# Patient Record
Sex: Male | Born: 1965 | Race: White | Hispanic: No | Marital: Single | Smoking: Never smoker
Health system: Southern US, Community
[De-identification: ages and names within clinical notes are randomized; demographics above are authoritative.]

## PROBLEM LIST (undated history)

## (undated) DIAGNOSIS — E785 Hyperlipidemia, unspecified: Secondary | ICD-10-CM

## (undated) DIAGNOSIS — I10 Essential (primary) hypertension: Secondary | ICD-10-CM

## (undated) DIAGNOSIS — H3563 Retinal hemorrhage, bilateral: Secondary | ICD-10-CM

## (undated) DIAGNOSIS — E119 Type 2 diabetes mellitus without complications: Secondary | ICD-10-CM

## (undated) DIAGNOSIS — K635 Polyp of colon: Principal | ICD-10-CM

## (undated) DIAGNOSIS — E669 Obesity, unspecified: Secondary | ICD-10-CM

## (undated) HISTORY — DX: Polyp of colon: K63.5

## (undated) HISTORY — DX: Retinal hemorrhage, bilateral: H35.63

## (undated) HISTORY — DX: Essential (primary) hypertension: I10

## (undated) HISTORY — DX: Obesity, unspecified: E66.9

## (undated) HISTORY — DX: Type 2 diabetes mellitus without complications: E11.9

## (undated) HISTORY — DX: Hyperlipidemia, unspecified: E78.5

---

## 2013-12-04 ENCOUNTER — Ambulatory Visit (INDEPENDENT_AMBULATORY_CARE_PROVIDER_SITE_OTHER): Payer: BC Managed Care – PPO | Admitting: Family Medicine

## 2013-12-04 ENCOUNTER — Encounter: Payer: Self-pay | Admitting: Family Medicine

## 2013-12-04 VITALS — BP 121/74 | HR 77 | Resp 16 | Ht 68.0 in | Wt 265.0 lb

## 2013-12-04 DIAGNOSIS — R110 Nausea: Secondary | ICD-10-CM

## 2013-12-04 DIAGNOSIS — I10 Essential (primary) hypertension: Secondary | ICD-10-CM

## 2013-12-04 DIAGNOSIS — IMO0001 Reserved for inherently not codable concepts without codable children: Secondary | ICD-10-CM

## 2013-12-04 DIAGNOSIS — E785 Hyperlipidemia, unspecified: Secondary | ICD-10-CM

## 2013-12-04 DIAGNOSIS — E1165 Type 2 diabetes mellitus with hyperglycemia: Secondary | ICD-10-CM

## 2013-12-04 MED ORDER — IRBESARTAN 150 MG PO TABS: 150.0000 mg | ORAL_TABLET | Freq: Every day | ORAL | Status: DC

## 2013-12-04 MED ORDER — PIOGLITAZONE HCL 30 MG PO TABS: 30.0000 mg | ORAL_TABLET | Freq: Every day | ORAL | Status: DC

## 2013-12-04 MED ORDER — ROSUVASTATIN CALCIUM 20 MG PO TABS: 20.0000 mg | ORAL_TABLET | Freq: Every day | ORAL | Status: DC

## 2013-12-04 MED ORDER — LIRAGLUTIDE 18 MG/3ML ~~LOC~~ SOPN: 1.8000 mg | PEN_INJECTOR | Freq: Every day | SUBCUTANEOUS | Status: DC

## 2013-12-04 MED ORDER — PROMETHAZINE HCL 12.5 MG PO TABS: ORAL_TABLET | ORAL | Status: DC

## 2013-12-04 MED ORDER — CANAGLIFLOZIN-METFORMIN HCL 150-1000 MG PO TABS: 1.0000 | ORAL_TABLET | Freq: Two times a day (BID) | ORAL | Status: DC

## 2013-12-04 MED ORDER — INSULIN PEN NEEDLE 32G X 6 MM MISC: 1.0000 "pen " | Freq: Every day | Status: AC

## 2013-12-04 NOTE — Progress Notes (Signed)
Subjective:    Patient ID: Edward Riley, male    DOB: 13-Aug-1966, 48 y.o.   MRN: [id number]  HPI  Edward Riley is here today to establish care with our practice.  He was referred to [place] by his friends [facility]).  He used to receive care by Dr. Cooper Render at the [facility].  He was dissatisfied with the care he was receiving and decided to look for another PCP.  He would like to discuss the conditions listed below:   1)  Type II DM:  He currently takes metformin (1000 mg, twice daily), glimepiride (4 mg, twice daily) and Lantus (up to 30 units daily).  His last A1c was 11 about three months.  He checks his sugars about three times daily. They usually range between 92-160 mg/dL.    2)  Hyperlipidemia:  His cholesterol levels have been abnormal.  He is currently on atorvastatin 40 mg.  His cholesterol levels have not been checked in over a year.  He does not take his medicine regularly because it gives him joint pain which is worse in his fingers.    3)  ED:  He also has experienced erectile dysfunction on occasions.     Review of Systems  Constitutional: Positive for fatigue and unexpected weight change. Negative for activity change and appetite change.  HENT: Negative.   Eyes: Negative.   Respiratory: Negative for cough, chest tightness and shortness of breath.   Cardiovascular: Negative for chest pain, palpitations and leg swelling.  Gastrointestinal: Negative for abdominal pain, diarrhea, constipation and blood in stool.  Endocrine: Negative for polydipsia, polyphagia and polyuria.  Genitourinary: Negative for difficulty urinating.  Musculoskeletal: Positive for myalgias. Negative for arthralgias and joint swelling.  Skin: Negative for rash.  Neurological: Negative for dizziness, light-headedness, numbness and headaches.  Hematological: Negative.   Psychiatric/Behavioral: Negative.     Past Medical History  Diagnosis Date  . Hyperlipidemia   . Diabetes mellitus without  complication   . Obese   . Hypertension   . Retinal hemorrhage, both eyes      History reviewed. No pertinent past surgical history.   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 Arthritis Mother   . Hyperlipidemia Mother   . Hypertension Mother   . Cancer Father 76    Prostate (Metastatic) Lung, Esophagus, Stomach  . Alcohol abuse Father   . Kidney disease Brother   . Hypertension Brother   . Cancer Paternal Aunt     Breast Cancer  . Alzheimer's disease Maternal Grandmother   . Heart disease Paternal Grandmother   . Diabetes Brother   . Alcohol abuse Maternal Uncle      No current outpatient prescriptions on file prior to visit.   No current facility-administered medications on file prior to visit.     No Known Allergies   Immunization History  Administered Date(s) Administered  . Influenza-Unspecified 08/27/2013  . Pneumococcal  Polysaccharide-23 11/27/2008      Objective:   Physical Exam  Constitutional: He appears well-nourished.  HENT:  Head: Normocephalic.  Nose: Nose normal.  Mouth/Throat: Oropharynx is clear and moist.  Eyes: Conjunctivae are normal. No scleral icterus.  Neck: Neck supple. No thyromegaly present.  Cardiovascular: Normal rate, regular rhythm and normal heart sounds.   Pulmonary/Chest: Effort normal and breath sounds normal.  Abdominal: Soft. He exhibits no mass. There is no tenderness.  Musculoskeletal: Normal range of motion.  Lymphadenopathy:    He has no cervical adenopathy.  Neurological: He is alert.  Skin: Skin is warm and dry. No rash noted.    Psychiatric: He has a normal mood and affect. His behavior is normal. Judgment and thought content normal.       Assessment & Plan:    Edward Riley was seen today for establish care.  Diagnoses and associated orders for this visit:  Other and unspecified hyperlipidemia - rosuvastatin (CRESTOR) 20 MG tablet; Take 1 tablet (20 mg total) by mouth at bedtime.  Nausea alone - promethazine (PHENERGAN) 12.5 MG tablet; Take 1/2 - 2 po q 6 hours as needed for nausea  Type II or unspecified type diabetes mellitus without mention of complication, uncontrolled - Liraglutide 18 MG/3ML SOPN; Inject 1.8 mg into the skin at bedtime. - pioglitazone (ACTOS) 30 MG tablet; Take 1 tablet (30 mg total) by mouth daily. - Canagliflozin-Metformin HCl [id number] MG TABS; Take 1 tablet by mouth 2 (two) times daily. - Insulin Pen Needle (NOVOFINE) 32G X 6 MM MISC; Inject 1 pen into the skin at bedtime.  Essential hypertension, benign - irbesartan (AVAPRO) 150 MG tablet; Take 1 tablet (150 mg total) by mouth daily.   TIME SPENT "FACE TO FACE" WITH PATIENT -  60 MINS

## 2013-12-04 NOTE — Patient Instructions (Signed)
1)  Blood Sugar  Victoza 1.8 at bedtime Invokamet - 50/1000 twice a day for 10 days then increase to the 150/1000 Actos - 15 mg at bedtime (1/2 of the 30)  The End of Diabetes (Dr. Excell Seltzer)  Exercise (1 hour per day)   ASA - 81 mg   2)  BP - Avapro 150 mg  3)  Cholesterol - Crestor 10 mg - Sample plus 1/2 of the 20 mg  4)  Nausea - Take Phenergan 12.5 - 25 if needed for nausea  Diabetes and Standards of Medical Care  Diabetes is complicated. You may find that your diabetes team includes a dietitian, nurse, diabetes educator, eye doctor, and more. To help everyone know what is going on and to help you get the care you deserve, the following schedule of care was developed to help keep you on track. Below are the tests, exams, vaccines, medicines, education, and plans you will need. HbA1c test This test shows how well you have controlled your glucose over the past 2 3 months. It is used to see if your diabetes management plan needs to be adjusted.   It is performed at least 2 times a year if you are meeting treatment goals.  It is performed 4 times a year if therapy has changed or if you are not meeting treatment goals. Blood pressure test  This test is performed at every routine medical visit. The goal is less than 140/90 mmHg for most people, but 130/80 mmHg in some cases. Ask your health care provider about your goal. Dental exam  Follow up with the dentist regularly. Eye exam  If you are diagnosed with type 1 diabetes as a child, get an exam upon reaching the age of 25 years or older and have had diabetes for 3 5 years. Yearly eye exams are recommended after that initial eye exam.  If you are diagnosed with type 1 diabetes as an adult, get an exam within 5 years of diagnosis and then yearly.  If you are diagnosed with type 2 diabetes, get an exam as soon as possible after the diagnosis and then yearly. Foot care exam  Visual foot exams are performed at every routine  medical visit. The exams check for cuts, injuries, or other problems with the feet.  A comprehensive foot exam should be done yearly. This includes visual inspection as well as assessing foot pulses and testing for loss of sensation.  Check your feet nightly for cuts, injuries, or other problems with your feet. Tell your health care provider if anything is not healing. Kidney function test (urine microalbumin)  This test is performed once a year.  Type 1 diabetes: The first test is performed 5 years after diagnosis.  Type 2 diabetes: The first test is performed at the time of diagnosis.  A serum creatinine and estimated glomerular filtration rate (eGFR) test is done once a year to assess the level of chronic kidney disease (CKD), if present. Lipid profile (cholesterol, HDL, LDL, triglycerides)  Performed every 5 years for most people.  The goal for LDL is less than 100 mg/dL. If you are at high risk, the goal is less than 70 mg/dL.  The goal for HDL is 40 mg/dL 50 mg/dL for men and 50 mg/dL 60 mg/dL for women. An HDL cholesterol of 60 mg/dL or higher gives some protection against heart disease.  The goal for triglycerides is less than 150 mg/dL. Influenza vaccine, pneumococcal vaccine, and hepatitis B vaccine  The  influenza vaccine is recommended yearly.  The pneumococcal vaccine is generally given once in a lifetime. However, there are some instances when another vaccination is recommended. Check with your health care provider.  The hepatitis B vaccine is also recommended for adults with diabetes. Diabetes self-management education  Education is recommended at diagnosis and ongoing as needed. Treatment plan  Your treatment plan is reviewed at every medical visit. Document Released: 09/10/2009 Document Revised: 07/16/2013 Document Reviewed: 04/15/2013 [facility] Patient Information 2014 [place].

## 2013-12-08 DIAGNOSIS — E1169 Type 2 diabetes mellitus with other specified complication: Secondary | ICD-10-CM | POA: Insufficient documentation

## 2013-12-08 DIAGNOSIS — E785 Hyperlipidemia, unspecified: Secondary | ICD-10-CM | POA: Insufficient documentation

## 2013-12-08 DIAGNOSIS — I152 Hypertension secondary to endocrine disorders: Secondary | ICD-10-CM | POA: Insufficient documentation

## 2013-12-08 DIAGNOSIS — R110 Nausea: Secondary | ICD-10-CM | POA: Insufficient documentation

## 2013-12-08 DIAGNOSIS — E1159 Type 2 diabetes mellitus with other circulatory complications: Secondary | ICD-10-CM | POA: Insufficient documentation

## 2013-12-08 DIAGNOSIS — I10 Essential (primary) hypertension: Secondary | ICD-10-CM

## 2014-01-05 ENCOUNTER — Ambulatory Visit (INDEPENDENT_AMBULATORY_CARE_PROVIDER_SITE_OTHER): Payer: BC Managed Care – PPO | Admitting: Family Medicine

## 2014-01-05 ENCOUNTER — Encounter: Payer: Self-pay | Admitting: Family Medicine

## 2014-01-05 VITALS — BP 144/85 | HR 105 | Resp 16 | Wt 257.0 lb

## 2014-01-05 DIAGNOSIS — IMO0001 Reserved for inherently not codable concepts without codable children: Secondary | ICD-10-CM

## 2014-01-05 DIAGNOSIS — Z5181 Encounter for therapeutic drug level monitoring: Secondary | ICD-10-CM

## 2014-01-05 DIAGNOSIS — E1165 Type 2 diabetes mellitus with hyperglycemia: Principal | ICD-10-CM

## 2014-01-05 DIAGNOSIS — I10 Essential (primary) hypertension: Secondary | ICD-10-CM

## 2014-01-05 DIAGNOSIS — E785 Hyperlipidemia, unspecified: Secondary | ICD-10-CM

## 2014-01-05 LAB — POCT GLYCOSYLATED HEMOGLOBIN (HGB A1C): Hemoglobin A1C: 8.2

## 2014-01-05 MED ORDER — CRESTOR 40 MG PO TABS: ORAL_TABLET | ORAL | Status: DC

## 2014-01-05 MED ORDER — METOPROLOL SUCCINATE ER 50 MG PO TB24: 50.0000 mg | ORAL_TABLET | Freq: Every day | ORAL | Status: DC

## 2014-01-05 MED ORDER — NEBIVOLOL HCL 5 MG PO TABS: 5.0000 mg | ORAL_TABLET | Freq: Every day | ORAL | Status: DC

## 2014-01-05 NOTE — Progress Notes (Signed)
Subjective:    Patient ID: Edward Riley, male    DOB: 05/08/1966, 48 y.o.   MRN: [id number]  HPI  Edward Riley is here today for mediation management.    1)  Type II DM:  He is doing great with the new combination of Invokamet and Victoza.  He has also been working hard on his diet and has lost 8 lbs since his last office visit.    2)  Hypertension:  His pressure remains a little elevated today.  3)  Hyperlipidemia:  He needs a prescription for Crestor.     Review of Systems  Cardiovascular: Negative for chest pain.  Endocrine: Negative for polydipsia, polyphagia and polyuria.  Neurological: Negative for light-headedness.    Past Medical History  Diagnosis Date  . Hyperlipidemia   . Diabetes mellitus without complication   . Obese   . Hypertension   . 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 Arthritis Mother   . Hyperlipidemia Mother   . Hypertension Mother   . Cancer Father [id number]    Prostate (Metastatic) Lung, Esophagus, Stomach  . Alcohol abuse Father   . Kidney disease Brother   . Hypertension Brother   . Cancer Paternal Aunt     Breast Cancer  . Alzheimer's disease Maternal Grandmother   . Heart disease Paternal Grandmother   . Diabetes Brother   . Alcohol abuse Maternal Uncle      Current Outpatient Prescriptions on File Prior to Visit  Medication Sig Dispense Refill  . Canagliflozin-Metformin HCl [phone] MG TABS Take 1 tablet by mouth 2 (two) times daily.  60 tablet  5  . Insulin Pen Needle (NOVOFINE) 32G X 6 MM MISC Inject 1 pen into the  skin at bedtime.  100 each  11  . irbesartan (AVAPRO) 150 MG tablet Take 1 tablet (150 mg total) by mouth daily.  30 tablet  5  . Liraglutide 18 MG/3ML SOPN Inject 1.8 mg into the skin at bedtime.  3 pen  5  . lisinopril (PRINIVIL,ZESTRIL) 20 MG tablet       . ONE TOUCH ULTRA TEST test strip       . pioglitazone (ACTOS) 30 MG tablet Take 1 tablet (30 mg total) by mouth daily.  30 tablet  3  . rosuvastatin (CRESTOR) 20 MG tablet Take 1 tablet (20 mg total) by mouth at bedtime.  30 tablet  0   No current facility-administered medications on file prior to visit.     No Known Allergies   Immunization History  Administered Date(s) Administered  . Influenza-Unspecified 08/27/2013  . Pneumococcal Polysaccharide-23 11/27/2008        Objective:   Physical Exam  Constitutional: He is oriented to person, place, and time. He appears well-nourished. No distress.  HENT:  Head: Normocephalic.  Eyes: No scleral icterus.  Neck: Neck supple. No thyromegaly present.  Cardiovascular: Normal rate, regular rhythm and normal heart sounds.  Exam reveals no gallop and no friction rub.   No murmur heard. Pulmonary/Chest: Breath sounds normal. No respiratory distress. He exhibits no tenderness.  Musculoskeletal: He exhibits no edema.  Neurological: He is alert and oriented to person, place, and time.  Skin: Skin is warm and dry. No rash noted.  Psychiatric: He has a normal mood and affect. His behavior is normal. Judgment and thought content normal.          Assessment & Plan:    Beaux was seen today for diabetes.  Diagnoses and associated orders for this visit:  Type II or unspecified type diabetes mellitus without mention of complication, uncontrolled Comments: His A1c is elevated at 8.2% but this is down from his last check which was 11.  He will continue to work on diet and exercise.   - POCT glycosylated hemoglobin (Hb A1C)  Other and unspecified hyperlipidemia Comments: He is  going to remain on Crestor.  We'll check a lipid panel in 3 months.   - CRESTOR 40 MG tablet; Take 1/2 - 1 tab at bedtime  Essential hypertension, benign Comments: His BP is a little elevated on his current meds.  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  He'll take Bystolic or Toprol XL.   - metoprolol succinate (TOPROL-XL) 50 MG 24 hr tablet; Take 1 tablet (50 mg total) by mouth daily. - nebivolol (BYSTOLIC) 5 MG tablet; Take 1 tablet (5 mg total) by mouth daily.  TIME SPENT "FACE TO FACE" WITH PATIENT -  30 MINS

## 2014-01-19 ENCOUNTER — Other Ambulatory Visit: Payer: Self-pay | Admitting: Family Medicine

## 2014-01-19 ENCOUNTER — Encounter: Payer: Self-pay | Admitting: Family Medicine

## 2014-01-19 ENCOUNTER — Ambulatory Visit (INDEPENDENT_AMBULATORY_CARE_PROVIDER_SITE_OTHER): Payer: BC Managed Care – PPO | Admitting: Family Medicine

## 2014-01-19 VITALS — BP 146/85 | HR 73 | Resp 16 | Wt 259.0 lb

## 2014-01-19 DIAGNOSIS — IMO0001 Reserved for inherently not codable concepts without codable children: Secondary | ICD-10-CM

## 2014-01-19 DIAGNOSIS — R809 Proteinuria, unspecified: Secondary | ICD-10-CM

## 2014-01-19 DIAGNOSIS — Z5181 Encounter for therapeutic drug level monitoring: Secondary | ICD-10-CM

## 2014-01-19 DIAGNOSIS — I10 Essential (primary) hypertension: Secondary | ICD-10-CM

## 2014-01-19 DIAGNOSIS — E785 Hyperlipidemia, unspecified: Secondary | ICD-10-CM

## 2014-01-19 DIAGNOSIS — E1165 Type 2 diabetes mellitus with hyperglycemia: Secondary | ICD-10-CM

## 2014-01-19 LAB — POCT URINALYSIS DIPSTICK
Bilirubin, UA: NEGATIVE
Glucose, UA: 2000
Ketones, UA: 15
Leukocytes, UA: NEGATIVE
Nitrite, UA: NEGATIVE
Spec Grav, UA: 1.015
Urobilinogen, UA: NEGATIVE
pH, UA: 5

## 2014-01-19 LAB — CBC WITH DIFFERENTIAL/PLATELET
Basophils Absolute: 0 10*3/uL (ref 0.0–0.1)
Basophils Relative: 0 % (ref 0–1)
Eosinophils Absolute: 0.1 10*3/uL (ref 0.0–0.7)
Eosinophils Relative: 1 % (ref 0–5)
HCT: 42.2 % (ref 39.0–52.0)
Hemoglobin: 15.1 g/dL (ref 13.0–17.0)
Lymphocytes Relative: 17 % (ref 12–46)
Lymphs Abs: 1.3 10*3/uL (ref 0.7–4.0)
MCH: 31.2 pg (ref 26.0–34.0)
MCHC: 35.8 g/dL (ref 30.0–36.0)
MCV: 87.2 fL (ref 78.0–100.0)
Monocytes Absolute: 0.5 10*3/uL (ref 0.1–1.0)
Monocytes Relative: 7 % (ref 3–12)
Neutro Abs: 5.8 10*3/uL (ref 1.7–7.7)
Neutrophils Relative %: 75 % (ref 43–77)
Platelets: 197 10*3/uL (ref 150–400)
RBC: 4.84 MIL/uL (ref 4.22–5.81)
RDW: 13.6 % (ref 11.5–15.5)
WBC: 7.7 10*3/uL (ref 4.0–10.5)

## 2014-01-19 LAB — POCT UA - MICROALBUMIN
Albumin/Creatinine Ratio, Urine, POC: 368
Creatinine, POC: 72.6 mg/dL
Microalbumin Ur, POC: 267.2 mg/L

## 2014-01-19 MED ORDER — LISINOPRIL 20 MG PO TABS: 20.0000 mg | ORAL_TABLET | Freq: Every morning | ORAL | Status: DC

## 2014-01-19 NOTE — Progress Notes (Signed)
Subjective:    Patient ID: Edward Riley, male    DOB: 12-20-1965, 48 y.o.   MRN: [id number]  HPI  Edward Riley is here today concerned about his blood pressure. He has been taking Avapro and Bystolic every evening.  He has been checking his blood pressure and has found that his pressures are all over the place.  His pressure was also elevated when he went for his DOT physical.  He denies any headaches or dizziness. He also would like to have his urine rechecked since it showed protein and ketones when he went to get his DOT physical.    Review of Systems  Cardiovascular: Negative for chest pain, palpitations and leg swelling.  Endocrine: Negative for polydipsia, polyphagia and polyuria.  Neurological: Negative for dizziness and headaches.  All other systems reviewed and are negative.     Past Medical History  Diagnosis Date  . Hyperlipidemia   . Diabetes mellitus without complication   . Obese   . Hypertension   . Retinal hemorrhage, both eyes      History reviewed. No pertinent past surgical history.   History   Social History Narrative   Marital Status: Single    Children: None   Pets: None    Living Situation: Lives alone   Occupation: Truck Sports administrator)     Education:  12 th Grade    Tobacco Use/Exposure:  None    Alcohol Use:  Rarely   Drug Use:  None   Diet:  Regular   Exercise:  Very Active at work    Hobbies:  Teacher, early years/pre                  Family History  Problem Relation Age of Onset  . Arthritis Mother   . Hyperlipidemia Mother   . Hypertension Mother   . Cancer Father 43    Prostate (Metastatic) Lung, Esophagus, Stomach  . Alcohol abuse Father   . Kidney disease Brother   . Hypertension Brother   . Cancer Paternal Aunt     Breast Cancer  . Alzheimer's disease Maternal Grandmother   . Heart disease Paternal Grandmother   . Diabetes Brother   . Alcohol abuse Maternal Uncle      Current Outpatient Prescriptions on File  Prior to Visit  Medication Sig Dispense Refill  . Canagliflozin-Metformin HCl [phone] MG TABS Take 1 tablet by mouth 2 (two) times daily.  60 tablet  5  . CRESTOR 40 MG tablet Take 1/2 - 1 tab at bedtime  30 tablet  5  . Insulin Pen Needle (NOVOFINE) 32G X 6 MM MISC Inject 1 pen into the skin at bedtime.  100 each  11  . irbesartan (AVAPRO) 150 MG tablet Take 1 tablet (150 mg total) by mouth daily.  30 tablet  5  . Liraglutide 18 MG/3ML SOPN Inject 1.8 mg into the skin at bedtime.  3 pen  5  . metoprolol succinate (TOPROL-XL) 50 MG 24 hr tablet Take 1 tablet (50 mg total) by mouth daily.  30 tablet  5  . nebivolol (BYSTOLIC) 5 MG tablet Take 1 tablet (5 mg total) by mouth daily.  30 tablet  5  . ONE TOUCH ULTRA TEST test strip       . pioglitazone (ACTOS) 30 MG tablet Take 1 tablet (30 mg total) by mouth daily.  30 tablet  3   No current facility-administered medications on file prior to visit.     No Known  Allergies   Immunization History  Administered Date(s) Administered  . Influenza-Unspecified 08/27/2013  . Pneumococcal Polysaccharide-23 11/27/2008       Objective:   Physical Exam  Nursing note and vitals reviewed. Constitutional: He is oriented to person, place, and time. He appears well-nourished. No distress.  HENT:  Head: Normocephalic.  Eyes: No scleral icterus.  Neck: Neck supple. No thyromegaly present.  Cardiovascular: Normal rate, regular rhythm and normal heart sounds.  Exam reveals no gallop and no friction rub.   No murmur heard. Pulmonary/Chest: Breath sounds normal. No respiratory distress. He exhibits no tenderness.  Musculoskeletal: He exhibits no edema.  Neurological: He is alert and oriented to person, place, and time.  Skin: Skin is warm and dry. No rash noted.  Psychiatric: He has a normal mood and affect. His behavior is normal. Judgment and thought content normal.       Assessment & Plan:    Edward Riley was seen today for  hypertension.  Diagnoses and associated orders for this visit:  Essential hypertension, benign Comments: Adding some lisinopril to lower his BP.   - lisinopril (PRINIVIL,ZESTRIL) 20 MG tablet; Take 1 tablet (20 mg total) by mouth every morning.  Proteinuria - POCT urinalysis dipstick - POCT UA - Microalbumin  Type II or unspecified type diabetes mellitus without mention of complication, uncontrolled - Insulin, fasting - C-peptide - COMPLETE METABOLIC PANEL WITH GFR - C-peptide - Insulin, fasting  Encounter for therapeutic drug monitoring - CBC w/Diff  Other and unspecified hyperlipidemia - TSH - NMR Lipoprofile with Lipids

## 2014-01-20 LAB — NMR LIPOPROFILE WITH LIPIDS
Cholesterol, Total: 140 mg/dL (ref <200)
HDL Particle Number: 26.5 umol/L — ABNORMAL LOW (ref >=30.5)
HDL Size: 8.7 nm — ABNORMAL LOW (ref >=9.2)
HDL-C: 28 mg/dL — ABNORMAL LOW (ref >=40)
LDL (calc): 67 mg/dL (ref <100)
LDL Particle Number: 1275 nmol/L — ABNORMAL HIGH (ref <1000)
LDL Size: 19.5 nm — ABNORMAL LOW (ref >20.5)
LP-IR Score: 72 — ABNORMAL HIGH (ref <=45)
Large HDL-P: <1.3 umol/L — ABNORMAL LOW (ref >=4.8)
Large VLDL-P: 4.7 nmol/L — ABNORMAL HIGH (ref <=2.7)
Small LDL Particle Number: 1116 nmol/L — ABNORMAL HIGH (ref <=527)
Triglycerides: 227 mg/dL — ABNORMAL HIGH (ref <150)
VLDL Size: 47.5 nm — ABNORMAL HIGH (ref <=46.6)

## 2014-01-20 LAB — COMPLETE METABOLIC PANEL WITH GFR
ALT: 30 U/L (ref 0–53)
AST: 23 U/L (ref 0–37)
Albumin: 4.9 g/dL (ref 3.5–5.2)
Alkaline Phosphatase: 57 U/L (ref 39–117)
BUN: 21 mg/dL (ref 6–23)
CO2: 24 mEq/L (ref 19–32)
Calcium: 9.7 mg/dL (ref 8.4–10.5)
Chloride: 99 mEq/L (ref 96–112)
Creat: 0.85 mg/dL (ref 0.50–1.35)
GFR, Est African American: >89 mL/min
GFR, Est Non African American: >89 mL/min
Glucose, Bld: 179 mg/dL — ABNORMAL HIGH (ref 70–99)
Potassium: 4.7 mEq/L (ref 3.5–5.3)
Sodium: 138 mEq/L (ref 135–145)
Total Bilirubin: 0.6 mg/dL (ref 0.2–1.2)
Total Protein: 7.2 g/dL (ref 6.0–8.3)

## 2014-01-20 LAB — INSULIN, FASTING: Insulin fasting, serum: 6 u[IU]/mL (ref 3–28)

## 2014-01-20 LAB — C-PEPTIDE: C-Peptide: 1.64 ng/mL (ref 0.80–3.90)

## 2014-01-20 LAB — TSH: TSH: 0.687 u[IU]/mL (ref 0.350–4.500)

## 2014-03-26 ENCOUNTER — Other Ambulatory Visit: Payer: BC Managed Care – PPO

## 2014-03-26 ENCOUNTER — Ambulatory Visit: Payer: BC Managed Care – PPO | Admitting: Family Medicine

## 2014-03-28 ENCOUNTER — Encounter: Payer: Self-pay | Admitting: Family Medicine

## 2014-03-30 ENCOUNTER — Other Ambulatory Visit: Payer: BC Managed Care – PPO

## 2014-03-31 ENCOUNTER — Ambulatory Visit (INDEPENDENT_AMBULATORY_CARE_PROVIDER_SITE_OTHER): Payer: BC Managed Care – PPO | Admitting: Family Medicine

## 2014-03-31 ENCOUNTER — Encounter: Payer: Self-pay | Admitting: Family Medicine

## 2014-03-31 VITALS — BP 139/84 | HR 92 | Resp 16 | Wt 250.0 lb

## 2014-03-31 DIAGNOSIS — I10 Essential (primary) hypertension: Secondary | ICD-10-CM

## 2014-03-31 DIAGNOSIS — E119 Type 2 diabetes mellitus without complications: Secondary | ICD-10-CM

## 2014-03-31 DIAGNOSIS — E785 Hyperlipidemia, unspecified: Secondary | ICD-10-CM

## 2014-03-31 LAB — POCT UA - MICROALBUMIN
Albumin/Creatinine Ratio, Urine, POC: 254.8
Creatinine, POC: 29.2 mg/dL
Microalbumin Ur, POC: 74.4 mg/L

## 2014-03-31 LAB — POCT GLYCOSYLATED HEMOGLOBIN (HGB A1C): Hemoglobin A1C: 6.8

## 2014-03-31 MED ORDER — CRESTOR 40 MG PO TABS: ORAL_TABLET | ORAL | Status: DC

## 2014-03-31 MED ORDER — LIRAGLUTIDE 18 MG/3ML ~~LOC~~ SOPN: 1.8000 mg | PEN_INJECTOR | Freq: Every day | SUBCUTANEOUS | Status: DC

## 2014-03-31 MED ORDER — PIOGLITAZONE HCL 30 MG PO TABS: 30.0000 mg | ORAL_TABLET | Freq: Every day | ORAL | Status: DC

## 2014-03-31 MED ORDER — CANAGLIFLOZIN-METFORMIN HCL 150-1000 MG PO TABS: 1.0000 | ORAL_TABLET | Freq: Two times a day (BID) | ORAL | Status: DC

## 2014-03-31 NOTE — Progress Notes (Signed)
Subjective:    Patient ID: Edward Riley, male    DOB: 03/28/1966, 48 y.o.   MRN: [id number]  HPI  Edward Riley is here today to follow up on his lab results from February.   1)  Hypertension:  He is doing well on the Lisinopril.  2)  Type II DM:  He is doing well on the Victoza, Invokonamet and Actos. His A1c today is 6.8%.  3)  Hyperlipidemia:  He is taking Crestor and is doing well on this.   Review of Systems  Constitutional: Negative for activity change and fatigue.  Cardiovascular: Negative for chest pain, palpitations and leg swelling.  Endocrine: Negative for polydipsia, polyphagia and polyuria.  Psychiatric/Behavioral: Negative for behavioral problems and sleep disturbance.  All other systems reviewed and are negative.    Past Medical History  Diagnosis Date  . Hyperlipidemia   . Diabetes mellitus without complication   . Obese   . Hypertension   . Retinal hemorrhage, both eyes      History reviewed. No pertinent past surgical history.   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 Arthritis Mother   . Hyperlipidemia Mother   . Hypertension Mother   . Cancer Father [id number]    Prostate (Metastatic) Lung, Esophagus, Stomach  . Alcohol abuse Father   . Kidney disease Brother   . Hypertension Brother   . Cancer Paternal Aunt     Breast Cancer  . Alzheimer's disease Maternal Grandmother   . Heart disease Paternal Grandmother   . Diabetes Brother   . Alcohol abuse Maternal Uncle      Current Outpatient Prescriptions on File Prior to Visit  Medication Sig Dispense Refill  .  Insulin Pen Needle (NOVOFINE) 32G X 6 MM MISC Inject 1 pen into the skin at bedtime.  100 each  11  . lisinopril (PRINIVIL,ZESTRIL) 20 MG tablet Take 1 tablet (20 mg total) by mouth every morning.  90 tablet  3  . ONE TOUCH ULTRA TEST test strip        No current facility-administered medications on file prior to visit.     No Known Allergies   Immunization History  Administered Date(s) Administered  . Influenza-Unspecified 08/27/2013  . Pneumococcal Polysaccharide-23 11/27/2008       Objective:   Physical Exam  Nursing note and vitals reviewed. Constitutional: He is oriented to person, place, and time. He appears well-nourished. No distress.  HENT:  Head: Normocephalic.  Eyes: No scleral icterus.  Neck: Neck supple. No thyromegaly present.  Cardiovascular: Normal rate, regular rhythm and normal heart sounds.  Exam reveals no gallop and no friction rub.   No murmur heard. Pulmonary/Chest: Breath sounds normal. No respiratory distress. He exhibits no tenderness.  Musculoskeletal: He exhibits no edema.  Neurological: He is alert and oriented to person, place, and time.  Skin: Skin is warm and dry. No rash noted.  Psychiatric: He  has a normal mood and affect. His behavior is normal. Judgment and thought content normal.      Assessment & Plan:    [name] was seen today for medication management.  Diagnoses and associated orders for this visit:  Type II or unspecified type diabetes mellitus without mention of complication, not stated as uncontrolled - POCT HgB A1C - POCT UA - Microalbumin - pioglitazone (ACTOS) 30 MG tablet; Take 1 tablet (30 mg total) by mouth daily. - Liraglutide 18 MG/3ML SOPN; Inject 1.8 mg into the skin at bedtime. - Canagliflozin-Metformin HCl [id number] MG TABS; Take 1 tablet by mouth 2 (two) times daily.  Essential hypertension, benign - POCT UA - Microalbumin  Other and unspecified hyperlipidemia - CRESTOR 40 MG tablet; Take 1/2 - 1 tab at  bedtime   TIME SPENT "FACE TO FACE" WITH PATIENT -  30 MINS

## 2014-03-31 NOTE — Patient Instructions (Signed)
1)  Type II DM  - Your A1c is greatly improved to 6.8%. Keep up the great work.  The protein in your urine is also greatly improved.    Proteinuria Proteinuria is a condition in which urine contains more protein than is normal. Proteinuria is either a sign that your body is producing too much protein or a sign that there is a problem with the kidneys. Healthy kidneys prevent most substances that the body needs, including proteins, from leaving the bloodstream and ending up in urine. CAUSES  Proteinuria may be caused by a temporary event or condition such as stress, exercise, or fever, and go away on its own. Proteinuria may also be a symptom of a more serious condition or disease. Causes of proteinuria include:  A kidney disease caused by:  Diabetes.  High blood pressure (hypertension).   A disease that affects the immune system, such as lupus.  A genetic disease, such as Alport's syndrome.  Medicines that damage the kidneys, such as long-term nonsteroidal anti-inflammatory drugs (NSAIDs).  Poisoning or exposure to toxic substances.  A reoccurring kidney or urinary infection.  Excess protein production in the body caused by:  Multiple myeloma.  Amyloidosis. SYMPTOMS You may have proteinuria without having noticeable symptoms. If there is a large amount of protein in your urine, your urine may look foamy. You may also notice swelling (edema) in your hands, feet, abdomen, or face. DIAGNOSIS To determine whether you have proteinuria, you will need to provide a urine sample. Your urine will then be tested for too much protein and the main blood protein albumin. If your test shows that you have proteinuria, you may need to take additional tests to determine its cause, how much protein is in your urine, and what type of protein is being lost. Tests may include:  Blood tests.  Urine tests.  A blood pressure measurement.  Imaging tests. TREATMENT  Treatment will depend on the cause  of your proteinuria. Your caregiver will discuss treatment options with you after you have been diagnosed. If your proteinuria is mild or temporary, no treatment may be necessary. HOME CARE INSTRUCTIONS Ask your caregiver if monitoring the level of protein in your urine at home using simple testing strips is appropriate for you. Early detection of proteinuria can lead to early and often successful treatment of the condition causing it. Document Released: 01/03/2006 Document Revised: 08/07/2012 Document Reviewed: 04/12/2012 [facility] Patient Information 2014 [place].

## 2014-04-02 ENCOUNTER — Ambulatory Visit: Payer: BC Managed Care – PPO | Admitting: Family Medicine

## 2014-04-14 ENCOUNTER — Ambulatory Visit: Payer: BC Managed Care – PPO | Admitting: Family Medicine

## 2014-06-30 ENCOUNTER — Ambulatory Visit: Payer: BC Managed Care – PPO | Admitting: Family Medicine

## 2014-07-27 ENCOUNTER — Other Ambulatory Visit: Payer: Self-pay | Admitting: Family Medicine

## 2014-07-27 DIAGNOSIS — E119 Type 2 diabetes mellitus without complications: Secondary | ICD-10-CM

## 2014-07-27 DIAGNOSIS — E785 Hyperlipidemia, unspecified: Secondary | ICD-10-CM

## 2014-07-27 DIAGNOSIS — I10 Essential (primary) hypertension: Secondary | ICD-10-CM

## 2014-07-27 MED ORDER — LIRAGLUTIDE 18 MG/3ML ~~LOC~~ SOPN: 1.8000 mg | PEN_INJECTOR | Freq: Every day | SUBCUTANEOUS | Status: DC

## 2014-07-27 MED ORDER — PIOGLITAZONE HCL 30 MG PO TABS: 30.0000 mg | ORAL_TABLET | Freq: Every day | ORAL | Status: DC

## 2014-07-27 MED ORDER — CRESTOR 40 MG PO TABS: ORAL_TABLET | ORAL | Status: DC

## 2014-07-27 MED ORDER — CANAGLIFLOZIN-METFORMIN HCL 150-1000 MG PO TABS: 1.0000 | ORAL_TABLET | Freq: Two times a day (BID) | ORAL | Status: DC

## 2014-07-27 NOTE — Telephone Encounter (Signed)
Edward Riley had an appointment scheduled for 06/30/14 but he cancelled that appointment.  He should have enough medications to cover him for September.  One additional month was transmitted to [facility] to give him time to get labs and follow up with me at my new practice in October.  (RZ)

## 2014-12-07 LAB — LIPID PANEL
CHOLESTEROL: 177 mg/dL (ref 0–200)
HDL: 34 mg/dL — AB (ref 35–70)
LDL Cholesterol: 107 mg/dL
Triglycerides: 181 mg/dL — AB (ref 40–160)

## 2014-12-07 LAB — CBC AND DIFFERENTIAL
Hemoglobin: 14.1 g/dL (ref 13.5–17.5)
Platelets: 192 10*3/uL (ref 150–399)
WBC: 7.5 10^3/mL

## 2014-12-07 LAB — BASIC METABOLIC PANEL
BUN: 18 mg/dL (ref 4–21)
Creatinine: 0.9 mg/dL (ref 0.6–1.3)
GLUCOSE: 228 mg/dL
POTASSIUM: 5.1 mmol/L (ref 3.4–5.3)
Sodium: 137 mmol/L (ref 137–147)

## 2014-12-07 LAB — HEPATIC FUNCTION PANEL
ALT: 19 U/L (ref 10–40)
AST: 14 U/L (ref 14–40)
Alkaline Phosphatase: 56 U/L (ref 25–125)

## 2014-12-07 LAB — HEMOGLOBIN A1C: Hgb A1c MFr Bld: 8.1 % — AB (ref 4.0–6.0)

## 2014-12-07 LAB — MICROALBUMIN / CREATININE URINE RATIO: MICROALB/CREAT RATIO: 234.9

## 2014-12-07 LAB — CALCIUM: CALCIUM: 9.9 mg/dL

## 2014-12-07 LAB — TSH: TSH: 1.22 u[IU]/mL (ref 0.41–5.90)

## 2015-05-07 ENCOUNTER — Ambulatory Visit (INDEPENDENT_AMBULATORY_CARE_PROVIDER_SITE_OTHER): Payer: BLUE CROSS/BLUE SHIELD | Admitting: Family Medicine

## 2015-05-07 ENCOUNTER — Encounter: Payer: Self-pay | Admitting: Family Medicine

## 2015-05-07 VITALS — BP 147/84 | HR 84 | Ht 69.0 in | Wt 257.0 lb

## 2015-05-07 DIAGNOSIS — E119 Type 2 diabetes mellitus without complications: Secondary | ICD-10-CM | POA: Diagnosis not present

## 2015-05-07 DIAGNOSIS — E785 Hyperlipidemia, unspecified: Secondary | ICD-10-CM

## 2015-05-07 DIAGNOSIS — N529 Male erectile dysfunction, unspecified: Secondary | ICD-10-CM | POA: Diagnosis not present

## 2015-05-07 DIAGNOSIS — I10 Essential (primary) hypertension: Secondary | ICD-10-CM

## 2015-05-07 MED ORDER — PIOGLITAZONE HCL 30 MG PO TABS: 30.0000 mg | ORAL_TABLET | Freq: Every day | ORAL | Status: DC

## 2015-05-07 MED ORDER — ATORVASTATIN CALCIUM 80 MG PO TABS: 80.0000 mg | ORAL_TABLET | Freq: Every day | ORAL | Status: DC

## 2015-05-07 MED ORDER — LISINOPRIL 20 MG PO TABS: 20.0000 mg | ORAL_TABLET | Freq: Every morning | ORAL | Status: DC

## 2015-05-07 MED ORDER — COENZYME Q-10 200 MG PO CAPS: ORAL_CAPSULE | ORAL | Status: AC

## 2015-05-07 MED ORDER — CANAGLIFLOZIN-METFORMIN HCL 150-1000 MG PO TABS: 1.0000 | ORAL_TABLET | Freq: Two times a day (BID) | ORAL | Status: DC

## 2015-05-07 MED ORDER — LIRAGLUTIDE 18 MG/3ML ~~LOC~~ SOPN: 1.8000 mg | PEN_INJECTOR | Freq: Every day | SUBCUTANEOUS | Status: DC

## 2015-05-07 MED ORDER — TADALAFIL 20 MG PO TABS: 10.0000 mg | ORAL_TABLET | ORAL | Status: DC | PRN

## 2015-05-07 NOTE — Progress Notes (Signed)
CC: Edward Riley is a 49 y.o. male is here for Establish Care   Subjective: HPI:   very pleasant 49 year old here to establish care   Reports a history of essential hypertension has been prescribed fosinopril in the past. Currently out of medication for the past month. Denies any known side effects from the medication. No outside blood pressures to report. He believes that his blood pressure has been well controlled on this medication in the past.   history of hyperlipidemia:  Was recently prescribed atorvastatin but ran out of medication one month ago. He states that all statins are causing him some mild pain in the hands but  Lipitor has caused the least amount of pain. He denies any  Other pain or right upper quadrant pain.   History of  Type 2 diabetes: He's been on Victoza,  Actos, invokamet  In the past however ran out of medication 30 days ago. No outside blood sugars to report. He tells me he feels like his blood sugar is elevated but has  No specific symptoms that  Are influencing this. Denies polyuria polyphagia or polydipsia.   In the past he has taken Cialis and would like to know if he can have refills for this. He has difficulty maintaining erections during most sexual encounters.  Review of Systems - General ROS: negative for - chills, fever, night sweats, weight gain or weight loss Ophthalmic ROS: negative for - decreased vision Psychological ROS: negative for - anxiety or depression ENT ROS: negative for - hearing change, nasal congestion, tinnitus or allergies Hematological and Lymphatic ROS: negative for - bleeding problems, bruising or swollen lymph nodes Breast ROS: negative Respiratory ROS: no cough, shortness of breath, or wheezing Cardiovascular ROS: no chest pain or dyspnea on exertion Gastrointestinal ROS: no abdominal pain, change in bowel habits, or black or bloody stools Genito-Urinary ROS: negative for - genital discharge, genital ulcers, incontinence or  abnormal bleeding from genitals Musculoskeletal ROS: negative for - joint pain or muscle painother than that described above Neurological ROS: negative for - headaches or memory loss Dermatological ROS: negative for lumps, mole changes, rash and skin lesion changes  Past Medical History  Diagnosis Date  . Hyperlipidemia   . Diabetes mellitus without complication   . Obese   . Hypertension   . Retinal hemorrhage, both eyes     History reviewed. No pertinent past surgical history. Family History  Problem Relation Age of Onset  . Arthritis Mother   . Hyperlipidemia Mother   . Hypertension Mother   . Cancer Father 58    Prostate (Metastatic) Lung, Esophagus, Stomach  . Alcohol abuse Father   . Kidney disease Brother   . Hypertension Brother   . Cancer Paternal Aunt     Breast Cancer  . Alzheimer's disease Maternal Grandmother   . Heart disease Paternal Grandmother   . Diabetes Brother   . Alcohol abuse Maternal Uncle     History   Social History  . Marital Status: Single    Spouse Name: N/A  . Number of Children: N/A  . Years of Education: 12   Occupational History  . TRUCK DRIVER      [employer]   Social History Main Topics  . Smoking status: Never Smoker   . Smokeless tobacco: Never Used  . Alcohol Use: Yes     Comment: Rarely  . Drug Use: No  . Sexual Activity: Yes   Other Topics Concern  . Not on file   Social  History Narrative   Marital Status: Single    Children: None   Pets: None    Living Situation: Lives alone   Occupation: Naval architect Research officer, trade union)     Education:  12 th Grade    Tobacco Use/Exposure:  None    Alcohol Use:  Rarely   Drug Use:  None   Diet:  Regular   Exercise:  Very Active at work    Hobbies:  Teacher, early years/pre                  Objective: BP 147/84 mmHg  Pulse 84  Ht  (1.753 m)  Wt 257 lb (116.574 kg)  BMI 37.93 kg/m2  General: Alert and Oriented, No Acute Distress HEENT: Pupils equal, round,  reactive to light. Conjunctivae clear.  Moist mucous membranes pharynx unremarkable Lungs: Clear to auscultation bilaterally, no wheezing/ronchi/rales.  Comfortable work of breathing. Good air movement. Cardiac: Regular rate and rhythm. Normal S1/S2.  No murmurs, rubs, nor gallops.   Abdomen: Normal bowel sounds, soft and non tender without palpable masses. Extremities: No peripheral edema.  Strong peripheral pulses.no swelling redness warmth of the joints in the hands  Mental Status: No depression, anxiety, nor agitation. Skin: Warm and dry.  Assessment & Plan: [name] was seen today for establish care.  Diagnoses and all orders for this visit:  Essential hypertension, benign Orders: -     lisinopril (PRINIVIL,ZESTRIL) 20 MG tablet; Take 1 tablet (20 mg total) by mouth every morning.  Hyperlipidemia Orders: -     pioglitazone (ACTOS) 30 MG tablet; Take 1 tablet (30 mg total) by mouth daily. -     Liraglutide 18 MG/3ML SOPN; Inject 0.3 mLs (1.8 mg total) into the skin at bedtime. -     Canagliflozin-Metformin HCl [id number] MG TABS; Take 1 tablet by mouth 2 (two) times daily.  Essential hypertension, benign Comments: Adding some lisinopril to lower his BP.   Orders: -     lisinopril (PRINIVIL,ZESTRIL) 20 MG tablet; Take 1 tablet (20 mg total) by mouth every morning.  Type 2 diabetes mellitus without complication  Erectile dysfunction, unspecified erectile dysfunction type  Other orders -     Coenzyme Q-10 200 MG CAPS; One by mouth daily to prevent muscle aches from statin/cholesterol medication. -     atorvastatin (LIPITOR) 80 MG tablet; Take 1 tablet (80 mg total) by mouth daily. -     tadalafil (CIALIS) 20 MG tablet; Take 0.5-1 tablets (10-20 mg total) by mouth every other day as needed for erectile dysfunction.   Essential hypertension: Uncontrolled restarting lisinopril Hyperlipidemia: Joint decision to not check cholesterol given that he has been without cholesterol  medication for a month. Starting coenzyme Q 10 to help with myalgias and joint pain of the hands. Type 2 diabetes: Suspect that his A1c will be uncontrolled today, joint decision to not check any lab work given that he has been out of medications, refills provided to restart all medications. Erectile dysfunction: Refill Cialis.  Return in about 3 months (around 08/07/2015) for sugar.

## 2015-05-10 ENCOUNTER — Telehealth: Payer: Self-pay | Admitting: Family Medicine

## 2015-05-10 DIAGNOSIS — N529 Male erectile dysfunction, unspecified: Secondary | ICD-10-CM

## 2015-05-10 NOTE — Telephone Encounter (Signed)
Received fax for prior authorization on Cialis 20 mg sent through cover my meds waiting on authorization. - CF °

## 2015-05-11 MED ORDER — SILDENAFIL CITRATE 100 MG PO TABS: 50.0000 mg | ORAL_TABLET | Freq: Every day | ORAL | Status: DC | PRN

## 2015-05-11 NOTE — Telephone Encounter (Signed)
Pt.notified

## 2015-05-11 NOTE — Telephone Encounter (Signed)
Checked prior authorization on Cover my meds and they denied coverage waiting on letter for reason why. - CF

## 2015-05-11 NOTE — Telephone Encounter (Signed)
Sue Lush, Will you please let patient know that [employer]BS has denied coverage for cialis however Viagra is on his insurance's formulary and if he wants I can send in a Rx for this, just let me know.

## 2015-05-11 NOTE — Telephone Encounter (Signed)
Rx sent to [facility] on [address].  If he has never used a savaings voucher for this medicaiton in the past he can pick up one here before picking up the Rx.

## 2015-05-11 NOTE — Telephone Encounter (Signed)
Ok with sending viagra

## 2015-05-20 ENCOUNTER — Encounter: Payer: Self-pay | Admitting: Family Medicine

## 2015-05-20 DIAGNOSIS — H3560 Retinal hemorrhage, unspecified eye: Secondary | ICD-10-CM | POA: Insufficient documentation

## 2015-05-20 DIAGNOSIS — E785 Hyperlipidemia, unspecified: Secondary | ICD-10-CM | POA: Insufficient documentation

## 2015-05-20 DIAGNOSIS — E1121 Type 2 diabetes mellitus with diabetic nephropathy: Secondary | ICD-10-CM | POA: Insufficient documentation

## 2015-05-25 ENCOUNTER — Encounter: Payer: Self-pay | Admitting: Family Medicine

## 2015-08-06 ENCOUNTER — Ambulatory Visit (INDEPENDENT_AMBULATORY_CARE_PROVIDER_SITE_OTHER): Payer: BLUE CROSS/BLUE SHIELD | Admitting: Family Medicine

## 2015-08-06 ENCOUNTER — Encounter: Payer: Self-pay | Admitting: Family Medicine

## 2015-08-06 VITALS — BP 131/71 | HR 112 | Wt 255.0 lb

## 2015-08-06 DIAGNOSIS — E1121 Type 2 diabetes mellitus with diabetic nephropathy: Secondary | ICD-10-CM | POA: Insufficient documentation

## 2015-08-06 DIAGNOSIS — I10 Essential (primary) hypertension: Secondary | ICD-10-CM

## 2015-08-06 DIAGNOSIS — Z23 Encounter for immunization: Secondary | ICD-10-CM | POA: Diagnosis not present

## 2015-08-06 DIAGNOSIS — M25511 Pain in right shoulder: Secondary | ICD-10-CM

## 2015-08-06 DIAGNOSIS — E119 Type 2 diabetes mellitus without complications: Secondary | ICD-10-CM | POA: Diagnosis not present

## 2015-08-06 DIAGNOSIS — E785 Hyperlipidemia, unspecified: Secondary | ICD-10-CM

## 2015-08-06 LAB — POCT GLYCOSYLATED HEMOGLOBIN (HGB A1C): HEMOGLOBIN A1C: 9.5

## 2015-08-06 MED ORDER — PIOGLITAZONE HCL 45 MG PO TABS: 45.0000 mg | ORAL_TABLET | Freq: Every day | ORAL | Status: DC

## 2015-08-06 NOTE — Progress Notes (Signed)
CC: Edward Riley is a 49 y.o. male is here for Diabetes   Subjective: HPI:  Follow-up essential hypertension: Continues on lisinopril daily. No outside blood pressures to report. No chest pain shortness of breath orthopnea nor peripheral edema  Follow type 2 diabetes with diabetic nephropathy: Continues on Victoza, Invokamet, and Actos. He admits that his blood sugars probably going to be elevated today because he's not been following a strict diet. Due to the stress of his mother being hospitalized for a tumor recently he's not been taking care of himself with respect to diet. Stays physically active on the job no formal exercise routine. No polyuria or polyphagia or polydipsia  Follow-up hyperlipidemia: Continues on atorvastatin and states that his shoulders are hurting him but not having any other myalgias. No right upper quadrant pain or myalgias.  Complains of right shoulder pain that has been present for matter of years but over the last few weeks it's been much more problematic. 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 Symptoms are moderate in severity and nothing other than above seems to make it better or worse. It's localized deep in the joint and radiates down the lateral side of the humerus.   Review Of Systems Outlined In HPI  Past Medical History  Diagnosis Date  . Hyperlipidemia   . Diabetes mellitus without complication   . Obese   . Hypertension   . Retinal hemorrhage, both eyes     No past surgical history on file. Family History  Problem Relation Age of Onset  . Arthritis Mother   . Hyperlipidemia Mother   . Hypertension Mother   . Cancer Father 75    Prostate (Metastatic) Lung, Esophagus, Stomach  . Alcohol abuse Father   . Kidney disease Brother   . Hypertension Brother   . Cancer Paternal Aunt     Breast Cancer  . Alzheimer's disease Maternal Grandmother   . Heart disease Paternal Grandmother   . Diabetes Brother    . Alcohol abuse Maternal Uncle     Social History   Social History  . Marital Status: Single    Spouse Name: N/A  . Number of Children: N/A  . Years of Education: 12   Occupational History  . TRUCK DRIVER      [employer]   Social History Main Topics  . Smoking status: Never Smoker   . Smokeless tobacco: Never Used  . Alcohol Use: Yes     Comment: Rarely  . Drug Use: No  . Sexual Activity: Yes   Other Topics Concern  . Not on file   Social History Narrative   Marital Status: Single    Children: None   Pets: None    Living Situation: Lives alone   Occupation: Truck Sports administrator)     Education:  12 th Grade    Tobacco Use/Exposure:  None    Alcohol Use:  Rarely   Drug Use:  None   Diet:  Regular   Exercise:  Very Active at work    Hobbies:  Teacher, early years/pre                  Objective: BP 131/71 mmHg  Pulse 112  Wt 255 lb (115.667 kg)  General: Alert and Oriented, No Acute Distress HEENT: Pupils equal, round, reactive to light. Conjunctivae clear.   Lungs: Clear to auscultation bilaterally, no wheezing/ronchi/rales.  Comfortable work of breathing. Good air movement. Cardiac: Regular rate  and rhythm. Normal S1/S2.  No murmurs, rubs, nor gallops.   Right shoulder exam reveals full range of motion and strength in all planes of motion and with individual rotator cuff testing. No overlying redness warmth or swelling.  Neer's test positive.  Hawkins test positive. Empty can negative. Crossarm test negative. O'Brien's test negative. Apprehension test negative. Speed's test negative. Extremities: No peripheral edema.  Strong peripheral pulses.  Mental Status: No depression, anxiety, nor agitation. Skin: Warm and dry.  Assessment & Plan: Xavious was seen today for diabetes.  Diagnoses and all orders for this visit:  Essential hypertension, benign  Microalbuminuric diabetic nephropathy  Type 2 diabetes mellitus without complication -      COMPLETE METABOLIC PANEL WITH GFR -     pioglitazone (ACTOS) 45 MG tablet; Take 1 tablet (45 mg total) by mouth daily. -     POCT HgB A1C  Hyperlipidemia -     Lipid panel -     COMPLETE METABOLIC PANEL WITH GFR  Right shoulder pain  Other orders -     Flu Vaccine QUAD 36+ mos IM   Essential hypertension: Controlled continue lisinopril Type 2 diabetes with diabetic nephropathy: He is RE on a ACE inhibitor, we will increase Actos since his A1c was 9.8 today and he believes he can have some success with focusing on carbohydrate restriction in his diet. He'll continue on Victoza and invokamet. Hyperlipidemia: 2 for lipid panel and liver function Right shoulder pain: Discussed home exercises and offered him a cortisone injection which she would like to have done today  Subacromial Shoulder Injection Procedure Note  Pre-operative Diagnosis: right shoulder  Post-operative Diagnosis: same  Indications: pain  Anesthesia: topical cold spray  Procedure Details   Verbal consent was obtained for the procedure. The shoulder was prepped with Betadine and the skin was anesthetized. A 27 gauge needle was advanced into the subacromial space through posterior approach without difficulty  The space was then injected with 2 ml 1% lidocaine and 2 ml of triamcinolone (KENALOG) 40mg /ml. The injection site was cleansed with isopropyl alcohol and a dressing was applied.  Complications:  None; patient tolerated the procedure well.   Return in about 3 months (around 11/05/2015).

## 2015-09-17 ENCOUNTER — Telehealth: Payer: Self-pay | Admitting: Family Medicine

## 2015-09-17 NOTE — Telephone Encounter (Signed)
Received fax for prior authorization on Victoza sent through cover my meds waiting on authorization. - CF °

## 2015-09-20 NOTE — Telephone Encounter (Signed)
Pharmacy was running medication under [employer] and patient has BCBS which does not require a prior authorization. Pharmacy reran the medication and it went through. - CF

## 2015-10-12 ENCOUNTER — Telehealth: Payer: Self-pay | Admitting: Family Medicine

## 2015-10-12 NOTE — Telephone Encounter (Signed)
Received fax for prior authorization on Cialis 20 mg sent through cover my meds waiting on authorization. - CF

## 2015-10-19 NOTE — Telephone Encounter (Signed)
[facility] approved Cialis from 05/10/2015 - 11/26/2038. Reference # [id number]. - CF

## 2015-10-20 ENCOUNTER — Telehealth: Payer: Self-pay | Admitting: Family Medicine

## 2015-10-20 MED ORDER — TADALAFIL 20 MG PO TABS: 10.0000 mg | ORAL_TABLET | ORAL | Status: DC | PRN

## 2015-10-20 NOTE — Telephone Encounter (Signed)
[place], Rx placed in in-box ready for pickup/faxing.  

## 2015-10-20 NOTE — Telephone Encounter (Signed)
Rx faxed

## 2015-11-05 ENCOUNTER — Ambulatory Visit: Payer: BLUE CROSS/BLUE SHIELD | Admitting: Family Medicine

## 2015-12-02 ENCOUNTER — Encounter: Payer: Self-pay | Admitting: Family Medicine

## 2015-12-06 ENCOUNTER — Other Ambulatory Visit: Payer: Self-pay

## 2015-12-06 ENCOUNTER — Ambulatory Visit (INDEPENDENT_AMBULATORY_CARE_PROVIDER_SITE_OTHER): Payer: 59

## 2015-12-06 ENCOUNTER — Ambulatory Visit (INDEPENDENT_AMBULATORY_CARE_PROVIDER_SITE_OTHER): Payer: 59 | Admitting: Family Medicine

## 2015-12-06 ENCOUNTER — Encounter: Payer: Self-pay | Admitting: Family Medicine

## 2015-12-06 VITALS — BP 138/87 | HR 97 | Wt 255.0 lb

## 2015-12-06 DIAGNOSIS — I10 Essential (primary) hypertension: Secondary | ICD-10-CM | POA: Diagnosis not present

## 2015-12-06 DIAGNOSIS — Z8042 Family history of malignant neoplasm of prostate: Secondary | ICD-10-CM | POA: Diagnosis not present

## 2015-12-06 DIAGNOSIS — M25511 Pain in right shoulder: Secondary | ICD-10-CM

## 2015-12-06 DIAGNOSIS — E785 Hyperlipidemia, unspecified: Secondary | ICD-10-CM

## 2015-12-06 DIAGNOSIS — M25512 Pain in left shoulder: Principal | ICD-10-CM

## 2015-12-06 DIAGNOSIS — E1121 Type 2 diabetes mellitus with diabetic nephropathy: Secondary | ICD-10-CM | POA: Diagnosis not present

## 2015-12-06 DIAGNOSIS — M75102 Unspecified rotator cuff tear or rupture of left shoulder, not specified as traumatic: Secondary | ICD-10-CM

## 2015-12-06 LAB — POCT GLYCOSYLATED HEMOGLOBIN (HGB A1C): HEMOGLOBIN A1C: 9.6

## 2015-12-06 MED ORDER — CANAGLIFLOZIN-METFORMIN HCL 150-1000 MG PO TABS: 1.0000 | ORAL_TABLET | Freq: Two times a day (BID) | ORAL | Status: DC

## 2015-12-06 NOTE — Progress Notes (Signed)
CC: Edward Riley is a 50 y.o. male is here for Hyperglycemia   Subjective: HPI:   Follow-up essential hypertension: Continues to take lisinopril on a daily basis. No outside blood pressures report. Denies chest pain shortness of breath orthopnea nor peripheral edema.  Follow-up type 2 diabetes: Continues to take Actos, full dose victoza and full dose Invokamet.  He admits that he is not been able to follow a strict diet has been eating fast food and junk food because it's convenient while working. He denies polyuria polyphagia or polydipsia.  Complains of bilateral shoulder pain. It's been present for matter of years now. A steroid injection given to him by myself last visit was mildly beneficial. Pain is worse with pulling himself up, lifting objects above his head or sleeping on either shoulder. No benefit from over-the-counter anti-inflammatories. He's never had x-rays done nor does he recall any trauma. Pain is moderate in severity and sometimes he feels like he needs to cry because of it. Denies swelling redness or warmth of either shoulder   Review Of Systems Outlined In HPI  Past Medical History  Diagnosis Date  . Hyperlipidemia   . Diabetes mellitus without complication (HCC)   . Obese   . Hypertension   . Retinal hemorrhage, both eyes     No past surgical history on file. Family History  Problem Relation Age of Onset  . Arthritis Mother   . Hyperlipidemia Mother   . Hypertension Mother   . Cancer Father 58    Prostate (Metastatic) Lung, Esophagus, Stomach  . Alcohol abuse Father   . Kidney disease Brother   . Hypertension Brother   . Cancer Paternal Aunt     Breast Cancer  . Alzheimer's disease Maternal Grandmother   . Heart disease Paternal Grandmother   . Diabetes Brother   . Alcohol abuse Maternal Uncle     Social History   Social History  . Marital Status: Single    Spouse Name: N/A  . Number of Children: N/A  . Years of Education: 12   Occupational  History  . TRUCK DRIVER      [employer]   Social History Main Topics  . Smoking status: Never Smoker   . Smokeless tobacco: Never Used  . Alcohol Use: Yes     Comment: Rarely  . Drug Use: No  . Sexual Activity: Yes   Other Topics Concern  . Not on file   Social History Narrative   Marital Status: Single    Children: None   Pets: None    Living Situation: Lives alone   Occupation: Truck Sports administrator)     Education:  12 th Grade    Tobacco Use/Exposure:  None    Alcohol Use:  Rarely   Drug Use:  None   Diet:  Regular   Exercise:  Very Active at work    Hobbies:  Teacher, early years/pre                  Objective: BP 138/87 mmHg  Pulse 97  Wt 255 lb (115.667 kg)  General: Alert and Oriented, No Acute Distress HEENT: Pupils equal, round, reactive to light. Conjunctivae clear. Moist mucous membranes Lungs: Clear to auscultation bilaterally, no wheezing/ronchi/rales.  Comfortable work of breathing. Good air movement. Cardiac: Regular rate and rhythm. Normal S1/S2.  No murmurs, rubs, nor gallops.   Right shoulder exam reveals full range of motion and strength in all planes of motion and with individual rotator cuff  testing. No overlying redness warmth or swelling. Neer's test positive. Hawkins test positive. Empty can negative. Crossarm test negative. O'Brien's test negative. Apprehension test negative. Speed's test negative. Left Shoulder Exam:  full range of motion and strength in all planes of motion and with individual rotator cuff testing. No overlying redness warmth or swelling. Neer's test positive. Hawkins test positive. Empty can negative. Crossarm test negative. O'Brien's test negative. Apprehension test negative. Speed's test negative. Extremities: No peripheral edema.  Strong peripheral pulses.  Mental Status: No depression, anxiety, nor agitation. Skin: Warm and dry.  Assessment & Plan: Edward Riley was seen today for hyperglycemia.  Diagnoses and  all orders for this visit:  Essential hypertension, benign  Type 2 diabetes mellitus with diabetic nephropathy, without long-term current use of insulin (HCC)  Bilateral shoulder pain -     DG Shoulder Right; Future -     DG Shoulder Left; Future  Family hx of prostate cancer   Essential hypertension: Controlled continue lisinopril Type 2 diabetes with an A1c of 9.8, uncontrolled, uppermost adding a fourth agent to his regimen however he is adamant that his uncontrolled diabetes due to poor diet. He tells me he is going to work harder at trying to reduce carbohydrates in his diet and eat more salads. Bilateral shoulder pain: Obtained bilateral x-rays of the shoulders for further evaluation of pain. History of prostate cancer in his father somewhere in the late 5450s of age. When he returns for a complete physical exam will do a PSA.  Return in about 3 months (around 03/05/2016) for Complete Physical.

## 2015-12-06 NOTE — Addendum Note (Signed)
Addended by: Thom ChimesHENRY, Jarrid Lienhard M on: 12/06/2015 03:55 PM   Modules accepted: Orders

## 2015-12-07 ENCOUNTER — Encounter: Payer: Self-pay | Admitting: Sports Medicine

## 2015-12-07 ENCOUNTER — Other Ambulatory Visit: Payer: Self-pay | Admitting: *Deleted

## 2015-12-07 ENCOUNTER — Ambulatory Visit (INDEPENDENT_AMBULATORY_CARE_PROVIDER_SITE_OTHER): Payer: 59 | Admitting: Sports Medicine

## 2015-12-07 VITALS — BP 124/77 | HR 98 | Resp 18 | Wt 255.0 lb

## 2015-12-07 DIAGNOSIS — M7541 Impingement syndrome of right shoulder: Secondary | ICD-10-CM | POA: Insufficient documentation

## 2015-12-07 DIAGNOSIS — M7542 Impingement syndrome of left shoulder: Secondary | ICD-10-CM

## 2015-12-07 NOTE — Progress Notes (Signed)
   Subjective:    I'm seeing this patient as a consultation for:  Dr. Ivan AnchorsHommel  CC: Bilateral shoulder pain  HPI: For months this pleasant 50 year old male has had pain he localizes on the deltoid of both shoulders, worse with overhead activities, moderate, persistent. No radiation past the elbow, no trauma. He did have a subacromial injection in the left shoulder sometime ago that provided temporary response. Has never had formal physical therapy.  Past medical history, Surgical history, Family history not pertinant except as noted below, Social history, Allergies, and medications have been entered into the medical record, reviewed, and no changes needed.   Review of Systems: No headache, visual changes, nausea, vomiting, diarrhea, constipation, dizziness, abdominal pain, skin rash, fevers, chills, night sweats, weight loss, swollen lymph nodes, body aches, joint swelling, muscle aches, chest pain, shortness of breath, mood changes, visual or auditory hallucinations.   Objective:   General: Well Developed, well nourished, and in no acute distress.  Neuro/Psych: Alert and oriented x3, extra-ocular muscles intact, able to move all 4 extremities, sensation grossly intact. Skin: Warm and dry, no rashes noted.  Respiratory: Not using accessory muscles, speaking in full sentences, trachea midline.  Cardiovascular: Pulses palpable, no extremity edema. Abdomen: Does not appear distended. Bilateral Shoulder: Inspection reveals no abnormalities, atrophy or asymmetry. Palpation is normal with no tenderness over AC joint or bicipital groove. ROM is full in all planes. Rotator cuff strength normal throughout. Positive Neer and Hawkin's tests, empty can. Speeds and Yergason's tests normal. No labral pathology noted with negative Obrien's, negative crank, negative clunk, and good stability. Normal scapular function observed. No painful arc and no drop arm sign. No apprehension sign  Procedure:  Real-time Ultrasound Guided Injection of left subacromial bursa Device: GE Logiq E  Verbal informed consent obtained.  Time-out conducted.  Noted no overlying erythema, induration, or other signs of local infection.  Skin prepped in a sterile fashion.  Local anesthesia: Topical Ethyl chloride.  With sterile technique and under real time ultrasound guidance:  1 mL Kenalog free, 3 mL lidocaine injected easily. Completed without difficulty  Pain immediately resolved suggesting accurate placement of the medication.  Advised to call if fevers/chills, erythema, induration, drainage, or persistent bleeding.  Images permanently stored and available for review in the ultrasound unit.  Impression: Technically successful ultrasound guided injection.  Procedure: Real-time Ultrasound Guided Injection of right subacromial bursa Device: GE Logiq E  Verbal informed consent obtained.  Time-out conducted.  Noted no overlying erythema, induration, or other signs of local infection.  Skin prepped in a sterile fashion.  Local anesthesia: Topical Ethyl chloride.  With sterile technique and under real time ultrasound guidance:  1 mL Kenalog free, 3 mL lidocaine injected easily. Completed without difficulty  Pain immediately resolved suggesting accurate placement of the medication.  Advised to call if fevers/chills, erythema, induration, drainage, or persistent bleeding.  Images permanently stored and available for review in the ultrasound unit.  Impression: Technically successful ultrasound guided injection.  Impression and Recommendations:   This case required medical decision making of moderate complexity.

## 2015-12-07 NOTE — Assessment & Plan Note (Addendum)
Bilateral subacromial injections. Ultrasound guidance was used. Formal physical therapy, return to see me in one month, referral for subacromial decompression if no better. There did appear to be calcific tendinitis of the left supraspinatus, this can be a target for barbotage if no better with a simple injection and PT.

## 2015-12-13 ENCOUNTER — Ambulatory Visit: Payer: 59 | Admitting: Rehabilitative and Restorative Service Providers"

## 2015-12-14 ENCOUNTER — Encounter: Payer: Self-pay | Admitting: Rehabilitative and Restorative Service Providers"

## 2015-12-14 ENCOUNTER — Ambulatory Visit (INDEPENDENT_AMBULATORY_CARE_PROVIDER_SITE_OTHER): Payer: 59 | Admitting: Rehabilitative and Restorative Service Providers"

## 2015-12-14 DIAGNOSIS — R531 Weakness: Secondary | ICD-10-CM

## 2015-12-14 DIAGNOSIS — Z7409 Other reduced mobility: Secondary | ICD-10-CM | POA: Diagnosis not present

## 2015-12-14 DIAGNOSIS — R29898 Other symptoms and signs involving the musculoskeletal system: Secondary | ICD-10-CM | POA: Diagnosis not present

## 2015-12-14 DIAGNOSIS — R6889 Other general symptoms and signs: Secondary | ICD-10-CM

## 2015-12-14 DIAGNOSIS — M623 Immobility syndrome (paraplegic): Secondary | ICD-10-CM | POA: Diagnosis not present

## 2015-12-14 DIAGNOSIS — R293 Abnormal posture: Secondary | ICD-10-CM | POA: Diagnosis not present

## 2015-12-14 DIAGNOSIS — M2560 Stiffness of unspecified joint, not elsewhere classified: Secondary | ICD-10-CM

## 2015-12-14 NOTE — Patient Instructions (Signed)
Axial Extension (Chin Tuck)    Pull chin in and lengthen back of neck. Hold __10-15__ seconds while counting out loud. Repeat __5-10__ times. Do _several___ sessions per day.   Shoulder Blade Squeeze    Rotate shoulders back, then squeeze shoulder blades down and back Hold 10 sec Repeat __10__ times. Do _several ___ sessions per day. Can use swim noodle along spine  Lying on back with swim noodle along your spine  Arms out to side in position to give you some stretch Hold 5 min  Can bend elbow to relax stretch if needed then straighten arm again  Scapula Adduction With Pectoralis Stretch: Low - Standing   Shoulders at 45 hands even with shoulders, keeping weight through legs, shift weight forward until you feel pull or stretch through the front of your chest. Hold _30__ seconds. Do _3__ times, _2-4__ times per day.   Scapula Adduction With Pectoralis Stretch: Mid-Range - Standing   Shoulders at 90 elbows even with shoulders, keeping weight through legs, shift weight forward until you feel pull or strength through the front of your chest. Hold __30_ seconds. Do _3__ times, __2-4_ times per day.   Scapula Adduction With Pectoralis Stretch: High - Standing   Shoulders at 120 hands up high on the doorway, keeping weight on feet, shift weight forward until you feel pull or stretch through the front of your chest. Hold _30__ seconds. Do _3__ times, _2-3__ times per day.

## 2015-12-14 NOTE — Therapy (Signed)
[facility] [place] [address] [place], [place], [address] Phone: [phone]   Fax:  [phone]  Physical Therapy Evaluation  Patient Details  Name: Edward Riley MRN: [id number] Date of Birth: [birth year] Referring Provider: Dr. Benjamin Stain  Encounter Date: 12/14/2015      PT End of Session - 12/14/15 0904    Visit Number 1   Number of Visits 12   Date for PT Re-Evaluation 01/25/16   PT Start Time 0808   PT Stop Time 0900   PT Time Calculation (min) 52 min   Activity Tolerance Patient tolerated treatment well      Past Medical History  Diagnosis Date  . Hyperlipidemia   . Diabetes mellitus without complication (HCC)   . Obese   . Hypertension   . Retinal hemorrhage, both eyes     History reviewed. No pertinent past surgical history.  There were no vitals filed for this visit.  Visit Diagnosis:  Abnormal posture - Plan: PT plan of care cert/re-cert  Stiffness due to immobility - Plan: PT plan of care cert/re-cert  Weakness of shoulder - Plan: PT plan of care cert/re-cert  Decreased strength, endurance, and mobility - Plan: PT plan of care cert/re-cert      Subjective Assessment - 12/14/15 0811    Subjective patient reports thta he started having pain in the Rt shoulder about a year ago. He had pain in the shoulder shooting down into the arm. He then began to notice pain in the Lt shoulder with radiating pain into the Lt arm. He received an injection in the Rt shd  ~6 months ago. He was seen by Dr T last week and received injections in bilat shds with improvement in shooting pain but he continues to have a "dull pain " in both shoulders. He is now on light duty at work.    Pertinent History denies any musculoskeletal problems. Has AODM; HTN   Diagnostic tests xrays   Patient Stated Goals get rid of the pain    Currently in Pain? Yes   Pain Score 2    Pain Location Shoulder   Pain Orientation Left   Pain  Descriptors / Indicators Dull   Pain Type Chronic pain   Pain Onset More than a month ago   Pain Frequency Intermittent  aware of pain most of the time    Aggravating Factors  lifting; pulling; climbing onto the back of the truck; certain movements of arm; shifting gears in truck    Pain Relieving Factors injections; rest; OTC meds slight relief    Multiple Pain Sites Yes   Pain Score 1   Pain Location Shoulder   Pain Orientation Right   Pain Descriptors / Indicators Dull   Pain Type Chronic pain   Pain Onset More than a month ago   Pain Frequency Intermittent  present most of the time   Aggravating Factors  lifting; pulling; changing gears in his truck   Pain Relieving Factors injections; heat; OTC meds             [facility] PT Assessment - 12/14/15 0001    Assessment   Medical Diagnosis Impingement syndrome bilat shds   Referring Provider Dr. Benjamin Stain   Onset Date/Surgical Date 05/12/15   Hand Dominance Right   Next MD Visit 01/04/16   Prior Therapy none   Precautions   Precaution Comments light duty - limited lifting no more than 25#; no standing or walking more than 20 min  Balance Screen   Has the patient fallen in the past 6 months Yes   How many times? 1   Has the patient had a decrease in activity level because of a fear of falling?  No   Is the patient reluctant to leave their home because of a fear of falling?  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; reaching; climbing onto truck; driving; picking shoots/moving shoots; climbing up ladder on truck    Leisure work on race cars; cooking    Observation/Other Assessments   Focus on Therapeutic Outcomes (FOTO)  37% limitation    Posture/Postural Control   Posture Comments head forward; shoulders rounded and  elevated; head of the humer anterior in orientation; scapulae abducted and rotated along the thoracic spine; increased thoracic kyphosis    AROM   Right Shoulder Extension 41 Degrees   Right Shoulder Flexion 130 Degrees   Right Shoulder ABduction 136 Degrees   Right Shoulder Internal Rotation 9 Degrees   Right Shoulder External Rotation 53 Degrees   Left Shoulder Extension 47 Degrees  pain    Left Shoulder Flexion 128 Degrees   Left Shoulder ABduction 130 Degrees  pain   Left Shoulder Internal Rotation 2 Degrees   Left Shoulder External Rotation 30 Degrees   Cervical Flexion 42   Cervical Extension 32   Cervical - Right Side Bend 30   Cervical - Left Side Bend 32   Cervical - Right Rotation 63   Cervical - Left Rotation 61   Strength   Overall Strength Comments 5/5 bilat shds except ER/IR 4/5 pain with Lt ER > IR; Rt IR > ER    Palpation   Palpation comment tight ant shoulder/pecs/upper trap/leveator   Special Tests    Special Tests --  + empty can; hawkins-kennedy                   [facility] Adult PT Treatment/Exercise - 12/14/15 0001    Therapeutic Activites    Therapeutic Activities --  myofacial ball release work    Neuro Re-ed    Neuro Re-ed Details  postural correction working to engage posterior shoulder girdle musculature    Shoulder Exercises: Standing   Retraction Limitations scap squeeze w/noodle 10 sec x 10    Other Standing Exercises axial extension 10 sec x 5    Shoulder Exercises: Stretch   Corner Stretch Limitations 3way doorway 30 sec x2    Other Shoulder Stretches supine snow angel with noodle arms ~80 deg 2-3 min                 PT Education - 12/14/15 0900    Education provided Yes   Education Details posture/alignment; HEP   Person(s) Educated Patient   Methods Explanation;Demonstration;Tactile cues;Verbal cues;Handout   Comprehension Verbalized understanding;Returned demonstration;Verbal cues required;Tactile cues required              PT Long Term Goals - 12/14/15 0913    PT LONG TERM GOAL #1   Title Improve posture and alignment with pt to demo good position of scapulae along thoracic wall and head of humerus in improved alignment 01/25/16   Time 6   Period Weeks   Status New   PT LONG TERM GOAL #2  Title Increase ROM bilat shds by 10-20 degrees throughout 01/25/16   Time 6   Period Weeks   Status New   PT LONG TERM GOAL #3   Title 5/5 strength bilat shd ER/IR 01/25/16   Time 6   Period Weeks   Status New   PT LONG TERM GOAL #4   Title I in HEP 01/25/16   Time 6   Period Weeks   Status New   PT LONG TERM GOAL #5   Title Improve FOTO to </= 27% limitaion 01/25/16   Time 6   Period Weeks   Status New               Plan - 12/14/15 0905    Clinical Impression Statement Patient presents with signs and symptoms consistent with impingement syndrome bilat shds. He has poor posture and alignment; decreased ROM; decreased strength in shoulder ER/IR; abnormal movement patterns; muscular tightness to palpation; pain; limited functional activites. Pt will benefit from PT to address problems identified.    Pt will benefit from skilled therapeutic intervention in order to improve on the following deficits Postural dysfunction;Improper body mechanics;Increased fascial restricitons;Decreased range of motion;Decreased strength;Decreased activity tolerance;Pain   Rehab Potential Good   PT Frequency 2x / week   PT Duration 6 weeks   PT Treatment/Interventions Patient/family education;ADLs/Self Care Home Management;Therapeutic exercise;Therapeutic activities;Manual techniques;Dry needling;Cryotherapy;Electrical Stimulation;Moist Heat;Ultrasound;Neuromuscular re-education   PT Next Visit Plan postural correction; stretching biceps/ant shd/chest; posterior shoulder girdle; modalities and manual work as indicated; TDN as indicated to assist in decreasing tightness through anterior shd/chest/traps/etc..Marland KitchenStretch  shoulders   PT Home Exercise Plan postural correction; HEP          Nickname = [name]   Consulted and Agree with Plan of Care Patient         Problem List Patient Active Problem List   Diagnosis Date Noted  . Impingement syndrome of both shoulders 12/07/2015  . Family hx of prostate cancer 12/06/2015  . Type 2 diabetes mellitus with diabetic nephropathy (HCC) 08/06/2015  . Hyperlipidemia 05/20/2015  . Retinal hemorrhage 05/20/2015  . 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 Erectile dysfunction 05/07/2015  . Other and unspecified hyperlipidemia 12/08/2013  . Nausea alone 12/08/2013  . Essential hypertension, benign 12/08/2013    Celyn Rober Minion PT, MPH  12/14/2015, 9:19 AM  [facility] [address] [place], [place], [address] Phone: [phone]   Fax:  [phone]  Name: Edward Riley MRN: [id number] Date of Birth: June 11, 1966

## 2015-12-17 ENCOUNTER — Ambulatory Visit (INDEPENDENT_AMBULATORY_CARE_PROVIDER_SITE_OTHER): Payer: 59 | Admitting: Physical Therapy

## 2015-12-17 ENCOUNTER — Encounter: Payer: Self-pay | Admitting: Physical Therapy

## 2015-12-17 DIAGNOSIS — R29898 Other symptoms and signs involving the musculoskeletal system: Secondary | ICD-10-CM | POA: Diagnosis not present

## 2015-12-17 DIAGNOSIS — M2560 Stiffness of unspecified joint, not elsewhere classified: Secondary | ICD-10-CM

## 2015-12-17 DIAGNOSIS — Z7409 Other reduced mobility: Secondary | ICD-10-CM

## 2015-12-17 DIAGNOSIS — M623 Immobility syndrome (paraplegic): Secondary | ICD-10-CM

## 2015-12-17 DIAGNOSIS — R293 Abnormal posture: Secondary | ICD-10-CM | POA: Diagnosis not present

## 2015-12-17 NOTE — Therapy (Signed)
[place], [place], [address] Phone: [phone]   Fax:  [phone]  Physical Therapy Treatment  Patient Details  Name: Edward Riley MRN: [id number] Date of Birth: October 19, 1966 Referring Provider: Dr. Dianah Field  Encounter Date: 12/17/2015      PT End of Session - 12/17/15 0802    Visit Number 2   Number of Visits 12   Date for PT Re-Evaluation 01/25/16   PT Start Time 0802   PT Stop Time 0846   PT Time Calculation (min) 44 min      Past Medical History  Diagnosis Date  . Hyperlipidemia   . Diabetes mellitus without complication ([place])   . Obese   . Hypertension   . Retinal hemorrhage, both eyes     History reviewed. No pertinent past surgical history.  There were no vitals filed for this visit.  Visit Diagnosis:  Abnormal posture  Stiffness due to immobility  Weakness of shoulder      Subjective Assessment - 12/17/15 0803    Subjective Pt reports he is doing ok, working on his HEP.    Currently in Pain? Yes   Pain Score 2    Pain Location Shoulder   Pain Orientation Left;Right   Pain Descriptors / Indicators Dull   Pain Type Chronic pain                         [facility] Adult PT Treatment/Exercise - 12/17/15 0001    Shoulder Exercises: Supine   Other Supine Exercises on 1/2bolster, 2x10, red bandoverhead, horizontal abd, SASH, ER.   Shoulder Exercises: Seated   Row Strengthening;Both;20 reps;Theraband   Theraband Level (Shoulder Row) Level 2 (Red)   Shoulder Exercises: ROM/Strengthening   UBE (Upper Arm Bike) L2x4' alt FWD/BWD   Shoulder Exercises: Stretch   Corner Stretch Limitations doorway stretch   Internal Rotation Stretch 30 seconds  with strap bilat   Internal Rotation Stretch Limitations VC for form   Manual Therapy   Manual Therapy Joint mobilization;Passive ROM   Joint Mobilization post GH mobs bilat, grade III   Passive ROM bilat  shoulder stretches into ER/IR                 PT Education - 12/17/15 0818    Education provided Yes   Education Details discussed TDN, pt not a fan of needles, he will think about it.    Person(s) Educated Patient   Methods Explanation;Handout   Comprehension Verbalized understanding             PT Long Term Goals - 12/14/15 0913    PT LONG TERM GOAL #1   Title Improve posture and alignment with pt to demo good position of scapulae along thoracic wall and head of humerus in improved alignment 01/25/16   Time 6   Period Weeks   Status New   PT LONG TERM GOAL #2   Title Increase ROM bilat shds by 10-20 degrees throughout 01/25/16   Time 6   Period Weeks   Status New   PT LONG TERM GOAL #3   Title 5/5 strength bilat shd ER/IR 01/25/16   Time 6   Period Weeks   Status New   PT LONG TERM GOAL #4   Title I in HEP 01/25/16   Time 6   Period Weeks   Status New   PT LONG TERM GOAL #5   Title Improve FOTO to </= 27%  limitaion 01/25/16   Time 6   Period Weeks   Status New               Plan - 12/17/15 0817    Clinical Impression Statement This is [name]'s second visit, no goals met.  Discussed the benefits of TDN, pt not sure he wants that.  He will think about it.    Pt will benefit from skilled therapeutic intervention in order to improve on the following deficits Postural dysfunction;Improper body mechanics;Increased fascial restricitons;Decreased range of motion;Decreased strength;Decreased activity tolerance;Pain   Rehab Potential Good   PT Frequency 2x / week   PT Duration 6 weeks   PT Treatment/Interventions Patient/family education;ADLs/Self Care Home Management;Therapeutic exercise;Therapeutic activities;Manual techniques;Dry needling;Cryotherapy;Electrical Stimulation;Moist Heat;Ultrasound;Neuromuscular re-education   PT Next Visit Plan postural correction; stretching biceps/ant shd/chest; posterior shoulder girdle; modalities and manual work as  indicated; TDN as indicated to assist in decreasing tightness through anterior shd/chest/traps/etc..Marland KitchenStretch shoulders   Consulted and Agree with Plan of Care Patient        Problem List Patient Active Problem List   Diagnosis Date Noted  . Impingement syndrome of both shoulders 12/07/2015  . Family hx of prostate cancer 12/06/2015  . Type 2 diabetes mellitus with diabetic nephropathy ([place]) 08/06/2015  . Hyperlipidemia 05/20/2015  . Retinal hemorrhage 05/20/2015  . Microalbuminuric diabetic nephropathy ([place]) 05/20/2015  . Erectile dysfunction 05/07/2015  . Other and unspecified hyperlipidemia 12/08/2013  . Nausea alone 12/08/2013  . Essential hypertension, benign 12/08/2013    Jeral Pinch PT 12/17/2015, 8:53 AM  [facility] [place], [place], [address] Phone: [phone]   Fax:  [phone]  Name: Egidio Lofgren MRN: [id number] Date of Birth: 10/15/1966

## 2015-12-17 NOTE — Patient Instructions (Signed)

## 2015-12-20 ENCOUNTER — Encounter: Payer: Self-pay | Admitting: Physical Therapy

## 2015-12-20 ENCOUNTER — Ambulatory Visit (INDEPENDENT_AMBULATORY_CARE_PROVIDER_SITE_OTHER): Payer: 59 | Admitting: Physical Therapy

## 2015-12-20 ENCOUNTER — Other Ambulatory Visit: Payer: Self-pay

## 2015-12-20 DIAGNOSIS — M2560 Stiffness of unspecified joint, not elsewhere classified: Secondary | ICD-10-CM

## 2015-12-20 DIAGNOSIS — R29898 Other symptoms and signs involving the musculoskeletal system: Secondary | ICD-10-CM | POA: Diagnosis not present

## 2015-12-20 DIAGNOSIS — Z7409 Other reduced mobility: Secondary | ICD-10-CM

## 2015-12-20 DIAGNOSIS — R6889 Other general symptoms and signs: Secondary | ICD-10-CM

## 2015-12-20 DIAGNOSIS — E785 Hyperlipidemia, unspecified: Secondary | ICD-10-CM

## 2015-12-20 DIAGNOSIS — R293 Abnormal posture: Secondary | ICD-10-CM | POA: Diagnosis not present

## 2015-12-20 DIAGNOSIS — M623 Immobility syndrome (paraplegic): Secondary | ICD-10-CM | POA: Diagnosis not present

## 2015-12-20 DIAGNOSIS — R531 Weakness: Secondary | ICD-10-CM

## 2015-12-20 MED ORDER — LIRAGLUTIDE 18 MG/3ML ~~LOC~~ SOPN: 1.8000 mg | PEN_INJECTOR | Freq: Every day | SUBCUTANEOUS | Status: DC

## 2015-12-20 NOTE — Patient Instructions (Signed)
Over Head Pull: Narrow Grip     [place] [id number], Once a day    On back, knees bent, feet flat, band across thighs, elbows straight but relaxed. Pull hands apart (start). Keeping elbows straight, bring arms up and over head, hands toward floor. Keep pull steady on band. Hold momentarily. Return slowly, keeping pull steady, back to start. Repeat _2-3x10__ times. Band color __green____   Side Pull: Double Arm   On back, knees bent, feet flat. Arms perpendicular to body, shoulder level, elbows straight but relaxed. Pull arms out to sides, elbows straight. Resistance band comes across collarbones, hands toward floor. Hold momentarily. Slowly return to starting position. Repeat _2-3x10__ times. Band color _green____   Sash   On back, knees bent, feet flat, left hand on left hip, right hand above left. Pull right arm DIAGONALLY (hip to shoulder) across chest. Bring right arm along head toward floor. Hold momentarily. Slowly return to starting position. Repeat 2-3x10___ times. Do with left arm. Band color _green_____   Shoulder Rotation: Double Arm   On back, knees bent, feet flat, elbows tucked at sides, bent 90, hands palms up. Pull hands apart and down toward floor, keeping elbows near sides. Hold momentarily. Slowly return to starting position. Repeat _2-3x10__ times. Band color ___green___

## 2015-12-20 NOTE — Therapy (Signed)
[facility] [place], [place], [address] Phone: [phone]   Fax:  [phone]  Physical Therapy Treatment  Patient Details  Name: Edward Riley MRN: [id number] Date of Birth: 12-24-1965 Referring Provider: Dr Dianah Field  Encounter Date: 12/20/2015      PT End of Session - 12/20/15 0704    Visit Number 2   Number of Visits 12   Date for PT Re-Evaluation 01/25/16   PT Start Time 0702   PT Stop Time 0736   PT Time Calculation (min) 34 min   Activity Tolerance Patient limited by pain      Past Medical History  Diagnosis Date  . Hyperlipidemia   . Diabetes mellitus without complication ([place])   . Obese   . Hypertension   . Retinal hemorrhage, both eyes     History reviewed. No pertinent past surgical history.  There were no vitals filed for this visit.  Visit Diagnosis:  Abnormal posture  Stiffness due to immobility  Weakness of shoulder  Decreased strength, endurance, and mobility      Subjective Assessment - 12/20/15 0703    Subjective Pt says his exercise is going well. Was feeling good Saturday AM and then went out side and felt soreness   Currently in Pain? Yes   Pain Score 2    Pain Location Shoulder   Pain Orientation Left;Right   Pain Descriptors / Indicators Dull   Pain Type Chronic pain   Aggravating Factors  weather, climbing   Pain Relieving Factors rest            [facility] PT Assessment - 12/20/15 0001    Assessment   Medical Diagnosis Impingement syndrome bilat shds   Referring Provider Dr Dianah Field   Onset Date/Surgical Date 05/12/15   Hand Dominance Right   Next MD Visit 01/04/16   Precautions   Precaution Comments light duty - limited lifting no more than 25#; no standing or walking more than 20 min    Strength   Overall Strength Comments --  shoulder ER 5-/5, IR 5/5                     [facility] Adult PT Treatment/Exercise - 12/20/15 0001    Shoulder Exercises: Supine   Other Supine Exercises 2x10, green band overhead, horizontal abd, SASH, ER.   Shoulder Exercises: Prone   Other Prone Exercises 2x10 T's with 2#   Other Prone Exercises POE wt shift side/side, then arm lifts.    Shoulder Exercises: Standing   Flexion Strengthening;Both;15 reps;Weights  with noodle behind back.    Shoulder Flexion Weight (lbs) 2#   Shoulder Exercises: Stretch   Corner Stretch Limitations doorway stretch - low and mid                PT Education - 12/20/15 0706    Education provided Yes   Person(s) Educated Patient   Methods Explanation;Handout;Demonstration   Comprehension Returned demonstration             PT Long Term Goals - 12/20/15 0711    PT LONG TERM GOAL #1   Title Improve posture and alignment with pt to demo good position of scapulae along thoracic wall and head of humerus in improved alignment 01/25/16   Status On-going   PT LONG TERM GOAL #2   Title Increase ROM bilat shds by 10-20 degrees throughout 01/25/16   Status On-going   PT LONG TERM GOAL #3   Title 5/5  strength bilat shd ER/IR 01/25/16   Status Partially Met  IR 5/5, ER 5-/5   PT LONG TERM GOAL #4   Title I in HEP 01/25/16   Status On-going   PT LONG TERM GOAL #5   Title Improve FOTO to </= 27% limitaion 01/25/16   Status On-going               Plan - 12/20/15 0709    Clinical Impression Statement Pt states he had some moments over the weekend when the shoulders weren't to bad.  He is ready for the band exercises and is will ing to try dry needling at his next visit. No goals met.    Pt will benefit from skilled therapeutic intervention in order to improve on the following deficits Postural dysfunction;Improper body mechanics;Increased fascial restricitons;Decreased range of motion;Decreased strength;Decreased activity tolerance;Pain   Rehab Potential Good   PT Frequency 2x / week   PT Duration 6 weeks   PT Treatment/Interventions  Patient/family education;ADLs/Self Care Home Management;Therapeutic exercise;Therapeutic activities;Manual techniques;Dry needling;Cryotherapy;Electrical Stimulation;Moist Heat;Ultrasound;Neuromuscular re-education   PT Next Visit Plan TDN to shoulder complex/pecs   Consulted and Agree with Plan of Care Patient        Problem List Patient Active Problem List   Diagnosis Date Noted  . Impingement syndrome of both shoulders 12/07/2015  . Family hx of prostate cancer 12/06/2015  . Type 2 diabetes mellitus with diabetic nephropathy ([place]) 08/06/2015  . Hyperlipidemia 05/20/2015  . Retinal hemorrhage 05/20/2015  . Microalbuminuric diabetic nephropathy ([place]) 05/20/2015  . Erectile dysfunction 05/07/2015  . Other and unspecified hyperlipidemia 12/08/2013  . Nausea alone 12/08/2013  . Essential hypertension, benign 12/08/2013    Manuela Schwartz Arcenio Mullaly PT 12/20/2015, 7:37 AM  [facility] [place], [place], [address] Phone: [phone]   Fax:  [phone]  Name: General Wearing MRN: [id number] Date of Birth: 03-Nov-1966

## 2015-12-21 ENCOUNTER — Ambulatory Visit: Payer: BLUE CROSS/BLUE SHIELD | Admitting: Family Medicine

## 2015-12-21 ENCOUNTER — Telehealth: Payer: Self-pay | Admitting: Family Medicine

## 2015-12-21 ENCOUNTER — Encounter: Payer: Self-pay | Admitting: Family Medicine

## 2015-12-21 NOTE — Telephone Encounter (Signed)
Pt called and had a question about the victoza and his insurance .Marland Kitchen Thanks

## 2015-12-22 ENCOUNTER — Encounter: Payer: Self-pay | Admitting: Physical Therapy

## 2015-12-22 ENCOUNTER — Ambulatory Visit (INDEPENDENT_AMBULATORY_CARE_PROVIDER_SITE_OTHER): Payer: 59 | Admitting: Physical Therapy

## 2015-12-22 DIAGNOSIS — M623 Immobility syndrome (paraplegic): Secondary | ICD-10-CM | POA: Diagnosis not present

## 2015-12-22 DIAGNOSIS — Z7409 Other reduced mobility: Secondary | ICD-10-CM

## 2015-12-22 DIAGNOSIS — M2560 Stiffness of unspecified joint, not elsewhere classified: Secondary | ICD-10-CM

## 2015-12-22 DIAGNOSIS — R293 Abnormal posture: Secondary | ICD-10-CM | POA: Diagnosis not present

## 2015-12-22 NOTE — Therapy (Signed)
[place], [place], [address] Phone: [phone]   Fax:  [phone]  Physical Therapy Treatment  Patient Details  Name: Edward Riley MRN: [id number] Date of Birth: 1966-11-15 Referring Provider: Dr Dianah Field  Encounter Date: 12/22/2015      PT End of Session - 12/22/15 0700    Visit Number 3   Number of Visits 12   Date for PT Re-Evaluation 01/25/16   PT Start Time 0700   PT Stop Time 0750   PT Time Calculation (min) 50 min   Activity Tolerance Patient tolerated treatment well      Past Medical History  Diagnosis Date  . Hyperlipidemia   . Diabetes mellitus without complication ([place])   . Obese   . Hypertension   . Retinal hemorrhage, both eyes     History reviewed. No pertinent past surgical history.  There were no vitals filed for this visit.  Visit Diagnosis:  Abnormal posture  Stiffness due to immobility      Subjective Assessment - 12/22/15 0700    Subjective Pt didn't get to stretch yesterday, his uncle was in the hospital and he was there.     Patient Stated Goals get rid of the pain    Currently in Pain? No/denies             [facility] PT Assessment - 12/22/15 0001    Assessment   Medical Diagnosis Impingement syndrome bilat shds   AROM   Right Shoulder Internal Rotation 52 Degrees   Right Shoulder External Rotation 54 Degrees   Left Shoulder Internal Rotation 54 Degrees   Left Shoulder External Rotation 60 Degrees                     [facility] Adult PT Treatment/Exercise - 12/22/15 0001    Shoulder Exercises: ROM/Strengthening   UBE (Upper Arm Bike) L2x4' alt FWD/BWD   Shoulder Exercises: Stretch   Corner Stretch Limitations doorway stretch - low and mid   Internal Rotation Stretch 30 seconds   Internal Rotation Stretch Limitations VC for form   Modalities   Modalities Electrical Stimulation;Moist Heat   Moist Heat Therapy   Number Minutes  Moist Heat 15 Minutes   Moist Heat Location Shoulder   Electrical Stimulation   Electrical Stimulation Location 15   Electrical Stimulation Action premod   Electrical Stimulation Parameters to tolerance   Electrical Stimulation Goals Tone   Manual Therapy   Manual Therapy Soft tissue mobilization   Soft tissue mobilization bilat pec major & minor, posterior shoulder musculature          Trigger Point Dry Needling - 12/22/15 0704    Consent Given? Yes   Education Handout Provided Yes   Muscles Treated Upper Body Pectoralis major  teres minor& major   Pectoralis Major Response Twitch response elicited;Palpable increased muscle length                   PT Long Term Goals - 12/20/15 0711    PT LONG TERM GOAL #1   Title Improve posture and alignment with pt to demo good position of scapulae along thoracic wall and head of humerus in improved alignment 01/25/16   Status On-going   PT LONG TERM GOAL #2   Title Increase ROM bilat shds by 10-20 degrees throughout 01/25/16   Status On-going   PT LONG TERM GOAL #3   Title 5/5 strength bilat shd ER/IR 01/25/16   Status Partially Met  IR 5/5, ER 5-/5   PT LONG TERM GOAL #4   Title I in HEP 01/25/16   Status On-going   PT LONG TERM GOAL #5   Title Improve FOTO to </= 27% limitaion 01/25/16   Status On-going               Plan - 12/22/15 0739    Clinical Impression Statement [name] had increased flexibility in his shoulders following treatment today.    Pt will benefit from skilled therapeutic intervention in order to improve on the following deficits Postural dysfunction;Improper body mechanics;Increased fascial restricitons;Decreased range of motion;Decreased strength;Decreased activity tolerance;Pain   Rehab Potential Good   PT Frequency 2x / week   PT Duration 6 weeks   PT Treatment/Interventions Patient/family education;ADLs/Self Care Home Management;Therapeutic exercise;Therapeutic activities;Manual  techniques;Dry needling;Cryotherapy;Electrical Stimulation;Moist Heat;Ultrasound;Neuromuscular re-education   PT Next Visit Plan asses response to TDN        Problem List Patient Active Problem List   Diagnosis Date Noted  . Impingement syndrome of both shoulders 12/07/2015  . Family hx of prostate cancer 12/06/2015  . Type 2 diabetes mellitus with diabetic nephropathy ([place]) 08/06/2015  . Hyperlipidemia 05/20/2015  . Retinal hemorrhage 05/20/2015  . Microalbuminuric diabetic nephropathy ([place]) 05/20/2015  . Erectile dysfunction 05/07/2015  . Other and unspecified hyperlipidemia 12/08/2013  . Nausea alone 12/08/2013  . Essential hypertension, benign 12/08/2013    Edward Riley PT 12/22/2015, 7:41 AM  [facility] [place], [place], [address] Phone: [phone]   Fax:  [phone]  Name: Edward Riley MRN: [id number] Date of Birth: [birth year]

## 2015-12-22 NOTE — Telephone Encounter (Signed)
Triage is working on [place]

## 2015-12-28 ENCOUNTER — Ambulatory Visit (INDEPENDENT_AMBULATORY_CARE_PROVIDER_SITE_OTHER): Payer: 59 | Admitting: Physical Therapy

## 2015-12-28 ENCOUNTER — Encounter: Payer: Self-pay | Admitting: Physical Therapy

## 2015-12-28 DIAGNOSIS — M623 Immobility syndrome (paraplegic): Secondary | ICD-10-CM

## 2015-12-28 DIAGNOSIS — Z7409 Other reduced mobility: Secondary | ICD-10-CM | POA: Diagnosis not present

## 2015-12-28 DIAGNOSIS — M2560 Stiffness of unspecified joint, not elsewhere classified: Secondary | ICD-10-CM

## 2015-12-28 DIAGNOSIS — R531 Weakness: Secondary | ICD-10-CM

## 2015-12-28 DIAGNOSIS — R293 Abnormal posture: Secondary | ICD-10-CM

## 2015-12-28 DIAGNOSIS — R29898 Other symptoms and signs involving the musculoskeletal system: Secondary | ICD-10-CM

## 2015-12-28 DIAGNOSIS — R6889 Other general symptoms and signs: Secondary | ICD-10-CM

## 2015-12-28 NOTE — Therapy (Signed)
[place], [place], [address] Phone: [phone]   Fax:  [phone]  Physical Therapy Treatment  Patient Details  Name: Edward Riley MRN: [id number] Date of Birth: Mar 26, 1966 Referring Provider: Dr Dianah Field  Encounter Date: 12/28/2015      PT End of Session - 12/28/15 0804    Visit Number 4   Number of Visits 12   Date for PT Re-Evaluation 01/25/16   PT Start Time 0804   PT Stop Time 3300   PT Time Calculation (min) 43 min   Activity Tolerance Patient tolerated treatment well      Past Medical History  Diagnosis Date  . Hyperlipidemia   . Diabetes mellitus without complication ([place])   . Obese   . Hypertension   . Retinal hemorrhage, both eyes     History reviewed. No pertinent past surgical history.  There were no vitals filed for this visit.  Visit Diagnosis:  Abnormal posture  Stiffness due to immobility  Weakness of shoulder  Decreased strength, endurance, and mobility      Subjective Assessment - 12/28/15 0804    Subjective Pt states he think the needling helped for a could of days.  He was able to get the left arm to the floor today with the SASH exercise.    Patient Stated Goals get rid of the pain    Pain Score 2    Pain Location Shoulder   Pain Orientation Right;Left   Pain Descriptors / Indicators Dull;Aching   Pain Type Chronic pain   Pain Onset More than a month ago   Pain Frequency Intermittent   Aggravating Factors  using the arms   Pain Relieving Factors rest            [facility] PT Assessment - 12/28/15 0001    Assessment   Medical Diagnosis Impingement syndrome bilat shds   Referring Provider Dr Dianah Field   Onset Date/Surgical Date 05/12/15   Hand Dominance Right   Next MD Visit 01/04/16   Strength   Overall Strength Comments ER/IR 5/5   Palpation   Palpation comment pecs with less tightness after needling                      [facility] Adult PT Treatment/Exercise - 12/28/15 0001    Exercises   Exercises Shoulder   Shoulder Exercises: Supine   Horizontal ABduction Strengthening;Both;10 reps;Theraband  2 sets on whole bolster   Theraband Level (Shoulder Horizontal ABduction) Level 4 (Blue)   Other Supine Exercises 30 reps serratus pushes with 2# wts   Other Supine Exercises pec stretch on bolster  overhead stretch on bolster   Shoulder Exercises: Prone   Other Prone Exercises 3 x10 T's with 2#   Shoulder Exercises: Sidelying   External Rotation Strengthening;Both;10 reps;Weights  3 sets, towel under elbow.    External Rotation Weight (lbs) 2   Other Sidelying Exercises empty can 3x10 bilat with 2#   Shoulder Exercises: ROM/Strengthening   UBE (Upper Arm Bike) L3x4' Alt FWD/BWD   Shoulder Exercises: Stretch   Corner Stretch Limitations doorway stretch - low and mid  2x30 sec each   Manual Therapy   Manual Therapy Soft tissue mobilization;Joint mobilization   Joint Mobilization bilat shoulder ER/IR   Passive ROM hands behind neck stretching                     PT Long Term Goals - 12/28/15 7622  PT LONG TERM GOAL #1   Title Improve posture and alignment with pt to demo good position of scapulae along thoracic wall and head of humerus in improved alignment 01/25/16   Status On-going   PT LONG TERM GOAL #2   Title Increase ROM bilat shds by 10-20 degrees throughout 01/25/16   PT LONG TERM GOAL #3   Title 5/5 strength bilat shd ER/IR 01/25/16   Status Achieved   PT LONG TERM GOAL #4   Title I in HEP 01/25/16   Status On-going   PT LONG TERM GOAL #5   Title Improve FOTO to </= 27% limitaion 01/25/16   Status On-going               Plan - 12/28/15 0915    Clinical Impression Statement Pt is making slow progress, he had a good response to TDN with increased flexibility in his muscles.  He has met another goal and is tolerating more ther ex.    Pt will benefit from skilled  therapeutic intervention in order to improve on the following deficits Postural dysfunction;Improper body mechanics;Increased fascial restricitons;Decreased range of motion;Decreased strength;Decreased activity tolerance;Pain   Rehab Potential Good   PT Frequency 2x / week   PT Duration 6 weeks   PT Treatment/Interventions Patient/family education;ADLs/Self Care Home Management;Therapeutic exercise;Therapeutic activities;Manual techniques;Dry needling;Cryotherapy;Electrical Stimulation;Moist Heat;Ultrasound;Neuromuscular re-education   PT Next Visit Plan progress HEP    Consulted and Agree with Plan of Care Patient        Problem List Patient Active Problem List   Diagnosis Date Noted  . Impingement syndrome of both shoulders 12/07/2015  . Family hx of prostate cancer 12/06/2015  . Type 2 diabetes mellitus with diabetic nephropathy ([place]) 08/06/2015  . Hyperlipidemia 05/20/2015  . Retinal hemorrhage 05/20/2015  . Microalbuminuric diabetic nephropathy ([place]) 05/20/2015  . Erectile dysfunction 05/07/2015  . Other and unspecified hyperlipidemia 12/08/2013  . Nausea alone 12/08/2013  . Essential hypertension, benign 12/08/2013    Jeral Pinch PT 12/28/2015, 9:16 AM  [facility] [place], [place], [address] Phone: [phone]   Fax:  [phone]  Name: Aser Nylund MRN: [id number] Date of Birth: 09-29-1966

## 2015-12-31 ENCOUNTER — Ambulatory Visit (INDEPENDENT_AMBULATORY_CARE_PROVIDER_SITE_OTHER): Payer: 59 | Admitting: Physical Therapy

## 2015-12-31 DIAGNOSIS — R29898 Other symptoms and signs involving the musculoskeletal system: Secondary | ICD-10-CM

## 2015-12-31 DIAGNOSIS — R6889 Other general symptoms and signs: Secondary | ICD-10-CM

## 2015-12-31 DIAGNOSIS — R531 Weakness: Secondary | ICD-10-CM

## 2015-12-31 DIAGNOSIS — R293 Abnormal posture: Secondary | ICD-10-CM | POA: Diagnosis not present

## 2015-12-31 DIAGNOSIS — M623 Immobility syndrome (paraplegic): Secondary | ICD-10-CM

## 2015-12-31 DIAGNOSIS — Z7409 Other reduced mobility: Secondary | ICD-10-CM

## 2015-12-31 DIAGNOSIS — M2560 Stiffness of unspecified joint, not elsewhere classified: Secondary | ICD-10-CM

## 2015-12-31 NOTE — Therapy (Signed)
[place], [place], [address] Phone: [phone]   Fax:  [phone]  Physical Therapy Treatment  Patient Details  Name: Edward Riley MRN: [id number] Date of Birth: [birth year] Referring Provider: Dr Dianah Field  Encounter Date: 12/31/2015      PT End of Session - 12/31/15 0805    Visit Number 5   Number of Visits 12   Date for PT Re-Evaluation 01/25/16   PT Start Time 0805   PT Stop Time 0843   PT Time Calculation (min) 38 min      Past Medical History  Diagnosis Date  . Hyperlipidemia   . Diabetes mellitus without complication ([place])   . Obese   . Hypertension   . Retinal hemorrhage, both eyes     No past surgical history on file.  There were no vitals filed for this visit.  Visit Diagnosis:  Abnormal posture  Stiffness due to immobility  Weakness of shoulder  Decreased strength, endurance, and mobility      Subjective Assessment - 12/31/15 0806    Subjective was some soreness after being stretched out, then it felt better.     Currently in Pain? Yes   Pain Score 1    Pain Location Shoulder   Pain Orientation Right   Pain Descriptors / Indicators Dull   Pain Type Chronic pain                         [facility] Adult PT Treatment/Exercise - 12/31/15 0001    Exercises   Exercises Shoulder   Shoulder Exercises: Supine   Other Supine Exercises 30 reps chest press, 6# each side   Other Supine Exercises 30 reps lat pulls in supine with 4# wt bilat hands   Shoulder Exercises: Sidelying   External Rotation Strengthening;Both;10 reps;Weights   External Rotation Weight (lbs) 2  towel under arms   Shoulder Exercises: Standing   Other Standing Exercises with noodle behind back, full can 1#, 10 reps single, bilat and alternate   Other Standing Exercises counter top push ups   Shoulder Exercises: ROM/Strengthening   UBE (Upper Arm Bike) L3x4' Alt FWD/BWD   Shoulder Exercises: Stretch   Corner Stretch Limitations doorway stretch - low and mid   Internal Rotation Stretch 1 rep  90sec behind back with strap   Other Shoulder Stretches flexion in supine with strap around wrists   Other Shoulder Stretches supine hands behind the head stretch                     PT Long Term Goals - 12/28/15 7253    PT LONG TERM GOAL #1   Title Improve posture and alignment with pt to demo good position of scapulae along thoracic wall and head of humerus in improved alignment 01/25/16   Status On-going   PT LONG TERM GOAL #2   Title Increase ROM bilat shds by 10-20 degrees throughout 01/25/16   PT LONG TERM GOAL #3   Title 5/5 strength bilat shd ER/IR 01/25/16   Status Achieved   PT LONG TERM GOAL #4   Title I in HEP 01/25/16   Status On-going   PT LONG TERM GOAL #5   Title Improve FOTO to </= 27% limitaion 01/25/16   Status On-going               Plan - 12/31/15 0824    Clinical Impression Statement Pt continues to make slow  progress, he follows up with the MD next week.  No goals met.    Rehab Potential Good   PT Frequency 2x / week   PT Duration 6 weeks   PT Treatment/Interventions Patient/family education;ADLs/Self Care Home Management;Therapeutic exercise;Therapeutic activities;Manual techniques;Dry needling;Cryotherapy;Electrical Stimulation;Moist Heat;Ultrasound;Neuromuscular re-education   PT Next Visit Plan write MD note        Problem List Patient Active Problem List   Diagnosis Date Noted  . Impingement syndrome of both shoulders 12/07/2015  . Family hx of prostate cancer 12/06/2015  . Type 2 diabetes mellitus with diabetic nephropathy ([place]) 08/06/2015  . Hyperlipidemia 05/20/2015  . Retinal hemorrhage 05/20/2015  . Microalbuminuric diabetic nephropathy ([place]) 05/20/2015  . Erectile dysfunction 05/07/2015  . Other and unspecified hyperlipidemia 12/08/2013  . Nausea alone 12/08/2013  . Essential hypertension, benign  12/08/2013    Jeral Pinch PT 12/31/2015, 8:44 AM  [facility] [place], [place], [address] Phone: [phone]   Fax:  [phone]  Name: Edward Riley MRN: [id number] Date of Birth: 07-04-1966

## 2015-12-31 NOTE — Telephone Encounter (Addendum)
Received authorization for Victoza. File ID: [id number]. Valid: 12/21/15- 12/20/16. Pharmacy notified.

## 2016-01-03 ENCOUNTER — Encounter: Payer: Self-pay | Admitting: Sports Medicine

## 2016-01-03 ENCOUNTER — Ambulatory Visit (INDEPENDENT_AMBULATORY_CARE_PROVIDER_SITE_OTHER): Payer: 59 | Admitting: Sports Medicine

## 2016-01-03 ENCOUNTER — Encounter: Payer: Self-pay | Admitting: Physical Therapy

## 2016-01-03 ENCOUNTER — Ambulatory Visit (INDEPENDENT_AMBULATORY_CARE_PROVIDER_SITE_OTHER): Payer: 59 | Admitting: Physical Therapy

## 2016-01-03 VITALS — BP 126/73 | HR 95 | Resp 16 | Wt 250.0 lb

## 2016-01-03 DIAGNOSIS — M7541 Impingement syndrome of right shoulder: Secondary | ICD-10-CM | POA: Diagnosis not present

## 2016-01-03 DIAGNOSIS — R293 Abnormal posture: Secondary | ICD-10-CM | POA: Diagnosis not present

## 2016-01-03 DIAGNOSIS — Z7409 Other reduced mobility: Secondary | ICD-10-CM | POA: Diagnosis not present

## 2016-01-03 DIAGNOSIS — M623 Immobility syndrome (paraplegic): Secondary | ICD-10-CM | POA: Diagnosis not present

## 2016-01-03 DIAGNOSIS — R6889 Other general symptoms and signs: Secondary | ICD-10-CM

## 2016-01-03 DIAGNOSIS — R29898 Other symptoms and signs involving the musculoskeletal system: Secondary | ICD-10-CM

## 2016-01-03 DIAGNOSIS — M7542 Impingement syndrome of left shoulder: Secondary | ICD-10-CM

## 2016-01-03 DIAGNOSIS — M2560 Stiffness of unspecified joint, not elsewhere classified: Secondary | ICD-10-CM

## 2016-01-03 DIAGNOSIS — R531 Weakness: Secondary | ICD-10-CM

## 2016-01-03 NOTE — Assessment & Plan Note (Signed)
Currently pain-free after bilateral subacromial injection at the last visit, return as needed. Released to full duty.

## 2016-01-03 NOTE — Progress Notes (Signed)
  Subjective:    CC: follow-up  HPI: This is a pleasant 50 year old male, we injected left and right subacromial bursae at the last visit, he has done some physical therapy and returns today essentially pain-free. Happy with how things are going. Desires custom orthotics.  Past medical history, Surgical history, Family history not pertinant except as noted below, Social history, Allergies, and medications have been entered into the medical record, reviewed, and no changes needed.   Review of Systems: No fevers, chills, night sweats, weight loss, chest pain, or shortness of breath.   Objective:    General: Well Developed, well nourished, and in no acute distress.  Neuro: Alert and oriented x3, extra-ocular muscles intact, sensation grossly intact.  HEENT: Normocephalic, atraumatic, pupils equal round reactive to light, neck supple, no masses, no lymphadenopathy, thyroid nonpalpable.  Skin: Warm and dry, no rashes. Cardiac: Regular rate and rhythm, no murmurs rubs or gallops, no lower extremity edema.  Respiratory: Clear to auscultation bilaterally. Not using accessory muscles, speaking in full sentences.  Impression and Recommendations:

## 2016-01-03 NOTE — Therapy (Addendum)
[place], [place], [address] Phone: [phone]   Fax:  [phone]  Physical Therapy Treatment  Patient Details  Name: Edward Riley MRN: [id number] Date of Birth: [birth year] Referring Provider: Dr Dianah Field  Encounter Date: 01/03/2016      PT End of Session - 01/03/16 0737    Visit Number 6   Number of Visits 12   Date for PT Re-Evaluation 01/25/16   PT Start Time 0736   PT Stop Time 0801   PT Time Calculation (min) 25 min   Activity Tolerance Patient tolerated treatment well      Past Medical History  Diagnosis Date  . Hyperlipidemia   . Diabetes mellitus without complication ([place])   . Obese   . Hypertension   . Retinal hemorrhage, both eyes     History reviewed. No pertinent past surgical history.  There were no vitals filed for this visit.  Visit Diagnosis:  Abnormal posture  Stiffness due to immobility  Weakness of shoulder  Decreased strength, endurance, and mobility      Subjective Assessment - 01/03/16 0734    Subjective Sees the MD tomorrow, wants a good note so he can go back to full duty at work and not have to come to therapy   Currently in Pain? No/denies            [facility] PT Assessment - 01/03/16 0001    Assessment   Medical Diagnosis Impingement syndrome bilat shds   Referring Provider Dr Dianah Field   Onset Date/Surgical Date 05/12/15   Hand Dominance Right   Next MD Visit 01/04/16   Observation/Other Assessments   Focus on Therapeutic Outcomes (FOTO)  21% limited   ROM / Strength   AROM / PROM / Strength AROM;Strength   AROM   Right Shoulder Extension 65 Degrees   Right Shoulder Flexion 165 Degrees   Right Shoulder ABduction 120 Degrees   Right Shoulder Internal Rotation 85 Degrees   Right Shoulder External Rotation 76 Degrees   Left Shoulder Extension 63 Degrees   Left Shoulder Flexion 164 Degrees   Left Shoulder ABduction 152 Degrees   Left Shoulder Internal Rotation 88 Degrees   Left Shoulder External Rotation 67 Degrees   Cervical Flexion WNL   Cervical Extension WNL    Cervical - Right Rotation 62   Cervical - Left Rotation 70   Strength   Overall Strength Comments --  bilat UE's WNL                     [facility] Adult PT Treatment/Exercise - 01/03/16 0001    Exercises   Exercises Shoulder   Shoulder Exercises: Supine   Horizontal ABduction Strengthening;Both;20 reps;Theraband   Theraband Level (Shoulder Horizontal ABduction) Level 4 (Blue)   External Rotation Strengthening;Both;20 reps;Theraband   Theraband Level (Shoulder External Rotation) Level 4 (Blue)   Other Supine Exercises 20 reps SASH blue band   Shoulder Exercises: ROM/Strengthening   UBE (Upper Arm Bike) L4x4' alt FWD/BWD   Shoulder Exercises: Stretch   Other Shoulder Stretches bilat shoulder flex stretch.                      PT Long Term Goals - 01/03/16 9774    PT LONG TERM GOAL #1   Title Improve posture and alignment with pt to demo good position of scapulae along thoracic wall and head of humerus in improved alignment 01/25/16   Status Achieved  PT LONG TERM GOAL #2   Title Increase ROM bilat shds by 10-20 degrees throughout 01/25/16   Status Partially Met  met over 90% of the motions   PT LONG TERM GOAL #3   Title 5/5 strength bilat shd ER/IR 01/25/16   Status Achieved   PT LONG TERM GOAL #4   Title I in HEP 01/25/16   Status Achieved   PT LONG TERM GOAL #5   Status Achieved  pt scored 21% limited               Problem List Patient Active Problem List   Diagnosis Date Noted  . Impingement syndrome of both shoulders 12/07/2015  . Family hx of prostate cancer 12/06/2015  . Type 2 diabetes mellitus with diabetic nephropathy ([place]) 08/06/2015  . Hyperlipidemia 05/20/2015  . Retinal hemorrhage 05/20/2015  . Microalbuminuric diabetic nephropathy ([place]) 05/20/2015  . Erectile dysfunction 05/07/2015  .  Other and unspecified hyperlipidemia 12/08/2013  . Nausea alone 12/08/2013  . Essential hypertension, benign 12/08/2013    Jeral Pinch PT 01/03/2016, 8:00 AM  [facility] [place], [place], [address] Phone: [phone]   Fax:  [phone]  Name: Rodricus Candelaria MRN: [id number] Date of Birth: [birth year]  PHYSICAL THERAPY DISCHARGE SUMMARY  Visits from Start of Care: 6  Current functional level related to goals / functional outcomes: See above   Remaining deficits: none   Education / Equipment:HEP  Plan: Patient agrees to discharge.  Patient goals were met. Patient is being discharged due to meeting the stated rehab goals.  ?????   Jeral Pinch, PT 01/03/2016 8:02 AM

## 2016-01-04 ENCOUNTER — Ambulatory Visit: Payer: 59 | Admitting: Sports Medicine

## 2016-01-06 ENCOUNTER — Ambulatory Visit (INDEPENDENT_AMBULATORY_CARE_PROVIDER_SITE_OTHER): Payer: 59 | Admitting: Sports Medicine

## 2016-01-06 ENCOUNTER — Encounter: Payer: Self-pay | Admitting: Sports Medicine

## 2016-01-06 VITALS — BP 123/73 | HR 104 | Ht 69.0 in | Wt 246.0 lb

## 2016-01-06 DIAGNOSIS — M79671 Pain in right foot: Secondary | ICD-10-CM | POA: Diagnosis not present

## 2016-01-06 DIAGNOSIS — M79672 Pain in left foot: Secondary | ICD-10-CM

## 2016-01-06 NOTE — Assessment & Plan Note (Signed)
Custom orthotics as above, return as needed. 

## 2016-01-06 NOTE — Progress Notes (Signed)

## 2016-01-10 ENCOUNTER — Encounter: Payer: 59 | Admitting: Sports Medicine

## 2016-03-01 ENCOUNTER — Other Ambulatory Visit: Payer: Self-pay

## 2016-03-01 DIAGNOSIS — I10 Essential (primary) hypertension: Secondary | ICD-10-CM

## 2016-03-01 MED ORDER — LISINOPRIL 20 MG PO TABS: 20.0000 mg | ORAL_TABLET | Freq: Every morning | ORAL | Status: DC

## 2016-03-06 ENCOUNTER — Encounter: Payer: BLUE CROSS/BLUE SHIELD | Admitting: Family Medicine

## 2016-05-26 ENCOUNTER — Ambulatory Visit (INDEPENDENT_AMBULATORY_CARE_PROVIDER_SITE_OTHER): Payer: 59 | Admitting: Family Medicine

## 2016-05-26 ENCOUNTER — Other Ambulatory Visit: Payer: Self-pay | Admitting: Family Medicine

## 2016-05-26 ENCOUNTER — Encounter: Payer: Self-pay | Admitting: Family Medicine

## 2016-05-26 VITALS — BP 161/96 | HR 87 | Temp 98.3°F | Wt 241.0 lb

## 2016-05-26 DIAGNOSIS — A499 Bacterial infection, unspecified: Secondary | ICD-10-CM

## 2016-05-26 DIAGNOSIS — E785 Hyperlipidemia, unspecified: Secondary | ICD-10-CM

## 2016-05-26 DIAGNOSIS — B9689 Other specified bacterial agents as the cause of diseases classified elsewhere: Secondary | ICD-10-CM

## 2016-05-26 DIAGNOSIS — E119 Type 2 diabetes mellitus without complications: Secondary | ICD-10-CM

## 2016-05-26 DIAGNOSIS — I10 Essential (primary) hypertension: Secondary | ICD-10-CM

## 2016-05-26 DIAGNOSIS — J329 Chronic sinusitis, unspecified: Secondary | ICD-10-CM

## 2016-05-26 MED ORDER — CEFDINIR 300 MG PO CAPS: 300.0000 mg | ORAL_CAPSULE | Freq: Two times a day (BID) | ORAL | Status: AC

## 2016-05-26 MED ORDER — ATORVASTATIN CALCIUM 80 MG PO TABS: 80.0000 mg | ORAL_TABLET | Freq: Every day | ORAL | Status: DC

## 2016-05-26 MED ORDER — PIOGLITAZONE HCL 45 MG PO TABS: 45.0000 mg | ORAL_TABLET | Freq: Every day | ORAL | Status: DC

## 2016-05-26 MED ORDER — CANAGLIFLOZIN-METFORMIN HCL 150-1000 MG PO TABS: 1.0000 | ORAL_TABLET | Freq: Two times a day (BID) | ORAL | Status: DC

## 2016-05-26 MED ORDER — LISINOPRIL 20 MG PO TABS: 20.0000 mg | ORAL_TABLET | Freq: Every morning | ORAL | Status: DC

## 2016-05-26 MED ORDER — CEFDINIR 300 MG PO CAPS: 300.0000 mg | ORAL_CAPSULE | Freq: Two times a day (BID) | ORAL | Status: DC

## 2016-05-26 MED ORDER — METHYLPREDNISOLONE ACETATE 80 MG/ML IJ SUSP
80.0000 mg | Freq: Once | INTRAMUSCULAR | Status: AC
  Administered 2016-05-26: 80 mg via INTRAMUSCULAR

## 2016-05-26 NOTE — Progress Notes (Signed)
CC: Edward Riley is a 50 y.o. male is here for Nasal Congestion and Chills   Subjective: HPI:  Follow-up essential hypertension: He tells me he's been out of his blood pressure medication for months now. He recently got insurance and would like to restart on lisinopril. No outside blood pressures reported. Denies chest pain shortness of breath orthopnea nor peripheral edema  Follow-up hyperlipidemia: He like a refill on atorvastatin. He's been out of this medication for 3 months now. He denies any right upper quadrant pain or myalgias when he is taking it. He like to restart it now that he has insurance again  Follow-up type 2 diabetes: He's been out of all of his blood sugar medication for 3 months now. He tried to restart these medications now has insurance. He's had some polydipsia but no poorly healing wounds, nor vision disturbance  He's also been suffering from nasal congestion, facial pressure and postnasal drip with occasional cough about a week now. No benefit from DayQuil or Mucinex. He denies fevers, chills, wheezing, shortness of breath or blood in sputum.   Review Of Systems Outlined In HPI  Past Medical History  Diagnosis Date  . Hyperlipidemia   . Diabetes mellitus without complication (HCC)   . Obese   . Hypertension   . Retinal hemorrhage, both eyes     No past surgical history on file. Family History  Problem Relation Age of Onset  . Arthritis Mother   . Hyperlipidemia Mother   . Hypertension Mother   . Cancer Father 2860    Prostate (Metastatic) Lung, Esophagus, Stomach  . Alcohol abuse Father   . Kidney disease Brother   . Hypertension Brother   . Cancer Paternal Aunt     Breast Cancer  . Alzheimer's disease Maternal Grandmother   . Heart disease Paternal Grandmother   . Diabetes Brother   . Alcohol abuse Maternal Uncle     Social History   Social History  . Marital Status: Single    Spouse Name: N/A  . Number of Children: N/A  . Years of  Education: 12   Occupational History  . TRUCK DRIVER      [employer]   Social History Main Topics  . Smoking status: Never Smoker   . Smokeless tobacco: Never Used  . Alcohol Use: Yes     Comment: Rarely  . Drug Use: No  . Sexual Activity: Yes   Other Topics Concern  . 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 Conjunctivae clear.  External ears unremarkable, canals clear with intact TMs with appropriate landmarks.  Middle ear appears open without effusion. Pink inferior turbinates.  Moist mucous membranes, pharynx without inflammation nor lesions.  Neck supple without palpable lymphadenopathy nor abnormal  masses. Lungs: Clear to auscultation bilaterally, no wheezing/ronchi/rales.  Comfortable work of breathing. Good air movement. Cardiac: Regular rate and rhythm. Normal S1/S2.  No murmurs, rubs, nor gallops.   Extremities: No peripheral edema.  Strong peripheral pulses.  Mental Status: No depression, anxiety, nor agitation. Skin: Warm and dry.  Assessment & Plan: Edward Riley was seen today for nasal congestion and chills.  Diagnoses and all orders for this visit:  Essential hypertension, benign Comments: Adding some lisinopril to lower his BP.   Orders: -     Discontinue: lisinopril (PRINIVIL,ZESTRIL) 20 MG tablet; Take 1 tablet (20 mg total) by mouth every morning. -     lisinopril (PRINIVIL,ZESTRIL) 20 MG tablet; Take 1 tablet (20 mg total)  by mouth every morning.  Hyperlipidemia -     Discontinue: Canagliflozin-Metformin HCl [phone] MG TABS; Take 1 tablet by mouth 2 (two) times daily. -     Canagliflozin-Metformin HCl [phone] MG TABS; Take 1 tablet by mouth 2 (two) times daily.  Bacterial sinusitis -     Discontinue: cefdinir (OMNICEF) 300 MG capsule; Take 1 capsule (300 mg total) by mouth 2 (two) times daily. -     cefdinir (OMNICEF) 300 MG capsule; Take 1 capsule (300 mg total) by mouth 2 (two) times daily. -     methylPREDNISolone acetate (DEPO-MEDROL) injection 80 mg; Inject 1 mL (80 mg total) into the muscle once.  Type 2 diabetes mellitus without complication, without long-term current use of insulin (HCC) -     pioglitazone (ACTOS) 45 MG tablet; Take 1 tablet (45 mg total) by mouth daily.   Essential hypertension: Uncontrolled therefore restart formula regimen of lisinopril Type 2 diabetes: I don't see any point of getting an A1c today since it's not change management of his diabetes I like him to restart all of his antihyperglycemic medication described above and return in 3 months for an A1c Hyperlipidemia: No point getting an LDL today, restart atorvastatin and will recheck LDL in 3 months Bacterial sinusitis: Start Omnicef and start nasal saline washes  Return in about 3 months (around 08/26/2016) for Sugar and BP.

## 2016-06-02 ENCOUNTER — Ambulatory Visit (INDEPENDENT_AMBULATORY_CARE_PROVIDER_SITE_OTHER): Payer: BLUE CROSS/BLUE SHIELD | Admitting: Family Medicine

## 2016-06-02 ENCOUNTER — Encounter: Payer: Self-pay | Admitting: Family Medicine

## 2016-06-02 VITALS — BP 115/69 | HR 90 | Wt 237.0 lb

## 2016-06-02 DIAGNOSIS — R5383 Other fatigue: Secondary | ICD-10-CM

## 2016-06-02 DIAGNOSIS — R61 Generalized hyperhidrosis: Secondary | ICD-10-CM

## 2016-06-02 DIAGNOSIS — E1165 Type 2 diabetes mellitus with hyperglycemia: Secondary | ICD-10-CM

## 2016-06-02 DIAGNOSIS — IMO0002 Reserved for concepts with insufficient information to code with codable children: Secondary | ICD-10-CM

## 2016-06-02 DIAGNOSIS — E1121 Type 2 diabetes mellitus with diabetic nephropathy: Secondary | ICD-10-CM | POA: Diagnosis not present

## 2016-06-02 DIAGNOSIS — E118 Type 2 diabetes mellitus with unspecified complications: Secondary | ICD-10-CM

## 2016-06-02 LAB — GLUCOSE, POCT (MANUAL RESULT ENTRY): POC Glucose: 203 mg/dl — AB (ref 70–99)

## 2016-06-02 LAB — POCT GLYCOSYLATED HEMOGLOBIN (HGB A1C): Hemoglobin A1C: 14

## 2016-06-02 NOTE — Patient Instructions (Signed)
Will start 10 units of Toujeo at betime.

## 2016-06-02 NOTE — Progress Notes (Addendum)
Subjective:    CC: Fatigue  HPI:  He was seen on 05/26/16 he was seen for a bacterial sinusitis.  Started on Omnicef 300mg .  He is feeling some better but still fatigued.  He is diabetic.  He is sweating a lot. Says he wants to sleep a lot.  Diabetes is uncontrolled.  He had evidently not come in regularly because he had lost insurance but now has insurance again. Still has a couple days left on the antibiotic. No nausea or vomiting. He says he had noticed some increased frequency of urination but that has actually gotten better in the last week since restarting his old medications.He denies any fevers or chills. He does report sweating profusely. He does work out in the heat sometimes be says even for him this is unusual. He says he was sweats and much the emesis feels like he cannot pass out. He just feels weak every day. He feels like it's an effort to work. He denies any nausea or vomiting or GI upset or irritation. He gets some occasional muscle cramping but not frequently. Lab Results  Component Value Date   HGBA1C 9.6 12/06/2015    Past medical history, Surgical history, Family history not pertinant except as noted below, Social history, Allergies, and medications have been entered into the medical record, reviewed, and corrections made.   Review of Systems: No fevers, chills, night sweats, weight loss, chest pain, or shortness of breath.   Objective:    General: Well Developed, well nourished, and in no acute distress.  Neuro: Alert and oriented x3, extra-ocular muscles intact, sensation grossly intact.  HEENT: Normocephalic, atraumatic, Oropharynx is clear. TMs and canals are clear bilaterally. No cervical lymphadenopathy. No TM.  Skin: Warm and dry, no rashes. Cardiac: Regular rate and rhythm, no murmurs rubs or gallops, no lower extremity edema.  Respiratory: Clear to auscultation bilaterally. Not using accessory muscles, speaking in full sentences. Ext: No LE edema.     Impression and Recommendations:   Uncontrolled diabetes - Hemoglobin A1c greater than 14 today. We discussed how this can contribute to the symptoms he is currently experiencing. He admits his diet has not been the best and he says he plans on eating better. He is back on all of his prescription medications and has been on them consistently for about a week. Continue current medications. Will add 10 units of Toujeo. Demonstrated on how to use the pen and patient inject his first 10 units himself here in the office today. Keep follow-up appointment with her care provider. If he is able to get his blood sugars down when he follows up in about 6 weeks then he may be able to come off of the insulin. Hopefully the Victoza will be working more effectively by then. Increase water intake. Check UA for infection.    Fatigue - Most likely from uncontrolled diabetes. Do some additional lab work to rule out thyroid disorder anemia etc.  Sinusitis-make sure to complete antibiotics. He should have 3 more days left.

## 2016-06-03 LAB — LIPID PANEL
CHOLESTEROL: 232 mg/dL — AB (ref 125–200)
HDL: 36 mg/dL — ABNORMAL LOW (ref >=40)
Total CHOL/HDL Ratio: 6.4 Ratio — ABNORMAL HIGH (ref <=5.0)
Triglycerides: 433 mg/dL — ABNORMAL HIGH (ref <150)

## 2016-06-03 LAB — CBC WITH DIFFERENTIAL/PLATELET
BASOS PCT: 0 %
Basophils Absolute: 0 cells/uL (ref 0–200)
EOS PCT: 1 %
Eosinophils Absolute: 102 cells/uL (ref 15–500)
HEMATOCRIT: 45.8 % (ref 38.5–50.0)
Hemoglobin: 15.3 g/dL (ref 13.2–17.1)
LYMPHS PCT: 17 %
Lymphs Abs: 1734 cells/uL (ref 850–3900)
MCH: 30.1 pg (ref 27.0–33.0)
MCHC: 33.4 g/dL (ref 32.0–36.0)
MCV: 90.2 fL (ref 80.0–100.0)
MONO ABS: 612 {cells}/uL (ref 200–950)
MONOS PCT: 6 %
MPV: 11.5 fL (ref 7.5–12.5)
Neutro Abs: 7752 cells/uL (ref 1500–7800)
Neutrophils Relative %: 76 %
PLATELETS: 351 10*3/uL (ref 140–400)
RBC: 5.08 MIL/uL (ref 4.20–5.80)
RDW: 12.8 % (ref 11.0–15.0)
WBC: 10.2 10*3/uL (ref 3.8–10.8)

## 2016-06-03 LAB — COMPLETE METABOLIC PANEL WITH GFR
ALBUMIN: 4.3 g/dL (ref 3.6–5.1)
ALT: 18 U/L (ref 9–46)
AST: 16 U/L (ref 10–40)
Alkaline Phosphatase: 73 U/L (ref 40–115)
BUN: 16 mg/dL (ref 7–25)
CALCIUM: 9.9 mg/dL (ref 8.6–10.3)
CHLORIDE: 97 mmol/L — AB (ref 98–110)
CO2: 23 mmol/L (ref 20–31)
CREATININE: 1.09 mg/dL (ref 0.60–1.35)
GFR, Est African American: >89 mL/min (ref >=60)
GFR, Est Non African American: 80 mL/min (ref >=60)
Glucose, Bld: 199 mg/dL — ABNORMAL HIGH (ref 65–99)
Potassium: 4.8 mmol/L (ref 3.5–5.3)
Sodium: 136 mmol/L (ref 135–146)
Total Bilirubin: 0.7 mg/dL (ref 0.2–1.2)
Total Protein: 6.7 g/dL (ref 6.1–8.1)

## 2016-06-03 LAB — TSH: TSH: 0.54 m[IU]/L (ref 0.40–4.50)

## 2016-06-03 LAB — URINALYSIS, MICROSCOPIC ONLY
Bacteria, UA: NONE SEEN [HPF]
CASTS: NONE SEEN [LPF]
CRYSTALS: NONE SEEN [HPF]
RBC / HPF: NONE SEEN RBC/HPF (ref <=2)
Squamous Epithelial / LPF: NONE SEEN [HPF] (ref <=5)
WBC, UA: NONE SEEN WBC/HPF (ref <=5)
Yeast: NONE SEEN [HPF]

## 2016-06-03 LAB — VITAMIN B12: VITAMIN B 12: 510 pg/mL (ref 200–1100)

## 2016-06-03 LAB — URINALYSIS, ROUTINE W REFLEX MICROSCOPIC
Bilirubin Urine: NEGATIVE
Hgb urine dipstick: NEGATIVE
LEUKOCYTES UA: NEGATIVE
NITRITE: NEGATIVE
PH: 5 (ref 5.0–8.0)
SPECIFIC GRAVITY, URINE: 1.044 — AB (ref 1.001–1.035)

## 2016-06-03 LAB — FOLATE: FOLATE: 17 ng/mL (ref >5.4)

## 2016-06-03 LAB — FERRITIN: Ferritin: 941 ng/mL — ABNORMAL HIGH (ref 20–380)

## 2016-06-03 LAB — VITAMIN D 25 HYDROXY (VIT D DEFICIENCY, FRACTURES): Vit D, 25-Hydroxy: 25 ng/mL — ABNORMAL LOW (ref 30–100)

## 2016-06-03 LAB — MICROALBUMIN / CREATININE URINE RATIO
Creatinine, Urine: 78 mg/dL (ref 20–370)
MICROALB/CREAT RATIO: 299 ug/mg{creat} — AB (ref <30)
Microalb, Ur: 23.3 mg/dL

## 2016-06-04 ENCOUNTER — Other Ambulatory Visit: Payer: Self-pay | Admitting: Family Medicine

## 2016-06-05 ENCOUNTER — Telehealth: Payer: Self-pay | Admitting: Family Medicine

## 2016-06-05 ENCOUNTER — Other Ambulatory Visit: Payer: Self-pay | Admitting: *Deleted

## 2016-06-05 MED ORDER — AMBULATORY NON FORMULARY MEDICATION: Status: AC

## 2016-06-05 MED ORDER — BAYER CONTOUR MONITOR W/DEVICE KIT: PACK | Status: DC

## 2016-06-05 MED ORDER — GLUCOSE BLOOD VI STRP: ORAL_STRIP | Status: DC

## 2016-06-05 NOTE — Telephone Encounter (Signed)
BS monitor change

## 2016-06-09 DIAGNOSIS — H524 Presbyopia: Secondary | ICD-10-CM | POA: Diagnosis not present

## 2016-06-24 ENCOUNTER — Other Ambulatory Visit: Payer: Self-pay | Admitting: Family Medicine

## 2016-06-24 DIAGNOSIS — E785 Hyperlipidemia, unspecified: Secondary | ICD-10-CM

## 2016-06-27 NOTE — Telephone Encounter (Signed)
Rx sent to [facility] on [address]

## 2016-07-14 ENCOUNTER — Ambulatory Visit (INDEPENDENT_AMBULATORY_CARE_PROVIDER_SITE_OTHER): Payer: BLUE CROSS/BLUE SHIELD | Admitting: Family Medicine

## 2016-07-14 ENCOUNTER — Encounter: Payer: Self-pay | Admitting: Family Medicine

## 2016-07-14 VITALS — BP 120/79 | HR 109 | Wt 230.0 lb

## 2016-07-14 DIAGNOSIS — E785 Hyperlipidemia, unspecified: Secondary | ICD-10-CM

## 2016-07-14 DIAGNOSIS — I10 Essential (primary) hypertension: Secondary | ICD-10-CM | POA: Diagnosis not present

## 2016-07-14 DIAGNOSIS — E119 Type 2 diabetes mellitus without complications: Secondary | ICD-10-CM | POA: Diagnosis not present

## 2016-07-14 DIAGNOSIS — E1121 Type 2 diabetes mellitus with diabetic nephropathy: Secondary | ICD-10-CM

## 2016-07-14 LAB — POCT GLYCOSYLATED HEMOGLOBIN (HGB A1C): Hemoglobin A1C: 9

## 2016-07-14 MED ORDER — LISINOPRIL 20 MG PO TABS: 20.0000 mg | ORAL_TABLET | Freq: Every morning | ORAL | 1 refills | Status: DC

## 2016-07-14 MED ORDER — ATORVASTATIN CALCIUM 80 MG PO TABS: 80.0000 mg | ORAL_TABLET | Freq: Every day | ORAL | 1 refills | Status: DC

## 2016-07-14 MED ORDER — PIOGLITAZONE HCL 45 MG PO TABS: 45.0000 mg | ORAL_TABLET | Freq: Every day | ORAL | 1 refills | Status: DC

## 2016-07-14 MED ORDER — CANAGLIFLOZIN-METFORMIN HCL 150-1000 MG PO TABS: 1.0000 | ORAL_TABLET | Freq: Two times a day (BID) | ORAL | 1 refills | Status: DC

## 2016-07-14 MED ORDER — LIRAGLUTIDE 18 MG/3ML ~~LOC~~ SOPN
PEN_INJECTOR | SUBCUTANEOUS | 1 refills | Status: DC
Start: 2016-07-14 — End: 2016-10-11

## 2016-07-14 NOTE — Progress Notes (Signed)
CC: Edward OuGregory Riley is a 50 y.o. male is here for Hyperglycemia   Subjective: HPI:   Follow-up type 2 diabetes: Taking invokana, Victoza and Actos with 80% compliance. He denies any known intolerance. It's occasionally missed due to forgetfulness. He is also taking 10 units of toujeo can remember. He denies any hypoglycemic episodes or any outside blood sugars to report. He denies any poorly healing wounds or vision disturbance  Follow-up hyperlipidemia: Since I saw him last he's restarted atorvastatin without any right upper quadrant pain or myalgias.  Follow-up essential hypertension: Continues to take lisinopril with no outside blood pressures to report. Denies chest pain shortness of breath orthopnea or peripheral edema.  He recently had a ferritin level checked that was quite high. He doesn't know the term hemachromatosis nor has he had any family members with iron storage disease.  Review Of Systems Outlined In HPI  Past Medical History:  Diagnosis Date  . Diabetes mellitus without complication (HCC)   . Hyperlipidemia   . Hypertension   . Obese   . Retinal hemorrhage, both eyes     No past surgical history on file. Family History  Problem Relation Age of Onset  . Arthritis Mother   . Hyperlipidemia Mother   . Hypertension Mother   . Cancer Father 2860    Prostate (Metastatic) Lung, Esophagus, Stomach  . Alcohol abuse Father   . Kidney disease Brother   . Hypertension Brother   . Cancer Paternal Aunt     Breast Cancer  . Alzheimer's disease Maternal Grandmother   . Heart disease Paternal Grandmother   . Diabetes Brother   . Alcohol abuse Maternal Uncle     Social History   Social History  . Marital status: Single    Spouse name: N/A  . Number of children: N/A  . Years of education: 4412   Occupational History  . TRUCK DRIVER  Other - [employer]   Social History Main Topics  . Smoking status: Never Smoker  . Smokeless tobacco: Never  Used  . Alcohol use Yes     Comment: Rarely  . Drug use: No  . Sexual activity: Yes   Other Topics Concern  . 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 General: Alert and Oriented, No Acute Distress HEENT: Pupils equal, round, reactive to light. Conjunctivae clear.  External ears unremarkable.  Moist mucous membranes. Lungs: Clear and comfortable work of breathing, speaking in full sentences without accessory muscle use. Cardiac: Regular rate and rhythm.  Neuro: CN II-XII grossly intact, gait normal. Extremities: No peripheral edema.  Strong peripheral pulses.  Mental Status: No depression, anxiety, nor agitation. Logical though process. Skin: Warm and dry.  Assessment & Plan: Edward Riley was seen today for hyperglycemia.  Diagnoses and all orders  for this visit:  Type 2 diabetes mellitus with diabetic nephropathy, without long-term current use of insulin (HCC) -     POCT HgB A1C -     Iron and TIBC  Hyperlipidemia -     Canagliflozin-Metformin HCl [phone] MG TABS; Take 1 tablet by mouth 2 (two) times daily. -     Liraglutide (VICTOZA) 18 MG/3ML SOPN; inject 1.8 milligram (0.3MLS) subcutaneously at bedtime  Essential hypertension, benign Comments: Adding some lisinopril to lower his BP.   Orders: -     lisinopril (PRINIVIL,ZESTRIL) 20 MG tablet; Take 1 tablet (20 mg total) by mouth every morning.  Type 2 diabetes mellitus without complication, without long-term current use of insulin (HCC) -     pioglitazone (ACTOS) 45 MG tablet;  Take 1 tablet (45 mg total) by mouth daily.  Other orders -     atorvastatin (LIPITOR) 80 MG tablet; Take 1 tablet (80 mg total) by mouth daily.   Type 2 diabetes: A1c of 9, uncontrolled, we both think that compliance is the biggest obstacle right now and if he can overcome this the A1c will significantly improve. Hyperlipidemia: Continue atorvastatin Essential hypertension: Controlled continue lisinopril  Discussed with this patient that I will be resigning from my position here with [facility] in September in order to stay with my [place]. I let him know about the providers that are still accepting patients and I feel that this individual will be under great care if he/she stays [facility].  He declined a flu shot today  Return if symptoms worsen or fail to improve.

## 2016-07-15 LAB — IRON AND TIBC
%SAT: 20 % (ref 15–60)
IRON: 61 ug/dL (ref 50–180)
TIBC: 306 ug/dL (ref 250–425)
UIBC: 245 ug/dL (ref 125–400)

## 2016-07-21 ENCOUNTER — Ambulatory Visit (INDEPENDENT_AMBULATORY_CARE_PROVIDER_SITE_OTHER): Payer: BLUE CROSS/BLUE SHIELD | Admitting: Sports Medicine

## 2016-07-21 DIAGNOSIS — M7541 Impingement syndrome of right shoulder: Secondary | ICD-10-CM | POA: Diagnosis not present

## 2016-07-21 DIAGNOSIS — M7542 Impingement syndrome of left shoulder: Secondary | ICD-10-CM | POA: Diagnosis not present

## 2016-07-21 NOTE — Assessment & Plan Note (Signed)
Bilateral subacromial injections as above, he did have some glenohumeral symptoms today, return as needed. If insufficient relief we will proceed with glenohumeral injections.

## 2016-07-21 NOTE — Progress Notes (Signed)
  Subjective:    CC: Bilateral shoulder pain  HPI: This is a pleasant 50 year old male, we injected his left and right subacromial bursae approximately 7 months ago, he is now having recurrence of pain and desires repeat left and right subacromial bursa injection, pain is moderate, persistent without radiation.  Past medical history, Surgical history, Family history not pertinant except as noted below, Social history, Allergies, and medications have been entered into the medical record, reviewed, and no changes needed.   Review of Systems: No fevers, chills, night sweats, weight loss, chest pain, or shortness of breath.   Objective:    General: Well Developed, well nourished, and in no acute distress.  Neuro: Alert and oriented x3, extra-ocular muscles intact, sensation grossly intact.  HEENT: Normocephalic, atraumatic, pupils equal round reactive to light, neck supple, no masses, no lymphadenopathy, thyroid nonpalpable.  Skin: Warm and dry, no rashes. Cardiac: Regular rate and rhythm, no murmurs rubs or gallops, no lower extremity edema.  Respiratory: Clear to auscultation bilaterally. Not using accessory muscles, speaking in full sentences.  Procedure: Real-time Ultrasound Guided Injection of left subacromial bursa Device: GE Logiq E  Verbal informed consent obtained.  Time-out conducted.  Noted no overlying erythema, induration, or other signs of local infection.  Skin prepped in a sterile fashion.  Local anesthesia: Topical Ethyl chloride.  With sterile technique and under real time ultrasound guidance:  1 mL kenalog 40, 1 mL lidocaine, 1 mL Marcaine injected easily. Completed without difficulty  Pain immediately resolved suggesting accurate placement of the medication.  Advised to call if fevers/chills, erythema, induration, drainage, or persistent bleeding.  Images permanently stored and available for review in the ultrasound unit.  Impression: Technically successful ultrasound  guided injection.  Procedure: Real-time Ultrasound Guided Injection of right subacromial bursa Device: GE Logiq E  Verbal informed consent obtained.  Time-out conducted.  Noted no overlying erythema, induration, or other signs of local infection.  Skin prepped in a sterile fashion.  Local anesthesia: Topical Ethyl chloride.  With sterile technique and under real time ultrasound guidance:  1 mL kenalog 40, 1 mL lidocaine, 1 mL Marcaine injected easily. Completed without difficulty  Pain immediately resolved suggesting accurate placement of the medication.  Advised to call if fevers/chills, erythema, induration, drainage, or persistent bleeding.  Images permanently stored and available for review in the ultrasound unit.  Impression: Technically successful ultrasound guided injection.  Impression and Recommendations:    Impingement syndrome of both shoulders Bilateral subacromial injections as above, he did have some glenohumeral symptoms today, return as needed. If insufficient relief we will proceed with glenohumeral injections.  I spent 25 minutes with this patient, greater than 50% was face-to-face time counseling regarding the above diagnoses, this was separate from the time spent performing the above procedures

## 2016-10-11 ENCOUNTER — Other Ambulatory Visit: Payer: Self-pay

## 2016-10-11 MED ORDER — LIRAGLUTIDE 18 MG/3ML ~~LOC~~ SOPN: PEN_INJECTOR | SUBCUTANEOUS | 0 refills | Status: DC

## 2016-11-10 ENCOUNTER — Ambulatory Visit (INDEPENDENT_AMBULATORY_CARE_PROVIDER_SITE_OTHER): Payer: BLUE CROSS/BLUE SHIELD | Admitting: Sports Medicine

## 2016-11-10 ENCOUNTER — Encounter: Payer: Self-pay | Admitting: Sports Medicine

## 2016-11-10 DIAGNOSIS — M7542 Impingement syndrome of left shoulder: Secondary | ICD-10-CM

## 2016-11-10 DIAGNOSIS — M7541 Impingement syndrome of right shoulder: Secondary | ICD-10-CM | POA: Diagnosis not present

## 2016-11-10 NOTE — Progress Notes (Signed)
  Subjective:    CC: Bilateral shoulder pain  HPI: 4 months ago I gave this pleasant 50 year old male bilateral subacromial injections, he did extremely well, but admits that he has been slacking off on his rehabilitation exercises. He now has a recurrence of pain localized over the deltoid, worse with overhead activities, moderate, persistent without radiation.  Past medical history:  Negative.  See flowsheet/record as well for more information.  Surgical history: Negative.  See flowsheet/record as well for more information.  Family history: Negative.  See flowsheet/record as well for more information.  Social history: Negative.  See flowsheet/record as well for more information.  Allergies, and medications have been entered into the medical record, reviewed, and no changes needed.   Review of Systems: No fevers, chills, night sweats, weight loss, chest pain, or shortness of breath.   Objective:    General: Well Developed, well nourished, and in no acute distress.  Neuro: Alert and oriented x3, extra-ocular muscles intact, sensation grossly intact.  HEENT: Normocephalic, atraumatic, pupils equal round reactive to light, neck supple, no masses, no lymphadenopathy, thyroid nonpalpable.  Skin: Warm and dry, no rashes. Cardiac: Regular rate and rhythm, no murmurs rubs or gallops, no lower extremity edema.  Respiratory: Clear to auscultation bilaterally. Not using accessory muscles, speaking in full sentences. Bilateral shoulders: Inspection reveals no abnormalities, atrophy or asymmetry. Palpation is normal with no tenderness over AC joint or bicipital groove. ROM is full in all planes. Rotator cuff strength normal throughout. Positive Neer and Hawkin's tests, empty can. Speeds and Yergason's tests normal. No labral pathology noted with negative Obrien's, negative crank, negative clunk, and good stability. Normal scapular function observed. No painful arc and no drop arm sign. No  apprehension sign  Procedure: Real-time Ultrasound Guided Injection of right subacromial bursa Device: GE Logiq E  Verbal informed consent obtained.  Time-out conducted.  Noted no overlying erythema, induration, or other signs of local infection.  Skin prepped in a sterile fashion.  Local anesthesia: Topical Ethyl chloride.  With sterile technique and under real time ultrasound guidance:  1 mL kenalog 40, 1 mL lidocaine, 1 mL Marcaine injected easily. Completed without difficulty  Pain immediately resolved suggesting accurate placement of the medication.  Advised to call if fevers/chills, erythema, induration, drainage, or persistent bleeding.  Images permanently stored and available for review in the ultrasound unit.  Impression: Technically successful ultrasound guided injection.  Procedure: Real-time Ultrasound Guided Injection of left subacromial bursa Device: GE Logiq E  Verbal informed consent obtained.  Time-out conducted.  Noted no overlying erythema, induration, or other signs of local infection.  Skin prepped in a sterile fashion.  Local anesthesia: Topical Ethyl chloride.  With sterile technique and under real time ultrasound guidance:  1 mL kenalog 40, 1 mL lidocaine, 1 mL Marcaine injected easily. Completed without difficulty  Pain immediately resolved suggesting accurate placement of the medication.  Advised to call if fevers/chills, erythema, induration, drainage, or persistent bleeding.  Images permanently stored and available for review in the ultrasound unit.  Impression: Technically successful ultrasound guided injection.  Impression and Recommendations:    Impingement syndrome of both shoulders Has been for months since previous subacromial injections, repeat bilateral injections today, he agrees to get back with his rehabilitation exercises.

## 2016-11-10 NOTE — Assessment & Plan Note (Addendum)
Has been for months since previous subacromial injections, repeat bilateral injections today, he agrees to get back with his rehabilitation exercises.

## 2016-11-14 ENCOUNTER — Other Ambulatory Visit: Payer: Self-pay

## 2016-11-14 MED ORDER — GLUCOSE BLOOD VI STRP: ORAL_STRIP | 99 refills | Status: DC

## 2016-11-14 MED ORDER — LANCET DEVICE MISC: 99 refills | Status: AC

## 2016-11-17 ENCOUNTER — Encounter: Payer: Self-pay | Admitting: Family Medicine

## 2016-11-17 ENCOUNTER — Ambulatory Visit (INDEPENDENT_AMBULATORY_CARE_PROVIDER_SITE_OTHER): Payer: BLUE CROSS/BLUE SHIELD | Admitting: Family Medicine

## 2016-11-17 VITALS — BP 118/66 | HR 97 | Wt 242.0 lb

## 2016-11-17 DIAGNOSIS — E559 Vitamin D deficiency, unspecified: Secondary | ICD-10-CM

## 2016-11-17 DIAGNOSIS — N529 Male erectile dysfunction, unspecified: Secondary | ICD-10-CM

## 2016-11-17 DIAGNOSIS — Z23 Encounter for immunization: Secondary | ICD-10-CM

## 2016-11-17 DIAGNOSIS — E1121 Type 2 diabetes mellitus with diabetic nephropathy: Secondary | ICD-10-CM | POA: Diagnosis not present

## 2016-11-17 DIAGNOSIS — Z125 Encounter for screening for malignant neoplasm of prostate: Secondary | ICD-10-CM

## 2016-11-17 DIAGNOSIS — E782 Mixed hyperlipidemia: Secondary | ICD-10-CM

## 2016-11-17 DIAGNOSIS — I10 Essential (primary) hypertension: Secondary | ICD-10-CM | POA: Diagnosis not present

## 2016-11-17 DIAGNOSIS — Z8042 Family history of malignant neoplasm of prostate: Secondary | ICD-10-CM

## 2016-11-17 LAB — LIPID PANEL
CHOLESTEROL: 216 mg/dL — AB (ref <200)
HDL: 43 mg/dL (ref >40)
LDL Cholesterol: 144 mg/dL — ABNORMAL HIGH (ref <100)
TRIGLYCERIDES: 146 mg/dL (ref <150)
Total CHOL/HDL Ratio: 5 Ratio — ABNORMAL HIGH (ref <5.0)
VLDL: 29 mg/dL (ref <30)

## 2016-11-17 LAB — CBC
HCT: 44.5 % (ref 38.5–50.0)
Hemoglobin: 14.9 g/dL (ref 13.2–17.1)
MCH: 30.8 pg (ref 27.0–33.0)
MCHC: 33.5 g/dL (ref 32.0–36.0)
MCV: 92.1 fL (ref 80.0–100.0)
MPV: 11 fL (ref 7.5–12.5)
Platelets: 251 10*3/uL (ref 140–400)
RBC: 4.83 MIL/uL (ref 4.20–5.80)
RDW: 12.8 % (ref 11.0–15.0)
WBC: 10.6 10*3/uL (ref 3.8–10.8)

## 2016-11-17 LAB — COMPLETE METABOLIC PANEL WITH GFR
ALBUMIN: 4.6 g/dL (ref 3.6–5.1)
ALT: 15 U/L (ref 9–46)
AST: 15 U/L (ref 10–40)
Alkaline Phosphatase: 52 U/L (ref 40–115)
BILIRUBIN TOTAL: 0.7 mg/dL (ref 0.2–1.2)
BUN: 30 mg/dL — AB (ref 7–25)
CALCIUM: 9.6 mg/dL (ref 8.6–10.3)
CO2: 22 mmol/L (ref 20–31)
CREATININE: 1.05 mg/dL (ref 0.60–1.35)
Chloride: 101 mmol/L (ref 98–110)
GFR, Est Non African American: 84 mL/min (ref >=60)
Glucose, Bld: 145 mg/dL — ABNORMAL HIGH (ref 65–99)
Potassium: 4.5 mmol/L (ref 3.5–5.3)
Sodium: 135 mmol/L (ref 135–146)
TOTAL PROTEIN: 6.8 g/dL (ref 6.1–8.1)

## 2016-11-17 LAB — PSA: PSA: 0.3 ng/mL (ref <=4.0)

## 2016-11-17 LAB — POCT GLYCOSYLATED HEMOGLOBIN (HGB A1C): HEMOGLOBIN A1C: 9.1

## 2016-11-17 MED ORDER — INSULIN GLARGINE 300 UNIT/ML ~~LOC~~ SOPN: 20.0000 [IU] | PEN_INJECTOR | Freq: Every day | SUBCUTANEOUS | 6 refills | Status: DC

## 2016-11-17 MED ORDER — SILDENAFIL CITRATE 20 MG PO TABS: 20.0000 mg | ORAL_TABLET | ORAL | 11 refills | Status: DC | PRN

## 2016-11-17 NOTE — Patient Instructions (Addendum)
Thank you for coming in today. Get fasting labs soon.  Add Toujeo 20 units daily.  Check your sugars daily and as needed especially for the first few weeks on Toujeo.  Return for recheck in 3 months.   You should see your eye doctor again soon.

## 2016-11-17 NOTE — Progress Notes (Signed)
Edward Riley is a 50 y.o. male who presents to [facility] today for follow up diabetes, HTN, HLD and ED.   1) Diabetes: Patient has a history of moderately controlled diabetes with an A1c of 9 on metformin, SGLT2, GLP1, and a glitazone. He started checking his sugars again about a week ago and notes fasting sugars around 200s. He has been using a sample of Toujeo Insulin 20 units daily for last few days and notes improved fasting blood sugar to 140. He feels well with no fever, chills, NVD.  2) hypertension: Doing well with the below medical regimen. No chest pain palpitations shortness of breath.  3) Hyperlipidemia: Patient takes 80 mg of Lipitor daily with no significant muscle aches or pain.   Past Medical History:  Diagnosis Date  . Diabetes mellitus without complication ([place])   . Hyperlipidemia   . Hypertension   . Obese   . Retinal hemorrhage, both eyes    No past surgical history on file. Social History  Substance Use Topics  . Smoking status: Never Smoker  . Smokeless tobacco: Never Used  . Alcohol use Yes     Comment: Rarely   family history includes Alcohol abuse in his father and maternal uncle; Alzheimer's disease in his maternal grandmother; Arthritis in his mother; Cancer in his paternal aunt; Cancer (age of onset: 75) in his father; Diabetes in his brother; Heart disease in his paternal grandmother; Hyperlipidemia in his mother; Hypertension in his brother and mother; Kidney disease in his brother.  ROS as above:  Medications: Current Outpatient Prescriptions  Medication Sig Dispense Refill  . AMBULATORY NON FORMULARY MEDICATION Bayer test strips and lancets. Use to check blood sugar twice a day. Dx: Type Two Diabetes Mellitus 100 Units 11  . atorvastatin (LIPITOR) 80 MG tablet Take 1 tablet (80 mg total) by mouth daily. 90 tablet 1  .  Blood Glucose Monitoring Suppl (BAYER CONTOUR MONITOR) w/Device KIT Use to check blood sugar twice a day. Dx: Type Two Diabetes Mellitus 1 kit 0  . Canagliflozin-Metformin HCl [phone] MG TABS Take 1 tablet by mouth 2 (two) times daily. 120 tablet 1  . glucose blood (BAYER CONTOUR TEST) test strip Check blood sugar once or twice daily as needed. 100 each prn  . Insulin Glargine (TOUJEO SOLOSTAR) 300 UNIT/ML SOPN Inject 20 Units into the skin daily. 4 mL 6  . Lancet Device MISC Check blood sugar once or twice daily as needed. 100 each prn  . liraglutide (VICTOZA) 18 MG/3ML SOPN inject 1.8 milligram (0.3MLS) subcutaneously at bedtime. Needs to establish care with a provider. 3 pen 0  . lisinopril (PRINIVIL,ZESTRIL) 20 MG tablet Take 1 tablet (20 mg total) by mouth every morning. 90 tablet 1  . pioglitazone (ACTOS) 45 MG tablet Take 1 tablet (45 mg total) by mouth daily. 90 tablet 1  . sildenafil (REVATIO) 20 MG tablet Take 1 tablet (20 mg total) by mouth as needed. 50 tablet 11   No current facility-administered medications for this visit.    No Known Allergies  Health Maintenance Health Maintenance  Topic Date Due  . HIV Screening  04/05/1983  . TETANUS/TDAP  04/05/1987  . PNEUMOCOCCAL POLYSACCHARIDE VACCINE (2) 11/27/2013  . OPHTHALMOLOGY EXAM  03/21/2015  . INFLUENZA VACCINE  06/27/2016  . FOOT EXAM  08/05/2016  . HEMOGLOBIN A1C  01/14/2017     Exam:  BP 118/66   Pulse 97   Wt 242  lb (109.8 kg)   BMI 35.74 kg/m  Gen: Well NAD HEENT: EOMI,  MMM Lungs: Normal work of breathing. CTABL Heart: RRR no MRG Abd: NABS, Soft. Nondistended, Nontender Exts: Brisk capillary refill, warm and well perfused.  Diabetic Foot Exam - Simple   Simple Foot Form Diabetic Foot exam was performed with the following findings:  Yes 11/17/2016  9:45 AM  Visual Inspection No deformities, no ulcerations, no other skin breakdown bilaterally:  Yes Sensation Testing Intact to touch and monofilament  testing bilaterally:  Yes Pulse Check Posterior Tibialis and Dorsalis pulse intact bilaterally:  Yes Comments    Depression screen PHQ 2/9 11/17/2016  Decreased Interest 0  Down, Depressed, Hopeless 0  PHQ - 2 Score 0      No results found for this or any previous visit (from the past 72 hour(s)). No results found.    Assessment and Plan: 50 y.o. male with  Diabetes: Not controlled. Add insulin 20 units daily. Recheck in 3 months. Commended diabetic eye exam.  Hypertension: Doing well. Check metabolic panel.  Hyperlipidemia: Check fasting lipids.  Screening prostate cancer based on family history. Check PSA  Vitamin D deficiency: Check vitamin D.  Vaccines: Given flu Tdap and pneumonia 23     Orders Placed This Encounter  Procedures  . Flu Vaccine QUAD 36+ mos IM  . Tdap vaccine greater than or equal to 7yo IM  . Pneumococcal polysaccharide vaccine 23-valent greater than or equal to 2yo subcutaneous/IM  . PSA  . CBC  . COMPLETE METABOLIC PANEL WITH GFR  . Lipid panel  . VITAMIN D 25 Hydroxy (Vit-D Deficiency, Fractures)  . POCT HgB A1C    Discussed warning signs or symptoms. Please see discharge instructions. Patient expresses understanding.  I spent 40 minutes with this patient, greater than 50% was face-to-face time counseling regarding the above diagnosis.

## 2016-11-18 LAB — VITAMIN D 25 HYDROXY (VIT D DEFICIENCY, FRACTURES): Vit D, 25-Hydroxy: 32 ng/mL (ref 30–100)

## 2016-12-17 ENCOUNTER — Other Ambulatory Visit: Payer: Self-pay | Admitting: Family Medicine

## 2016-12-18 ENCOUNTER — Other Ambulatory Visit: Payer: Self-pay

## 2016-12-18 MED ORDER — LIRAGLUTIDE 18 MG/3ML ~~LOC~~ SOPN: PEN_INJECTOR | SUBCUTANEOUS | 3 refills | Status: DC

## 2016-12-25 ENCOUNTER — Other Ambulatory Visit: Payer: Self-pay | Admitting: Family Medicine

## 2016-12-25 DIAGNOSIS — E785 Hyperlipidemia, unspecified: Secondary | ICD-10-CM

## 2016-12-27 ENCOUNTER — Other Ambulatory Visit: Payer: Self-pay

## 2016-12-27 MED ORDER — LIRAGLUTIDE 18 MG/3ML ~~LOC~~ SOPN: PEN_INJECTOR | SUBCUTANEOUS | 3 refills | Status: DC

## 2016-12-27 NOTE — Progress Notes (Signed)
Prescriptions were sent to the wrong pharmacy on 12/18/16. Contacted pt for clarity. [facility] [address] [place]

## 2016-12-27 NOTE — Telephone Encounter (Signed)
Prescriptions were sent to the wrong pharmacy on 12/18/16. Contacted pt for clarity. Refills sent to [facility] [address], [place]. 

## 2016-12-28 ENCOUNTER — Other Ambulatory Visit: Payer: Self-pay

## 2016-12-28 MED ORDER — CANAGLIFLOZIN-METFORMIN HCL 150-1000 MG PO TABS: 1.0000 | ORAL_TABLET | Freq: Two times a day (BID) | ORAL | 1 refills | Status: DC

## 2017-02-19 ENCOUNTER — Ambulatory Visit: Payer: BLUE CROSS/BLUE SHIELD | Admitting: Family Medicine

## 2017-02-24 ENCOUNTER — Other Ambulatory Visit: Payer: Self-pay | Admitting: Family Medicine

## 2017-02-24 DIAGNOSIS — E119 Type 2 diabetes mellitus without complications: Secondary | ICD-10-CM

## 2017-02-26 ENCOUNTER — Telehealth: Payer: Self-pay | Admitting: Family Medicine

## 2017-02-26 ENCOUNTER — Other Ambulatory Visit: Payer: Self-pay

## 2017-02-26 DIAGNOSIS — E119 Type 2 diabetes mellitus without complications: Secondary | ICD-10-CM

## 2017-02-26 MED ORDER — ATORVASTATIN CALCIUM 80 MG PO TABS: 80.0000 mg | ORAL_TABLET | Freq: Every day | ORAL | 1 refills | Status: DC

## 2017-02-26 MED ORDER — PIOGLITAZONE HCL 45 MG PO TABS: 45.0000 mg | ORAL_TABLET | Freq: Every day | ORAL | 1 refills | Status: DC

## 2017-02-26 NOTE — Telephone Encounter (Signed)
Dr.Corey can you please take out pts No Show from his Snap shot  02-19-17 with you because I forgot to cancel that appt as pt requested. Sorry for the inconvenience.

## 2017-02-27 NOTE — Telephone Encounter (Signed)
Done

## 2017-04-06 ENCOUNTER — Ambulatory Visit (INDEPENDENT_AMBULATORY_CARE_PROVIDER_SITE_OTHER): Payer: BLUE CROSS/BLUE SHIELD | Admitting: Family Medicine

## 2017-04-06 ENCOUNTER — Encounter: Payer: Self-pay | Admitting: Family Medicine

## 2017-04-06 VITALS — BP 125/76 | HR 95 | Wt 257.0 lb

## 2017-04-06 DIAGNOSIS — E782 Mixed hyperlipidemia: Secondary | ICD-10-CM | POA: Diagnosis not present

## 2017-04-06 DIAGNOSIS — M7542 Impingement syndrome of left shoulder: Secondary | ICD-10-CM

## 2017-04-06 DIAGNOSIS — M7541 Impingement syndrome of right shoulder: Secondary | ICD-10-CM

## 2017-04-06 DIAGNOSIS — N529 Male erectile dysfunction, unspecified: Secondary | ICD-10-CM | POA: Diagnosis not present

## 2017-04-06 DIAGNOSIS — E1121 Type 2 diabetes mellitus with diabetic nephropathy: Secondary | ICD-10-CM

## 2017-04-06 DIAGNOSIS — I10 Essential (primary) hypertension: Secondary | ICD-10-CM

## 2017-04-06 LAB — POCT GLYCOSYLATED HEMOGLOBIN (HGB A1C): HEMOGLOBIN A1C: 8

## 2017-04-06 MED ORDER — TADALAFIL 20 MG PO TABS: 20.0000 mg | ORAL_TABLET | Freq: Every day | ORAL | 12 refills | Status: DC | PRN

## 2017-04-06 MED ORDER — PEN NEEDLES 31G X 8 MM MISC: 12 refills | Status: AC

## 2017-04-06 NOTE — Progress Notes (Signed)
Edward Riley is a 51 y.o. male who presents to [facility] today for cold diabetes hypertension hyperlipidemia and left shoulder pain.  Diabetes: Patient is doing much better over the last several months. He is currently taking invoke a mat, Victoza, by mouth glitazone, and Toujeo insulin.  He takes 20 units of insulin daily. He notes his blood sugars typically around 180 in the morning. He denies polyuria polydipsia or hypoglycemic episodes. He feels well.  Hypertension doing well with lisinopril area patient denies chest pain palpitations or shortness of breath.  Hyperlipidemia: Patient is doing very well with atorvastatin. He denies muscle aches or pains.  Left shoulder pain: Patient notes continued left shoulder pain. He has a history of bilateral shoulder pain has been receiving intermittent steroid injections to the subacromial bursa. He notes the right shoulder is mildly painful but the left is much more painful. It is worse with overhead lifting and at night. He would like a repeat injection today if possible. The last injection was in December 2017.  Erectile dysfunction patient notes that he's been using sildenafil which is not very effective he would like to try Cialis which has worked well in the past     Past Medical History:  Diagnosis Date  . Diabetes mellitus without complication ([place])   . Hyperlipidemia   . Hypertension   . Obese   . Retinal hemorrhage, both eyes    No past surgical history on file. Social History  Substance Use Topics  . Smoking status: Never Smoker  . Smokeless tobacco: Never Used  . Alcohol use Yes     Comment: Rarely   family history includes Alcohol abuse in his father and maternal uncle; Alzheimer's disease in his maternal grandmother; Arthritis in his mother; Cancer in his paternal aunt; Cancer (age of onset: 57) in  his father; Diabetes in his brother; Heart disease in his paternal grandmother; Hyperlipidemia in his mother; Hypertension in his brother and mother; Kidney disease in his brother.  ROS as above:  Medications: Current Outpatient Prescriptions  Medication Sig Dispense Refill  . AMBULATORY NON FORMULARY MEDICATION Bayer test strips and lancets. Use to check blood sugar twice a day. Dx: Type Two Diabetes Mellitus 100 Units 11  . atorvastatin (LIPITOR) 80 MG tablet Take 1 tablet (80 mg total) by mouth daily. 90 tablet 1  . Blood Glucose Monitoring Suppl (BAYER CONTOUR MONITOR) w/Device KIT Use to check blood sugar twice a day. Dx: Type Two Diabetes Mellitus 1 kit 0  . Canagliflozin-Metformin HCl [phone] MG TABS Take 1 tablet by mouth 2 (two) times daily. 120 tablet 1  . glucose blood (BAYER CONTOUR TEST) test strip Check blood sugar once or twice daily as needed. 100 each prn  . Insulin Glargine (TOUJEO SOLOSTAR) 300 UNIT/ML SOPN Inject 20 Units into the skin daily. 4 mL 6  . Lancet Device MISC Check blood sugar once or twice daily as needed. 100 each prn  . liraglutide (VICTOZA) 18 MG/3ML SOPN inject 1.8 milligram (0.3MLS) subcutaneously at bedtime. Needs to establish care with a provider. 3 pen 3  . lisinopril (PRINIVIL,ZESTRIL) 20 MG tablet Take 1 tablet (20 mg total) by mouth every morning. 90 tablet 1  . pioglitazone (ACTOS) 45 MG tablet Take 1 tablet (45 mg total) by mouth daily. 90 tablet 1  . sildenafil (REVATIO) 20 MG tablet Take 1 tablet (20 mg total) by mouth as needed. 50 tablet 11  . Insulin  Pen Needle (PEN NEEDLES) 31G X 8 MM MISC Use the pen needles for both the insulin and victoza pens daily 100 each 12  . tadalafil (CIALIS) 20 MG tablet Take 1 tablet (20 mg total) by mouth daily as needed for erectile dysfunction. 10 tablet 12   No current facility-administered medications for this visit.    No Known Allergies  Health Maintenance Health Maintenance  Topic Date Due  . HIV  Screening  04/05/1983  . OPHTHALMOLOGY EXAM  03/21/2015  . HEMOGLOBIN A1C  05/18/2017  . INFLUENZA VACCINE  06/27/2017  . FOOT EXAM  11/17/2017  . TETANUS/TDAP  11/17/2026  . PNEUMOCOCCAL POLYSACCHARIDE VACCINE  Completed     Exam:  BP 125/76   Pulse 95   Wt 257 lb (116.6 kg)   BMI 37.95 kg/m  Gen: Well NAD HEENT: EOMI,  MMM Lungs: Normal work of breathing. CTABL Heart: RRR no MRG Abd: NABS, Soft. Nondistended, Nontender Exts: Brisk capillary refill, warm and well perfused.  MSK: Left shoulder normal-appearing nontender. Normal motion pain with abduction positive Hawkins and Neer's test intact strength.  Procedure: Real-time Ultrasound Guided Injection of left shoulder subacromial bursa  Device: GE Logiq E  Images permanently stored and available for review in the ultrasound unit. Verbal informed consent obtained. Discussed risks and benefits of procedure. Warned about infection bleeding damage to structures skin hypopigmentation and fat atrophy among others. Patient expresses understanding and agreement Time-out conducted.  Noted no overlying erythema, induration, or other signs of local infection.  Skin prepped in a sterile fashion.  Local anesthesia: Topical Ethyl chloride.  With sterile technique and under real time ultrasound guidance: 74m kenalog and 33mmarcaine injected easily.  Completed without difficulty  Pain immediately resolved suggesting accurate placement of the medication.  Advised to call if fevers/chills, erythema, induration, drainage, or persistent bleeding.  Images permanently stored and available for review in the ultrasound unit.  Impression: Technically successful ultrasound guided injection.     Results for orders placed or performed in visit on 04/06/17 (from the past 72 hour(s))  POCT HgB A1C     Status: Abnormal   Collection Time: 04/06/17 11:47 AM  Result Value Ref Range   Hemoglobin A1C 8.0    No results  found.    Assessment and Plan: [age].o. male with  Diabetes improved. A1c is the lowest it's been since 2016. Plan to continue to increase Toujeo. Self titration to a goal of fasting blood sugar at around 1:30 or 140.  Hypertension doing well continue current regimen.  Hyperlipidemia doing well continue current regimen.  Left shoulder pain: Subacromial bursitis. Injected today. Return as needed.  Erectile dysfunction switch to Cialis.   Orders Placed This Encounter  Procedures  . POCT HgB A1C   Meds ordered this encounter  Medications  . Insulin Pen Needle (PEN NEEDLES) 31G X 8 MM MISC    Sig: Use the pen needles for both the insulin and victoza pens daily    Dispense:  100 each    Refill:  12  . tadalafil (CIALIS) 20 MG tablet    Sig: Take 1 tablet (20 mg total) by mouth daily as needed for erectile dysfunction.    Dispense:  10 tablet    Refill:  12     Discussed warning signs or symptoms. Please see discharge instructions. Patient expresses understanding.  I spent 40 minutes with this patient, greater than 50% was face-to-face time counseling regarding the above diagnosis.

## 2017-04-06 NOTE — Patient Instructions (Addendum)
Thank you for coming in today. Call or go to the ER if you develop a large red swollen joint with extreme pain or oozing puss.  Increase the insulin to 25 units daily.  Keep titration the insulin by 2 units every 3 days if the sugars are still over 140.   Recheck in 3 months.  Work on improving diet and weight loss.   Try to avoid carbohydrates.   Call or go to the emergency room if you get worse, have trouble breathing, have chest pains, or palpitations.

## 2017-05-09 ENCOUNTER — Other Ambulatory Visit: Payer: Self-pay | Admitting: Family Medicine

## 2017-06-07 ENCOUNTER — Other Ambulatory Visit: Payer: Self-pay

## 2017-06-07 MED ORDER — CANAGLIFLOZIN-METFORMIN HCL 150-1000 MG PO TABS: 1.0000 | ORAL_TABLET | Freq: Two times a day (BID) | ORAL | 0 refills | Status: DC

## 2017-07-09 ENCOUNTER — Ambulatory Visit: Payer: BLUE CROSS/BLUE SHIELD | Admitting: Family Medicine

## 2017-08-02 ENCOUNTER — Encounter: Payer: Self-pay | Admitting: Family Medicine

## 2017-08-02 ENCOUNTER — Ambulatory Visit (INDEPENDENT_AMBULATORY_CARE_PROVIDER_SITE_OTHER): Payer: BLUE CROSS/BLUE SHIELD | Admitting: Family Medicine

## 2017-08-02 VITALS — BP 153/88 | HR 98 | Wt 254.0 lb

## 2017-08-02 DIAGNOSIS — M7542 Impingement syndrome of left shoulder: Secondary | ICD-10-CM | POA: Diagnosis not present

## 2017-08-02 DIAGNOSIS — E1121 Type 2 diabetes mellitus with diabetic nephropathy: Secondary | ICD-10-CM

## 2017-08-02 DIAGNOSIS — Z23 Encounter for immunization: Secondary | ICD-10-CM | POA: Diagnosis not present

## 2017-08-02 DIAGNOSIS — I10 Essential (primary) hypertension: Secondary | ICD-10-CM

## 2017-08-02 DIAGNOSIS — M7541 Impingement syndrome of right shoulder: Secondary | ICD-10-CM | POA: Diagnosis not present

## 2017-08-02 LAB — POCT GLYCOSYLATED HEMOGLOBIN (HGB A1C): Hemoglobin A1C: 7.9

## 2017-08-02 MED ORDER — LISINOPRIL 10 MG PO TABS: 10.0000 mg | ORAL_TABLET | Freq: Every morning | ORAL | 1 refills | Status: DC

## 2017-08-02 NOTE — Progress Notes (Signed)
 Edward Riley is a 51 y.o. male who presents to [facility] today for 3 month follow-up of diabetes management and shoulder pain.  Shoulder pain: Patient reports bilateral shoulder pain for many years. Patient reports that pain is worst in the left shoulder. He has previously received steroid injections which have provided pain relief for about 3 months. Patient works as a truckdriver and has been working on improving his posture to decrease the pain. Patient has previously had some relief with PT but is unable to attend at this time given his job requirements.  Diabetes management: Patient reports having difficulty adhering to a low-carbohydrate diet over the past 3 months. His work makes it difficult to follow-up a strict diet. He also is unable to exercise regularly but is active with his job. Patient also reports some intolerance of diabetes medications. He reports increased fatigue, especially at the end of the day. He has previously tried stopping the medications for 2-3 days and immediately feels better. Patient also reports some constipation, but denies having any change in stool color or the consistency of stool.  Hypertension: Patient reports some lightheadedness when rising from a chair. Patient states that the lightheadedness resolves after a few seconds of adjusting to standing up. He is currently taking lisinopril.   Patient denies any fevers, chills, abdominal pain, or changes in urination.     Past Medical History:  Diagnosis Date  . Diabetes mellitus without complication (HCC)   . Hyperlipidemia   . Hypertension   . Obese   . Retinal hemorrhage, both eyes    No past surgical history on file. Social History  Substance Use Topics  . Smoking status: Never Smoker  . Smokeless tobacco: Never Used  . Alcohol use Yes     Comment: Rarely     ROS:  As above   Medications: Current Outpatient Prescriptions  Medication Sig  Dispense Refill  . AMBULATORY NON FORMULARY MEDICATION Bayer test strips and lancets. Use to check blood sugar twice a day. Dx: Type Two Diabetes Mellitus 100 Units 11  . atorvastatin (LIPITOR) 80 MG tablet Take 1 tablet (80 mg total) by mouth daily. 90 tablet 1  . Blood Glucose Monitoring Suppl (BAYER CONTOUR MONITOR) w/Device KIT Use to check blood sugar twice a day. Dx: Type Two Diabetes Mellitus 1 kit 0  . Canagliflozin-Metformin HCl 150-1000 MG TABS Take 1 tablet by mouth 2 (two) times daily. 180 tablet 0  . glucose blood (BAYER CONTOUR TEST) test strip Check blood sugar once or twice daily as needed. 100 each prn  . Insulin Glargine (TOUJEO SOLOSTAR) 300 UNIT/ML SOPN Inject 20 Units into the skin daily. 4 mL 6  . Insulin Pen Needle (PEN NEEDLES) 31G X 8 MM MISC Use the pen needles for both the insulin and victoza pens daily 100 each 12  . Lancet Device MISC Check blood sugar once or twice daily as needed. 100 each prn  . pioglitazone (ACTOS) 45 MG tablet Take 1 tablet (45 mg total) by mouth daily. 90 tablet 1  . sildenafil (REVATIO) 20 MG tablet Take 1 tablet (20 mg total) by mouth as needed. 50 tablet 11  . tadalafil (CIALIS) 20 MG tablet Take 1 tablet (20 mg total) by mouth daily as needed for erectile dysfunction. 10 tablet 12  . VICTOZA 18 MG/3ML SOPN INJECT 1.8 MILLIGRAM SUBCUTANEOUSLY DAILY AT BEDTIME NEEDS TO ESTABLISH CARE WITH A PROVIDER 9 mL 0  . lisinopril (PRINIVIL,ZESTRIL) 10 MG tablet Take   1 tablet (10 mg total) by mouth every morning. 90 tablet 1   No current facility-administered medications for this visit.    No Known Allergies   Exam:  BP (!) 153/88   Pulse 98   Wt 254 lb (115.2 kg)   BMI 37.51 kg/m  General: Well Developed, well nourished, and in no acute distress.  Neuro/Psych: Alert and oriented x3, extra-ocular muscles intact, able to move all 4 extremities, sensation grossly intact. Skin: Warm and dry, no rashes noted.  Respiratory: Not using accessory  muscles, speaking in full sentences, trachea midline.  Cardiovascular: Pulses palpable, no extremity edema. Abdomen: Does not appear distended. MSK: Left shoulder: No gross deformity, bruising, or swelling on inspection No tenderness to palpation Patient is unable to fully raise arms directly above head, patient reports pain when raising arms past 90 degrees No focal deficits in strength Hawkins testing positive, open can testing reproduces pain at supraspinatus tendon  Limited musculoskeletal ultrasound of the left supraspinatus and subacromial space reveals a slightly enlarged subacromial bursa with hypoechoic change. Normal bony structures. No tears.  Procedure: Real-time Ultrasound Guided Injection of left shoulder subacromial space  Device: GE Logiq E  Images permanently stored and available for review in the ultrasound unit. Verbal informed consent obtained. Discussed risks and benefits of procedure. Warned about infection bleeding damage to structures skin hypopigmentation and fat atrophy among others. Patient expresses understanding and agreement Time-out conducted.  Noted no overlying erythema, induration, or other signs of local infection.  Skin prepped in a sterile fashion.  Local anesthesia: Topical Ethyl chloride.  With sterile technique and under real time ultrasound guidance: 40mg kenalog and 2ml marcaine injected easily.  Completed without difficulty  Pain immediately resolved suggesting accurate placement of the medication.  Advised to call if fevers/chills, erythema, induration, drainage, or persistent bleeding.  Images permanently stored and available for review in the ultrasound unit.  Impression: Technically successful ultrasound guided injection.   Results for orders placed or performed in visit on 08/02/17 (from the past 48 hour(s))  POCT HgB A1C     Status: None   Collection Time: 08/02/17  4:07 PM  Result Value Ref Range   Hemoglobin A1C 7.9     No results found.    Assessment and Plan: 51 y.o. male presenting for shoulder pain and diabetes management. Given patient's long history of shoulder pain and relief with previous shoulder injections, a steroid injection was given in the left shoulder today in clinic. Patient was instructed to follow-up in clinic if symptoms worsen or do not improve over the next few days.  Patient's A1c is about the same as last visit. Patient is continuing to make dietary changes and working to increase exercise. Patient is also having difficulty tolerating some of the diabetes medications. At this time it is difficult to determine which medication is causing his fatigue. He will try stopping one at a time for a few days at a time to see if he can identify which one is causing his fatigue. Once this is determined, alternative treatment options can be further evaluated. Patient should continue to limit his carbohydrate intake to <45g per meal and follow-up in 3 months.   For lightheadedness/HTN, patient's dose of lisinopril was cut in half. Patient will continue to monitor symptoms and see if he experiences any additional lightheadedness with this change in dose.   Flu vaccine given prior to discharge.   Orders Placed This Encounter  Procedures  . Flu Vaccine QUAD 36+ mos   IM  . Ambulatory referral to Ophthalmology    Referral Priority:   Routine    Referral Type:   Consultation    Referral Reason:   Specialty Services Required    Requested Specialty:   Ophthalmology    Number of Visits Requested:   1  . POCT HgB A1C   Meds ordered this encounter  Medications  . lisinopril (PRINIVIL,ZESTRIL) 10 MG tablet    Sig: Take 1 tablet (10 mg total) by mouth every morning.    Dispense:  90 tablet    Refill:  1    Discussed warning signs or symptoms. Please see discharge instructions. Patient expresses understanding.

## 2017-08-02 NOTE — Patient Instructions (Signed)
Thank you for coming in today. Reduce lisinopril to  daily.  Do a trial on and off the diabetes medicine one at a time.  I think the Victoza may be bothering you.  We may switch it to Bydureon or Trulicity. Keep up the shoulder exercises.  Work on reduced carbs.  Try to shoot for less than 45g per meal.  Recheck in 3 months or so or sooner if needed.

## 2017-09-07 ENCOUNTER — Telehealth: Payer: Self-pay | Admitting: Family Medicine

## 2017-09-07 DIAGNOSIS — I10 Essential (primary) hypertension: Secondary | ICD-10-CM

## 2017-09-07 MED ORDER — LISINOPRIL 10 MG PO TABS: 10.0000 mg | ORAL_TABLET | Freq: Every morning | ORAL | 1 refills | Status: DC

## 2017-09-07 NOTE — Telephone Encounter (Signed)
Lisinopril refilled.

## 2017-09-09 ENCOUNTER — Other Ambulatory Visit: Payer: Self-pay | Admitting: Family Medicine

## 2017-10-07 IMAGING — CR DG SHOULDER 2+V*R*
3 series · 3 of 3 positions shown · non-contrast
Comparison: All

CLINICAL DATA: Right shoulder pain, chronic.

EXAM:
RIGHT SHOULDER - 2+ VIEW

[shoulder grashey]
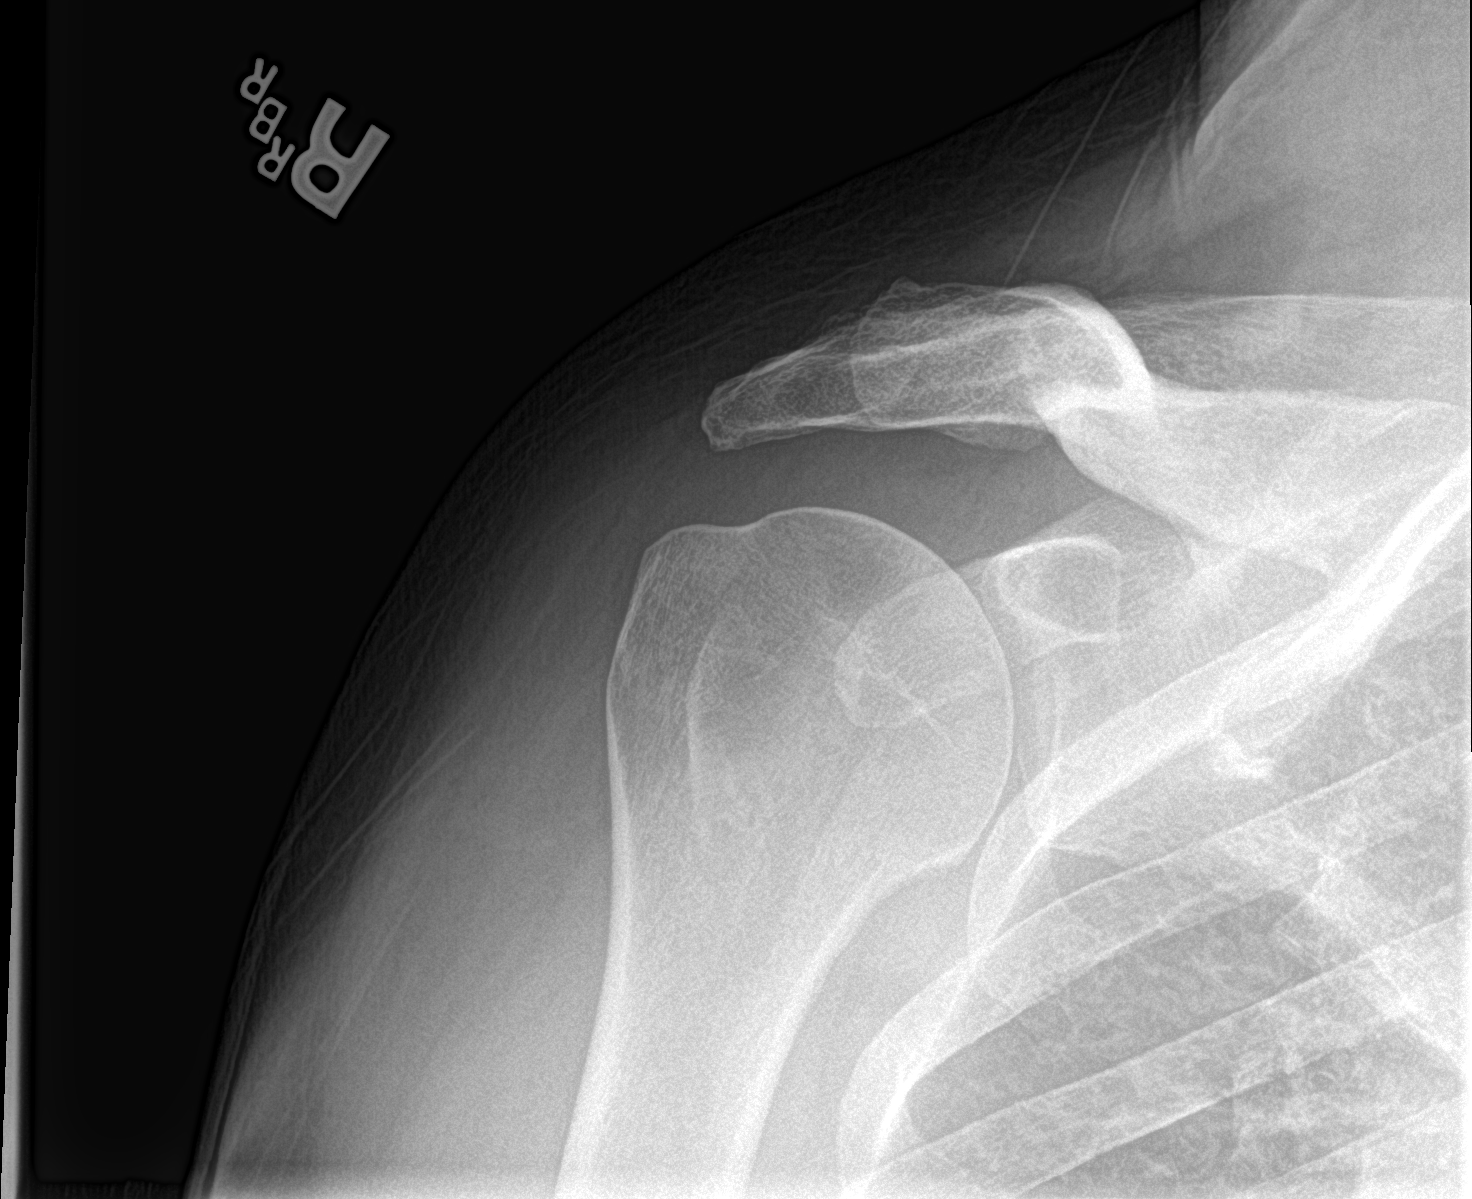

[shoulder y view]
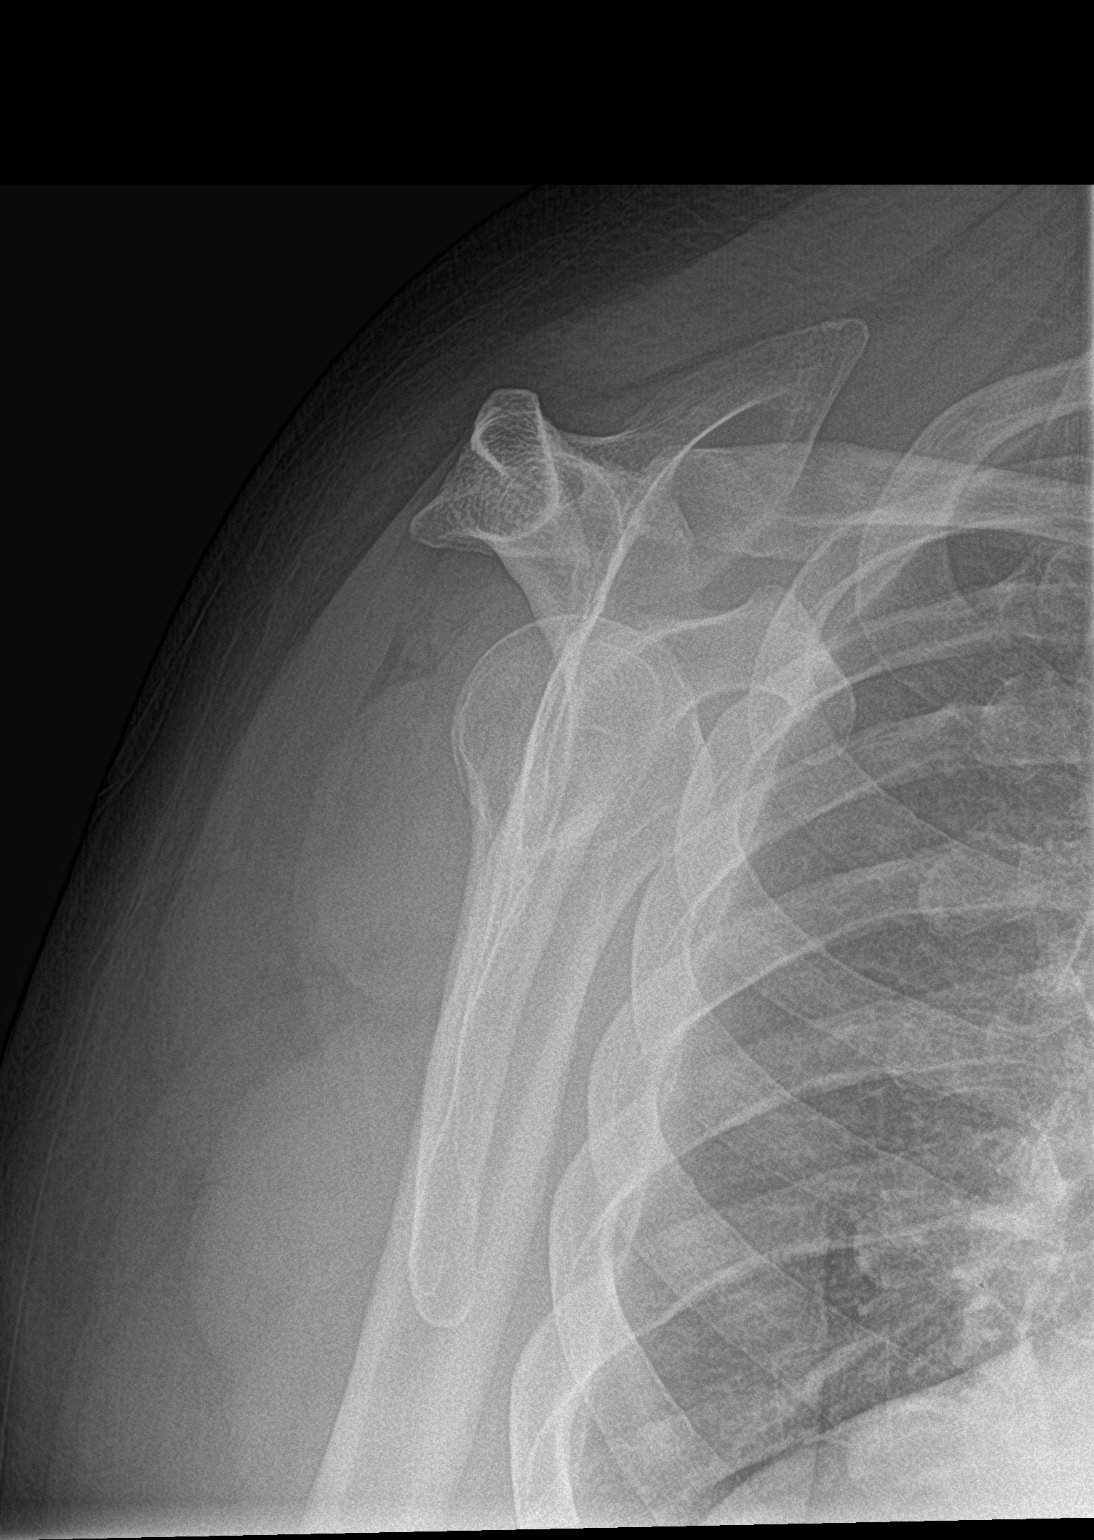

[shoulder axillary]
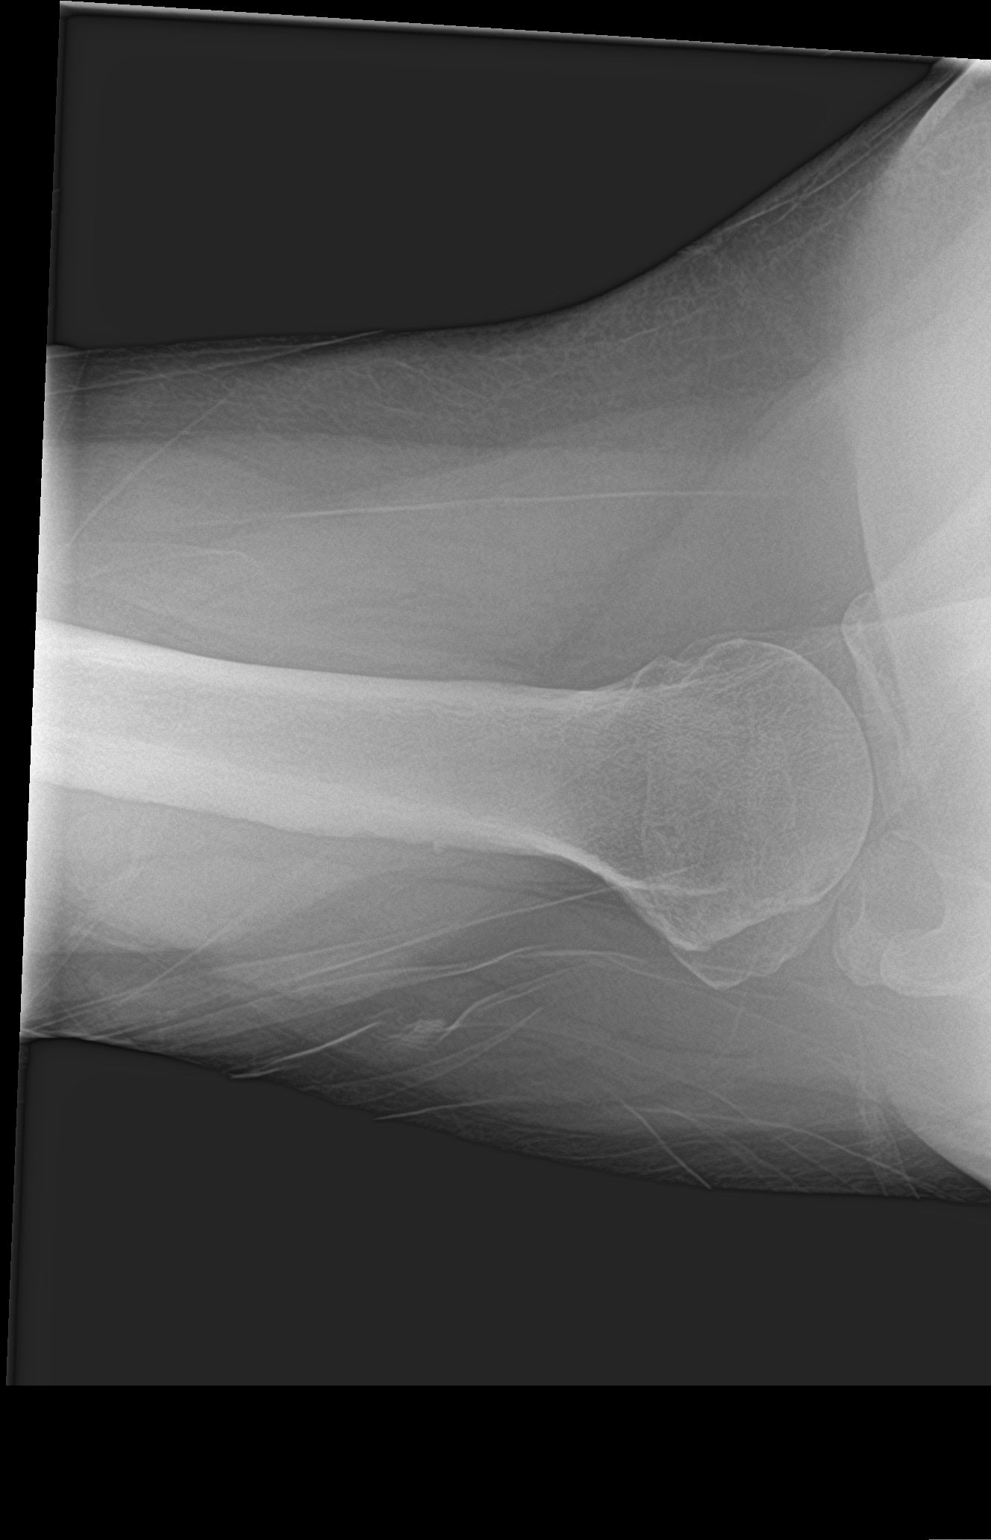

[3 of 3 positions shown; findings below may reference images not displayed]

FINDINGS: There is no evidence of fracture or dislocation. There is no
evidence of arthropathy or other focal bone abnormality. Soft
tissues are unremarkable.
IMPRESSION: Negative.

## 2017-10-07 IMAGING — CR DG SHOULDER 2+V*L*
3 series · 3 of 3 positions shown · non-contrast
Comparison: None.

CLINICAL DATA: Left shoulder pain for several months. No known
injury.

EXAM:
LEFT SHOULDER - 2+ VIEW

[shoulder grashey]
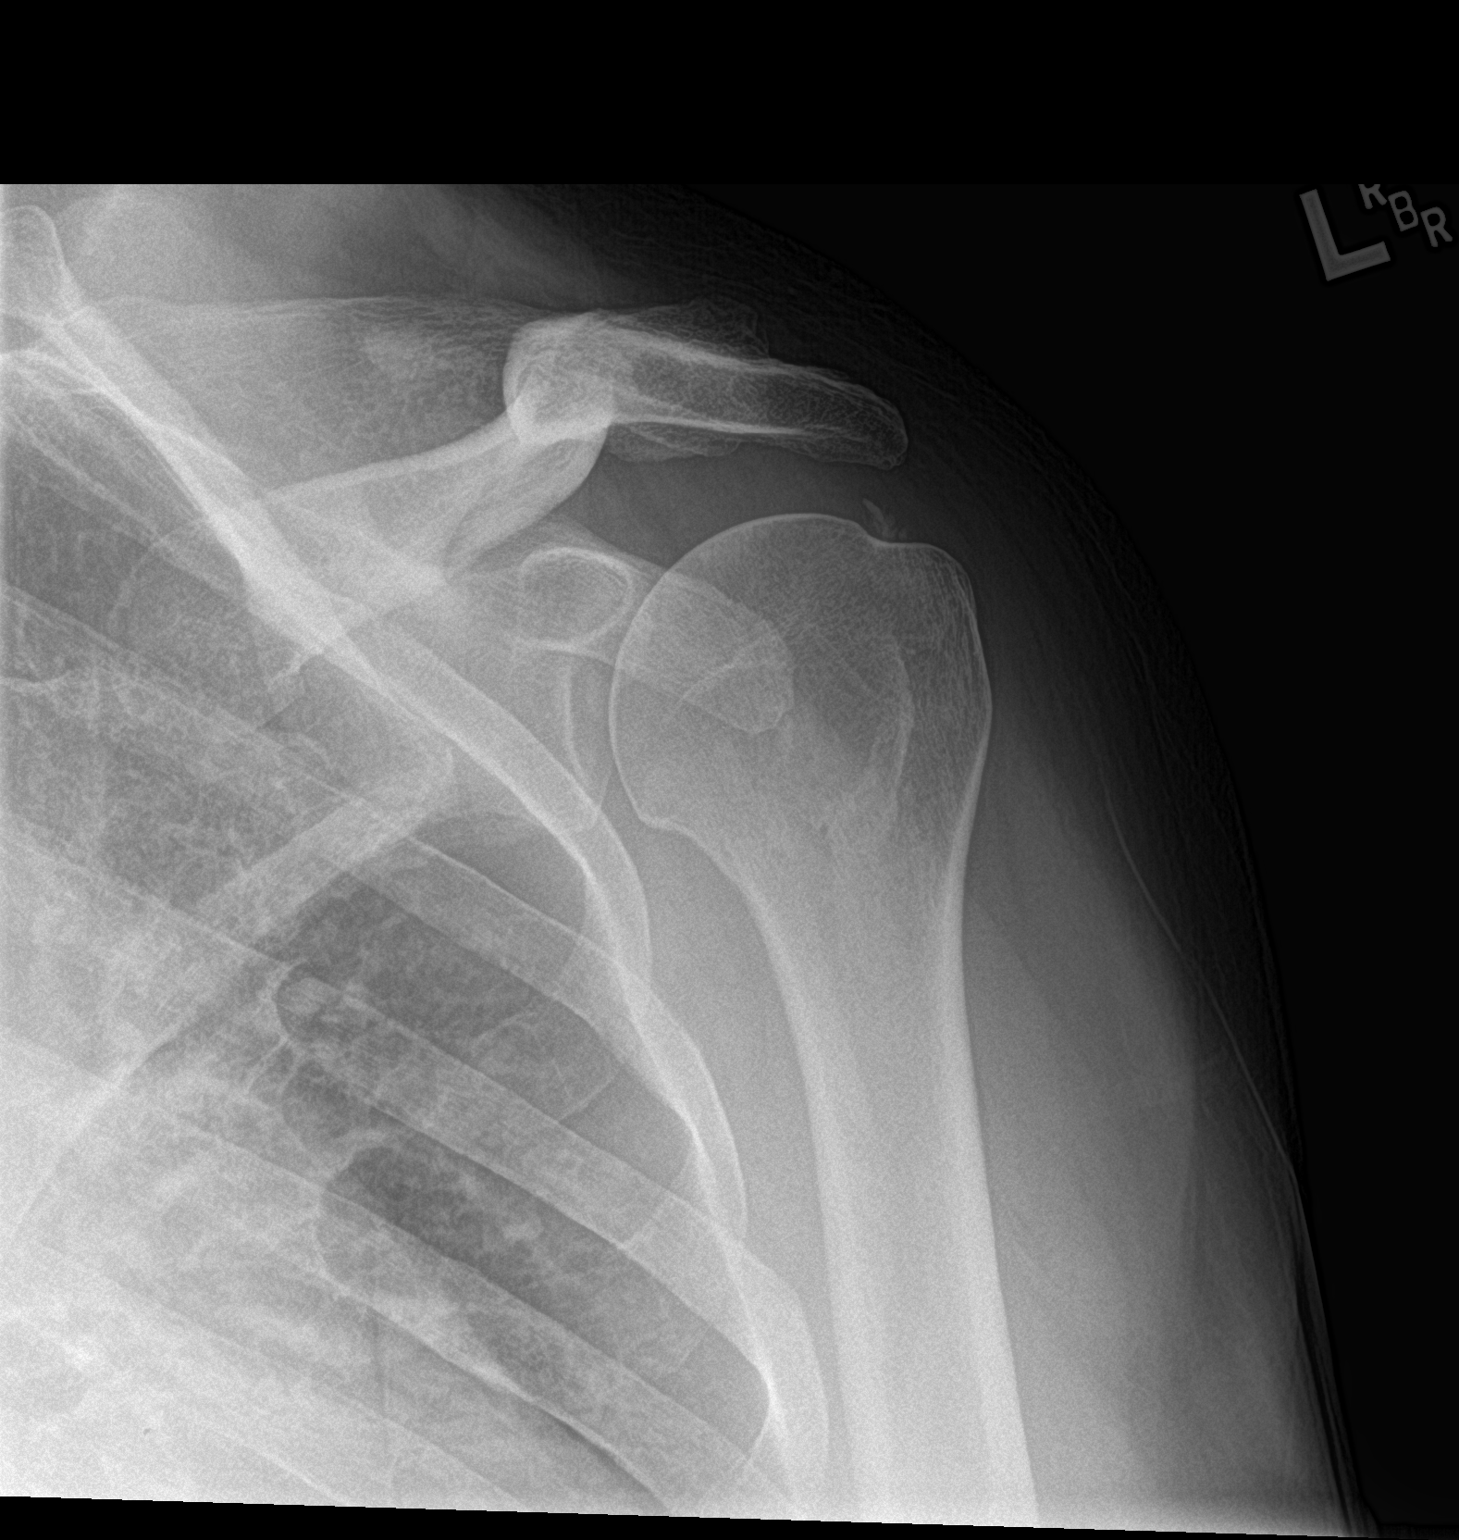

[shoulder y view]
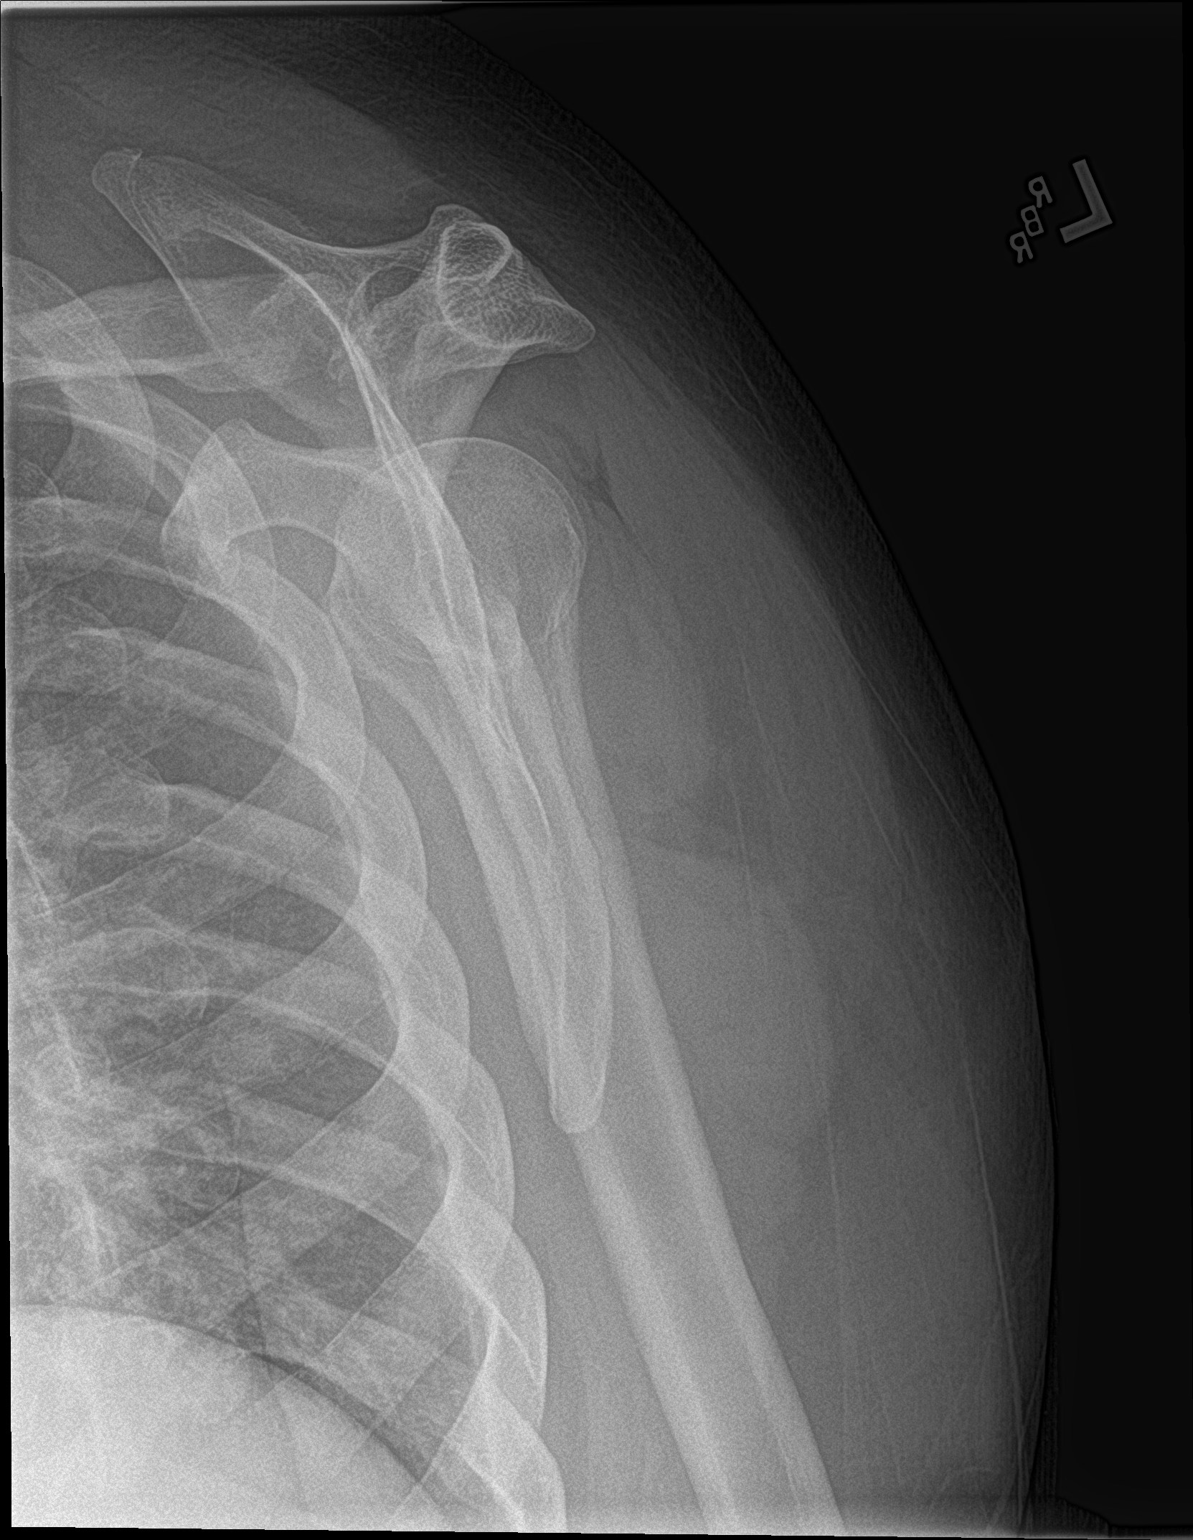

[shoulder axillary]
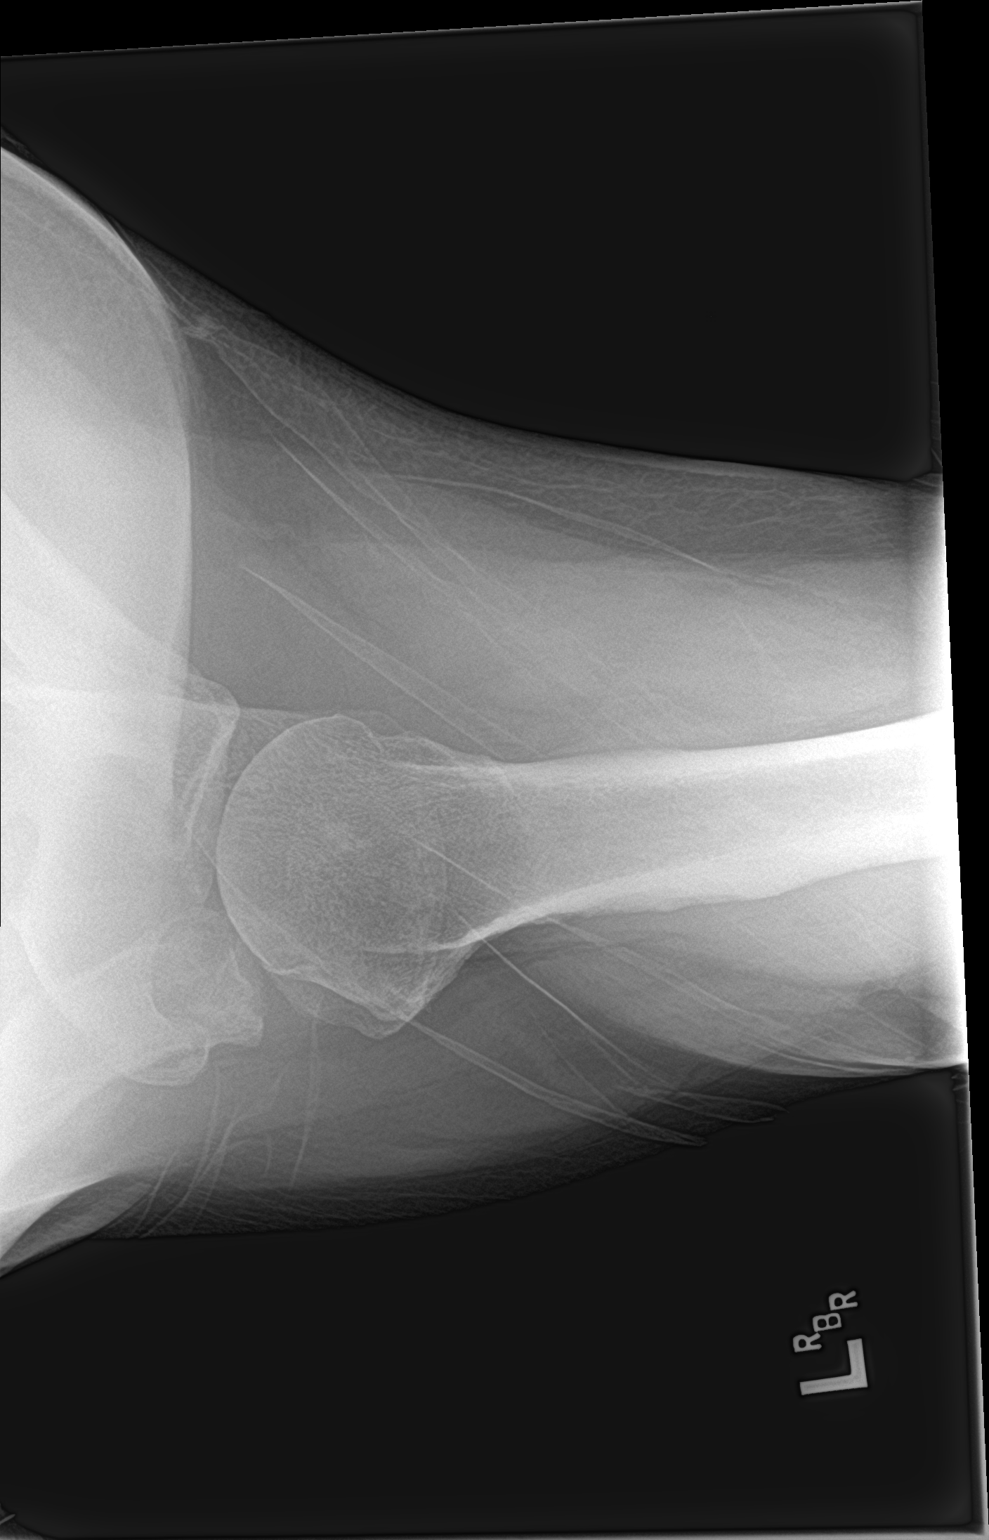

[3 of 3 positions shown; findings below may reference images not displayed]

FINDINGS: No fracture. Glenohumeral AC joints are normally spaced and aligned.

There are small calcifications projecting just above the superior
aspect of the greater tuberosity consistent with rotator cuff
calcific tendinitis. Soft tissues are otherwise unremarkable.
IMPRESSION: 1. No fracture or acute finding.
2. Calcific tendinitis of the rotator cuff tendon, most likely the
supraspinous.

## 2017-10-09 DIAGNOSIS — E113313 Type 2 diabetes mellitus with moderate nonproliferative diabetic retinopathy with macular edema, bilateral: Secondary | ICD-10-CM | POA: Diagnosis not present

## 2017-10-09 DIAGNOSIS — H02834 Dermatochalasis of left upper eyelid: Secondary | ICD-10-CM | POA: Diagnosis not present

## 2017-10-09 DIAGNOSIS — H11441 Conjunctival cysts, right eye: Secondary | ICD-10-CM | POA: Diagnosis not present

## 2017-10-09 DIAGNOSIS — H25813 Combined forms of age-related cataract, bilateral: Secondary | ICD-10-CM | POA: Diagnosis not present

## 2017-10-09 DIAGNOSIS — H527 Unspecified disorder of refraction: Secondary | ICD-10-CM | POA: Diagnosis not present

## 2017-10-09 DIAGNOSIS — H02831 Dermatochalasis of right upper eyelid: Secondary | ICD-10-CM | POA: Diagnosis not present

## 2017-10-09 LAB — HM DIABETES EYE EXAM

## 2017-10-29 ENCOUNTER — Other Ambulatory Visit: Payer: Self-pay | Admitting: Physician Assistant

## 2017-11-01 ENCOUNTER — Ambulatory Visit: Payer: BLUE CROSS/BLUE SHIELD | Admitting: Family Medicine

## 2017-11-09 ENCOUNTER — Encounter: Payer: Self-pay | Admitting: Family Medicine

## 2017-11-29 ENCOUNTER — Encounter: Payer: Self-pay | Admitting: Family Medicine

## 2017-11-29 ENCOUNTER — Ambulatory Visit: Payer: BLUE CROSS/BLUE SHIELD | Admitting: Family Medicine

## 2017-11-29 VITALS — BP 146/78 | HR 101 | Wt 267.0 lb

## 2017-11-29 DIAGNOSIS — E1121 Type 2 diabetes mellitus with diabetic nephropathy: Secondary | ICD-10-CM | POA: Diagnosis not present

## 2017-11-29 DIAGNOSIS — E782 Mixed hyperlipidemia: Secondary | ICD-10-CM

## 2017-11-29 DIAGNOSIS — M7552 Bursitis of left shoulder: Secondary | ICD-10-CM

## 2017-11-29 DIAGNOSIS — I10 Essential (primary) hypertension: Secondary | ICD-10-CM | POA: Diagnosis not present

## 2017-11-29 LAB — POCT GLYCOSYLATED HEMOGLOBIN (HGB A1C): Hemoglobin A1C: 8.4

## 2017-11-29 MED ORDER — PRAVASTATIN SODIUM 80 MG PO TABS: 80.0000 mg | ORAL_TABLET | Freq: Every day | ORAL | 3 refills | Status: DC

## 2017-11-29 MED ORDER — LISINOPRIL 20 MG PO TABS: 20.0000 mg | ORAL_TABLET | Freq: Every morning | ORAL | 1 refills | Status: DC

## 2017-11-29 MED ORDER — SEMAGLUTIDE (1 MG/DOSE) 2 MG/1.5ML ~~LOC~~ SOPN: 1.0000 mg | PEN_INJECTOR | SUBCUTANEOUS | 12 refills | Status: DC

## 2017-11-29 NOTE — Patient Instructions (Signed)
Thank you for coming in today. Get fasting labs tomorrow.  START Pravastatin for cholesterol.  STOP Victoza  START ozempic for diabetes.  Pay attention to sugar as you may need less insulin.  Increase lisinopril to 20mg  daily.  Recheck in 3 months.  We will do an injection today.

## 2017-11-30 DIAGNOSIS — I10 Essential (primary) hypertension: Secondary | ICD-10-CM | POA: Diagnosis not present

## 2017-11-30 DIAGNOSIS — E1121 Type 2 diabetes mellitus with diabetic nephropathy: Secondary | ICD-10-CM | POA: Diagnosis not present

## 2017-11-30 DIAGNOSIS — E782 Mixed hyperlipidemia: Secondary | ICD-10-CM | POA: Diagnosis not present

## 2017-11-30 NOTE — Progress Notes (Signed)
Edward Riley is a 53 y.o. male who presents to [facility] today for diabetes hypertension hyperlipidemia left shoulder pain..  Diabetes: [name] last 4 months.  He notes that he was not taking his medication regularly until recently.  Recently on his insulin, Victoza, Invokana, and metformin [name]'s blood sugar is typically 110 or so in the morning.  He denies any hypoglycemic or hyperglycemic episodes.  He is interested in switching to weekly diabetes injection if possible.  Hypertension: [name]'s that his blood pressures typically pretty well controlled at home.  He denies chest pain palpitations or shortness of breath and feels well otherwise.  [name] not tolerate Lipitor causing significant muscle aches or pains.  He no longer is taking any statin.  Left shoulder pain: Show any notes continued low shoulder pain.  He receives injections of the subacromial space about every 3 months or so.  This controls the pain pretty well and allows him to function at work.   Past Medical History:  Diagnosis Date  . Diabetes mellitus without complication ([place])   . Hyperlipidemia   . Hypertension   . Obese   . Retinal hemorrhage, both eyes    No past surgical history on file. Social History   Tobacco Use  . Smoking status: Never Smoker  . Smokeless tobacco: Never Used  Substance Use Topics  . Alcohol use: Yes    Comment: Rarely   family history includes Alcohol abuse in his father and maternal uncle; Alzheimer's disease in his maternal grandmother; Arthritis in his mother; Cancer in his paternal aunt; Cancer (age of onset: 59) in his father; Diabetes in his brother; Heart disease in his paternal grandmother; Hyperlipidemia in his mother; Hypertension in his brother and mother; Kidney disease in his brother.  ROS as above:  Medications: Current Outpatient Medications    Medication Sig Dispense Refill  . AMBULATORY NON FORMULARY MEDICATION Bayer test strips and lancets. Use to check blood sugar twice a day. Dx: Type Two Diabetes Mellitus 100 Units 11  . Blood Glucose Monitoring Suppl (BAYER CONTOUR MONITOR) w/Device KIT Use to check blood sugar twice a day. Dx: Type Two Diabetes Mellitus 1 kit 0  . CONTOUR NEXT TEST test strip CHECK BLOOD SUGAR 1 OR 2 TIMES DAILY AS NEEDED 100 each 1  . Insulin Glargine (TOUJEO SOLOSTAR) 300 UNIT/ML SOPN Inject 20 Units into the skin daily. 4 mL 6  . Insulin Pen Needle (PEN NEEDLES) 31G X 8 MM MISC Use the pen needles for both the insulin and victoza pens daily 100 each 12  . INVOKAMET [id number] MG TABS TAKE 1 TABLET BY MOUTH TWICE DAILY 180 tablet 0  . Lancet Device MISC Check blood sugar once or twice daily as needed. 100 each prn  . lisinopril (PRINIVIL,ZESTRIL) 20 MG tablet Take 1 tablet (20 mg total) by mouth every morning. 90 tablet 1  . pioglitazone (ACTOS) 45 MG tablet Take 1 tablet (45 mg total) by mouth daily. 90 tablet 1  . sildenafil (REVATIO) 20 MG tablet Take 1 tablet (20 mg total) by mouth as needed. 50 tablet 11  . tadalafil (CIALIS) 20 MG tablet Take 1 tablet (20 mg total) by mouth daily as needed for erectile dysfunction. 10 tablet 12  . pravastatin (PRAVACHOL) 80 MG tablet Take 1 tablet (80 mg total) by mouth daily. 90 tablet 3  . Semaglutide (OZEMPIC) 1 MG/DOSE SOPN Inject 1 mg into the skin once a week. 4 pen  12   No current facility-administered medications for this visit.    No Known Allergies  Health Maintenance Health Maintenance  Topic Date Due  . HIV Screening  04/05/1983  . HEMOGLOBIN A1C  05/29/2018  . OPHTHALMOLOGY EXAM  10/09/2018  . FOOT EXAM  11/29/2018  . TETANUS/TDAP  11/17/2026  . PNEUMOCOCCAL POLYSACCHARIDE VACCINE  Completed  . INFLUENZA VACCINE  Completed     Exam:  BP (!) 146/78   Pulse (!) 101   Wt 267 lb (121.1 kg)   BMI 39.43 kg/m  Gen: Well NAD HEENT: EOMI,   MMM Lungs: Normal work of breathing. CTABL Heart: RRR no MRG Abd: NABS, Soft. Nondistended, Nontender Exts: Brisk capillary refill, warm and well perfused.  Left shoulder.  Nontender normal motion.  Positive Hawkins and Neer's test.  Normal strength. Diabetic Foot Exam - Simple   Simple Foot Form Diabetic Foot exam was performed with the following findings:  Yes 11/29/2017  2:10 PM  Visual Inspection Sensation Testing Pulse Check Comments    Procedure: Real-time Ultrasound Guided Injection of left subacromial space  Device: GE Logiq E  Images permanently stored and available for review in the ultrasound unit. Verbal informed consent obtained. Discussed risks and benefits of procedure. Warned about infection bleeding damage to structures skin hypopigmentation and fat atrophy among others. Patient expresses understanding and agreement Time-out conducted.  Noted no overlying erythema, induration, or other signs of local infection.  Skin prepped in a sterile fashion.  Local anesthesia: Topical Ethyl chloride.  With sterile technique and under real time ultrasound guidance: 82m kenalog and 252mmarcaine injected easily.  Completed without difficulty  Pain immediately resolved suggesting accurate placement of the medication.  Advised to call if fevers/chills, erythema, induration, drainage, or persistent bleeding.  Images permanently stored and available for review in the ultrasound unit.  Impression: Technically successful ultrasound guided injection.     Results for orders placed or performed in visit on 11/29/17 (from the past 72 hour(s))  POCT HgB A1C     Status: None   Collection Time: 11/29/17  2:37 PM  Result Value Ref Range   Hemoglobin A1C 8.4    No results found.    Assessment and Plan: 89.o. male with  Diabetes: Not well controlled. Plan to switch from Victoza to OzBelle FontaineContinue low carb diet.  Recheck in 3 months.   HTN: Continue current medicines  and home BP checks .  HLD; Start Pravastatin. This may be better tolerated.   Shoulder pain: Subacromial bursitis.  Status post injection today.   Orders Placed This Encounter  Procedures  . CBC  . COMPLETE METABOLIC PANEL WITH GFR  . Lipid Panel w/reflex Direct LDL  . POCT HgB A1C   Meds ordered this encounter  Medications  . Semaglutide (OZEMPIC) 1 MG/DOSE SOPN    Sig: Inject 1 mg into the skin once a week.    Dispense:  4 pen    Refill:  12  . pravastatin (PRAVACHOL) 80 MG tablet    Sig: Take 1 tablet (80 mg total) by mouth daily.    Dispense:  90 tablet    Refill:  3  . lisinopril (PRINIVIL,ZESTRIL) 20 MG tablet    Sig: Take 1 tablet (20 mg total) by mouth every morning.    Dispense:  90 tablet    Refill:  1     Discussed warning signs or symptoms. Please see discharge instructions. Patient expresses understanding.

## 2017-12-01 LAB — COMPLETE METABOLIC PANEL WITH GFR
AG Ratio: 2.1 (calc) (ref 1.0–2.5)
ALT: 14 U/L (ref 9–46)
AST: 14 U/L (ref 10–40)
Albumin: 4.6 g/dL (ref 3.6–5.1)
Alkaline phosphatase (APISO): 63 U/L (ref 40–115)
BUN: 25 mg/dL (ref 7–25)
CALCIUM: 10.2 mg/dL (ref 8.6–10.3)
CO2: 32 mmol/L (ref 20–32)
Chloride: 99 mmol/L (ref 98–110)
Creat: 1 mg/dL (ref 0.60–1.35)
GFR, EST AFRICAN AMERICAN: 102 mL/min/{1.73_m2} (ref >=60)
GFR, EST NON AFRICAN AMERICAN: 88 mL/min/{1.73_m2} (ref >=60)
GLUCOSE: 222 mg/dL — AB (ref 65–99)
Globulin: 2.2 g/dL (calc) (ref 1.9–3.7)
Potassium: 5.4 mmol/L — ABNORMAL HIGH (ref 3.5–5.3)
Sodium: 137 mmol/L (ref 135–146)
TOTAL PROTEIN: 6.8 g/dL (ref 6.1–8.1)
Total Bilirubin: 0.4 mg/dL (ref 0.2–1.2)

## 2017-12-01 LAB — CBC
HCT: 41.8 % (ref 38.5–50.0)
HEMOGLOBIN: 14.3 g/dL (ref 13.2–17.1)
MCH: 30.5 pg (ref 27.0–33.0)
MCHC: 34.2 g/dL (ref 32.0–36.0)
MCV: 89.1 fL (ref 80.0–100.0)
MPV: 11.7 fL (ref 7.5–12.5)
PLATELETS: 211 10*3/uL (ref 140–400)
RBC: 4.69 10*6/uL (ref 4.20–5.80)
RDW: 12.2 % (ref 11.0–15.0)
WBC: 9.1 10*3/uL (ref 3.8–10.8)

## 2017-12-01 LAB — LIPID PANEL W/REFLEX DIRECT LDL
Cholesterol: 246 mg/dL — ABNORMAL HIGH (ref <200)
HDL: 42 mg/dL (ref >40)
LDL CHOLESTEROL (CALC): 172 mg/dL — AB
NON-HDL CHOLESTEROL (CALC): 204 mg/dL — AB (ref <130)
TRIGLYCERIDES: 175 mg/dL — AB (ref <150)
Total CHOL/HDL Ratio: 5.9 (calc) — ABNORMAL HIGH (ref <5.0)

## 2017-12-07 ENCOUNTER — Other Ambulatory Visit: Payer: Self-pay

## 2017-12-07 DIAGNOSIS — E119 Type 2 diabetes mellitus without complications: Secondary | ICD-10-CM

## 2017-12-07 MED ORDER — INSULIN GLARGINE 300 UNIT/ML ~~LOC~~ SOPN
20.0000 [IU] | PEN_INJECTOR | Freq: Every day | SUBCUTANEOUS | 6 refills | Status: DC
Start: 2017-12-07 — End: 2018-12-09

## 2017-12-14 ENCOUNTER — Other Ambulatory Visit: Payer: Self-pay | Admitting: Family Medicine

## 2017-12-28 ENCOUNTER — Other Ambulatory Visit: Payer: Self-pay | Admitting: Family Medicine

## 2018-02-20 ENCOUNTER — Other Ambulatory Visit: Payer: Self-pay | Admitting: Family Medicine

## 2018-02-20 DIAGNOSIS — E119 Type 2 diabetes mellitus without complications: Secondary | ICD-10-CM

## 2018-03-04 ENCOUNTER — Ambulatory Visit: Payer: BLUE CROSS/BLUE SHIELD | Admitting: Family Medicine

## 2018-03-08 ENCOUNTER — Encounter: Payer: Self-pay | Admitting: Family Medicine

## 2018-03-08 ENCOUNTER — Ambulatory Visit: Payer: BLUE CROSS/BLUE SHIELD | Admitting: Family Medicine

## 2018-03-08 VITALS — BP 135/93 | HR 116 | Wt 267.0 lb

## 2018-03-08 DIAGNOSIS — E11319 Type 2 diabetes mellitus with unspecified diabetic retinopathy without macular edema: Secondary | ICD-10-CM | POA: Insufficient documentation

## 2018-03-08 DIAGNOSIS — I10 Essential (primary) hypertension: Secondary | ICD-10-CM

## 2018-03-08 DIAGNOSIS — E1121 Type 2 diabetes mellitus with diabetic nephropathy: Secondary | ICD-10-CM | POA: Diagnosis not present

## 2018-03-08 DIAGNOSIS — E113553 Type 2 diabetes mellitus with stable proliferative diabetic retinopathy, bilateral: Secondary | ICD-10-CM

## 2018-03-08 DIAGNOSIS — M7542 Impingement syndrome of left shoulder: Secondary | ICD-10-CM

## 2018-03-08 DIAGNOSIS — M7541 Impingement syndrome of right shoulder: Secondary | ICD-10-CM

## 2018-03-08 DIAGNOSIS — Z794 Long term (current) use of insulin: Secondary | ICD-10-CM

## 2018-03-08 LAB — POCT GLYCOSYLATED HEMOGLOBIN (HGB A1C): HEMOGLOBIN A1C: 8.4

## 2018-03-08 MED ORDER — FREESTYLE LIBRE SENSOR SYSTEM MISC: 12 refills | Status: DC

## 2018-03-08 NOTE — Patient Instructions (Signed)
Thank you for coming in today. Continue current medicines.  You should hear about the new meter.  When you get it schedule a nurse visit to learn how to use the meter.  Recheck with me in 3 months or sooner if needed.

## 2018-03-08 NOTE — Progress Notes (Signed)
Edward Riley is a 52 y.o. male who presents to [place] today for left shoulder pain, diabetes, hypertension.  [name] notes continued bilateral left worse than right shoulder pain.  He has been diagnosed with rotator cuff tendinopathy and has calcific tendinitis supraspinatus tendon on x-ray in 2017.  He was seen 3 months ago where he received an injection which did not help much.  He notes previously subacromial injections have helped a lot.  He is doing some home exercise program as well.  He denies fevers or chills nausea vomiting or diarrhea.  Diabetes: [name] notes that his blood sugars have not been particularly well controlled.  He does not check his sugars regularly but does take the medications listed below.  Not sure what his sugars do after meals.  Additionally he notes that he has been exercising less and eating more poorly recently.  Hypertension: [name] takes medications listed below with no chest pain palpitations shortness of breath lightheadedness or dizziness.  He does not check his blood pressure at home.   Past Medical History:  Diagnosis Date  . Diabetes mellitus without complication ([place])   . Hyperlipidemia   . Hypertension   . Obese   . Retinal hemorrhage, both eyes    No past surgical history on file. Social History   Tobacco Use  . Smoking status: Never Smoker  . Smokeless tobacco: Never Used  Substance Use Topics  . Alcohol use: Yes    Comment: Rarely   family history includes Alcohol abuse in his father and maternal uncle; Alzheimer's disease in his maternal grandmother; Arthritis in his mother; Cancer in his paternal aunt; Cancer (age of onset: 47) in his father; Diabetes in his brother; Heart disease in his paternal grandmother; Hyperlipidemia in his mother; Hypertension in his brother and mother; Kidney disease in his  brother.  ROS as above:  Medications: Current Outpatient Medications  Medication Sig Dispense Refill  . AMBULATORY NON FORMULARY MEDICATION Bayer test strips and lancets. Use to check blood sugar twice a day. Dx: Type Two Diabetes Mellitus 100 Units 11  . Blood Glucose Monitoring Suppl (BAYER CONTOUR MONITOR) w/Device KIT Use to check blood sugar twice a day. Dx: Type Two Diabetes Mellitus 1 kit 0  . CONTOUR NEXT TEST test strip CHECK BLOOD SUGAR 1 OR 2 TIMES DAILY AS NEEDED 100 each 0  . Insulin Glargine (TOUJEO SOLOSTAR) 300 UNIT/ML SOPN Inject 20 Units into the skin daily. 4 mL 6  . Insulin Pen Needle (PEN NEEDLES) 31G X 8 MM MISC Use the pen needles for both the insulin and victoza pens daily 100 each 12  . INVOKAMET [id number] MG TABS TAKE 1 TABLET BY MOUTH TWICE DAILY 180 tablet 0  . Lancet Device MISC Check blood sugar once or twice daily as needed. 100 each prn  . lisinopril (PRINIVIL,ZESTRIL) 20 MG tablet Take 1 tablet (20 mg total) by mouth every morning. 90 tablet 1  . pioglitazone (ACTOS) 45 MG tablet TAKE 1 TABLET BY MOUTH ONCE DAILY 90 tablet 0  . pravastatin (PRAVACHOL) 80 MG tablet Take 1 tablet (80 mg total) by mouth daily. 90 tablet 3  . Semaglutide (OZEMPIC) 1 MG/DOSE SOPN Inject 1 mg into the skin once a week. 4 pen 12  . Continuous Blood Gluc Sensor (FREESTYLE LIBRE SENSOR SYSTEM) MISC Use as directed. Change device every 2 weeks 1 each 12   No current facility-administered medications for this visit.  No Known Allergies  Health Maintenance Health Maintenance  Topic Date Due  . HIV Screening  04/05/1983  . HEMOGLOBIN A1C  05/29/2018  . INFLUENZA VACCINE  06/27/2018  . OPHTHALMOLOGY EXAM  10/09/2018  . FOOT EXAM  11/29/2018  . TETANUS/TDAP  11/17/2026  . PNEUMOCOCCAL POLYSACCHARIDE VACCINE  Completed     Exam:  BP (!) 135/93   Pulse (!) 116   Wt 267 lb (121.1 kg)   BMI 39.43 kg/m  Gen: Well NAD HEENT: EOMI,  MMM Lungs: Normal work of breathing.  CTABL Heart: RRR no MRG heart rate 86 bpm per my check Abd: NABS, Soft. Nondistended, Nontender Exts: Brisk capillary refill, warm and well perfused.  Left shoulder: Normal-appearing nontender. Range of motion abduction limited to 130 degrees active and passive by pain. External rotation 30 degrees be in the neutral position internal rotation to the iliac crest. Strength is diminished 4/5 abduction normal external and internal rotation strength Hawkins and Neer's test. Positive empty can test. Negative Yergason's and speeds test  Procedure: Real-time Ultrasound Guided Injection of left subacromial bursa Device: GE Logiq E   Images permanently stored and available for review in the ultrasound unit. Verbal informed consent obtained.  Discussed risks and benefits of procedure. Warned about infection bleeding damage to structures skin hypopigmentation and fat atrophy among others. Patient expresses understanding and agreement Time-out conducted.   Noted no overlying erythema, induration, or other signs of local infection.   Skin prepped in a sterile fashion.   Local anesthesia: Topical Ethyl chloride.   With sterile technique and under real time ultrasound guidance:  40 mg of Kenalog and 3 mL of Marcaine injected easily.   Completed without difficulty   Pain immediately resolved suggesting accurate placement of the medication.   Advised to call if fevers/chills, erythema, induration, drainage, or persistent bleeding.   Images permanently stored and available for review in the ultrasound unit.  Impression: Technically successful ultrasound guided injection.   EXAM: LEFT SHOULDER - 2+ VIEW  COMPARISON:  None.  FINDINGS: No fracture. Glenohumeral AC joints are normally spaced and aligned.  There are small calcifications projecting just above the superior aspect of the greater tuberosity consistent with rotator cuff calcific tendinitis. Soft tissues are otherwise  unremarkable.  IMPRESSION: 1. No fracture or acute finding. 2. 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   Electronically Signed   By: Lajean Manes M.D.   On: 12/06/2015 16:07 I personally (independently) visualized and performed the interpretation of the images attached in this note.   Results for orders placed or performed in visit on 03/08/18 (from the past 72 hour(s))  POCT HgB A1C     Status: None   Collection Time: 03/08/18  8:40 AM  Result Value Ref Range   Hemoglobin A1C 8.4        Assessment and Plan: 52 y.o. male with  Left shoulder pain: Subacromial bursitis and tendinitis.  Injection today continue home exercise program consider surgery in the near future if not better.  Diabetes: Not well controlled.  Continue current regimen and prescribe freestyle libre continuous glucose monitor.  I think this will help patient correlate dietary intake to his postprandial blood sugars.  This will encourage improved dietary adherence which I think will improve glycemic control.  Recheck in 3 months.  Hypertension: Moderately controlled today.  Continue current regimen.  Continue to work on lifestyle changes.  Morbid obesity: Cofactors are hypertension and diabetes: Lifestyle changes as noted above.  Orders Placed This Encounter  Procedures  . POCT HgB A1C   Meds ordered this encounter  Medications  . Continuous Blood Gluc Sensor (FREESTYLE LIBRE SENSOR SYSTEM) MISC    Sig: Use as directed. Change device every 2 weeks    Dispense:  1 each    Refill:  12    Uncontrolled diabetes. Needs checks 3x daily     Discussed warning signs or symptoms. Please see discharge instructions. Patient expresses understanding.

## 2018-03-15 ENCOUNTER — Other Ambulatory Visit: Payer: Self-pay | Admitting: Family Medicine

## 2018-06-10 ENCOUNTER — Ambulatory Visit: Payer: BLUE CROSS/BLUE SHIELD | Admitting: Family Medicine

## 2018-06-12 ENCOUNTER — Other Ambulatory Visit: Payer: Self-pay | Admitting: Family Medicine

## 2018-07-18 ENCOUNTER — Encounter: Payer: Self-pay | Admitting: Family Medicine

## 2018-07-30 ENCOUNTER — Ambulatory Visit: Payer: BLUE CROSS/BLUE SHIELD | Admitting: Family Medicine

## 2018-09-06 ENCOUNTER — Encounter: Payer: Self-pay | Admitting: Family Medicine

## 2018-09-06 ENCOUNTER — Ambulatory Visit: Payer: BLUE CROSS/BLUE SHIELD | Admitting: Family Medicine

## 2018-09-06 VITALS — BP 144/77 | HR 74 | Ht 69.0 in | Wt 257.0 lb

## 2018-09-06 DIAGNOSIS — Z23 Encounter for immunization: Secondary | ICD-10-CM | POA: Diagnosis not present

## 2018-09-06 DIAGNOSIS — E1121 Type 2 diabetes mellitus with diabetic nephropathy: Secondary | ICD-10-CM

## 2018-09-06 DIAGNOSIS — I10 Essential (primary) hypertension: Secondary | ICD-10-CM

## 2018-09-06 DIAGNOSIS — Z1211 Encounter for screening for malignant neoplasm of colon: Secondary | ICD-10-CM

## 2018-09-06 DIAGNOSIS — Z125 Encounter for screening for malignant neoplasm of prostate: Secondary | ICD-10-CM | POA: Diagnosis not present

## 2018-09-06 DIAGNOSIS — E782 Mixed hyperlipidemia: Secondary | ICD-10-CM

## 2018-09-06 DIAGNOSIS — Z794 Long term (current) use of insulin: Secondary | ICD-10-CM

## 2018-09-06 DIAGNOSIS — Z8042 Family history of malignant neoplasm of prostate: Secondary | ICD-10-CM

## 2018-09-06 DIAGNOSIS — E113553 Type 2 diabetes mellitus with stable proliferative diabetic retinopathy, bilateral: Secondary | ICD-10-CM

## 2018-09-06 LAB — COMPLETE METABOLIC PANEL WITH GFR
AG RATIO: 2 (calc) (ref 1.0–2.5)
ALBUMIN MSPROF: 4.3 g/dL (ref 3.6–5.1)
ALKALINE PHOSPHATASE (APISO): 65 U/L (ref 40–115)
ALT: 14 U/L (ref 9–46)
AST: 15 U/L (ref 10–35)
BILIRUBIN TOTAL: 0.5 mg/dL (ref 0.2–1.2)
BUN: 25 mg/dL (ref 7–25)
CO2: 29 mmol/L (ref 20–32)
CREATININE: 1.04 mg/dL (ref 0.70–1.33)
Calcium: 9.7 mg/dL (ref 8.6–10.3)
Chloride: 100 mmol/L (ref 98–110)
GFR, Est African American: 97 mL/min/{1.73_m2} (ref >=60)
GFR, Est Non African American: 83 mL/min/{1.73_m2} (ref >=60)
GLOBULIN: 2.2 g/dL (ref 1.9–3.7)
Glucose, Bld: 199 mg/dL — ABNORMAL HIGH (ref 65–99)
POTASSIUM: 4.4 mmol/L (ref 3.5–5.3)
SODIUM: 137 mmol/L (ref 135–146)
Total Protein: 6.5 g/dL (ref 6.1–8.1)

## 2018-09-06 LAB — CBC
HCT: 43.3 % (ref 38.5–50.0)
HEMOGLOBIN: 14.9 g/dL (ref 13.2–17.1)
MCH: 30.3 pg (ref 27.0–33.0)
MCHC: 34.4 g/dL (ref 32.0–36.0)
MCV: 88 fL (ref 80.0–100.0)
MPV: 12.3 fL (ref 7.5–12.5)
PLATELETS: 204 10*3/uL (ref 140–400)
RBC: 4.92 10*6/uL (ref 4.20–5.80)
RDW: 12.4 % (ref 11.0–15.0)
WBC: 8 10*3/uL (ref 3.8–10.8)

## 2018-09-06 LAB — LIPID PANEL W/REFLEX DIRECT LDL
CHOL/HDL RATIO: 7.3 (calc) — AB (ref <5.0)
CHOLESTEROL: 271 mg/dL — AB (ref <200)
HDL: 37 mg/dL — AB (ref >40)
LDL CHOLESTEROL (CALC): 185 mg/dL — AB
Non-HDL Cholesterol (Calc): 234 mg/dL (calc) — ABNORMAL HIGH (ref <130)
TRIGLYCERIDES: 272 mg/dL — AB (ref <150)

## 2018-09-06 LAB — POCT GLYCOSYLATED HEMOGLOBIN (HGB A1C): Hemoglobin A1C: 8.7 % — AB (ref 4.0–5.6)

## 2018-09-06 LAB — PSA: PSA: 0.5 ng/mL (ref <=4.0)

## 2018-09-06 MED ORDER — DULAGLUTIDE 0.75 MG/0.5ML ~~LOC~~ SOAJ: 0.5000 mL | SUBCUTANEOUS | 11 refills | Status: DC

## 2018-09-06 NOTE — Progress Notes (Signed)
Edward Riley is a 52 y.o. male who presents to [place]: [facility] today for follow-up diabetes hypertension hyperlipidemia.  [name] has a history of diabetes.  He was started on Ozempic at the last visit but had difficulty tolerating it despite a slow titration.  He has discontinued this medication.  He notes his blood sugars are typically in the 140s.  He denies hyper or hypoglycemic events.  No polyuria or polydipsia.  He takes medications listed below for hypertension.  No chest pain palpitations shortness of breath lightheadedness or dizziness.  Hyperlipidemia: He has not been taking the pravastatin regularly.  He does take it occasionally and denies significant muscle aches or pains.  He also is interested in screening for both prostate and colon cancer.  He notes a family history of prostate cancer.   ROS as above:  Exam:  BP (!) 144/77   Pulse 74   Ht '5\' 9"'  (1.753 m)   Wt 257 lb (116.6 kg)   BMI 37.95 kg/m  Wt Readings from Last 5 Encounters:  09/06/18 257 lb (116.6 kg)  03/08/18 267 lb (121.1 kg)  11/29/17 267 lb (121.1 kg)  08/02/17 254 lb (115.2 kg)  04/06/17 257 lb (116.6 kg)    Gen: Well NAD HEENT: EOMI,  MMM Lungs: Normal work of breathing. CTABL Heart: RRR no MRG Abd: NABS, Soft. Nondistended, Nontender Exts: Brisk capillary refill, warm and well perfused.  Rectal exam normal-appearing anus.  Normal rectal tone.  Prostate is normal size without masses.  Lab and Radiology Results Results for orders placed or performed in visit on 09/06/18 (from the past 72 hour(s))  POCT HgB A1C     Status: Abnormal   Collection Time: 09/06/18  9:06 AM  Result Value Ref Range   Hemoglobin A1C 8.7 (A) 4.0 - 5.6 %   HbA1c POC (<> result, manual entry)     HbA1c, POC (prediabetic range)     HbA1c, POC (controlled diabetic range)     No results  found.    Assessment and Plan: 52 y.o. male with  Diabetes not ideally controlled but not fully out-of-control either.  I am hopeful that he will be able to tolerate Trulicity a bit better than Ozempic.  Will prescribe Trulicity and continue current other current diabetes regimen.  Recheck in 3 months or sooner if needed.  Hypertension: Blood pressure bit elevated today but typically better controlled at home.  Continue current regimen recheck in 3 months.  Hyperlipidemia: Check basic fasting labs today.  Patient is not very compliant with his pravastatin.  Reassess as needed in the future.  Recheck 3 months.  Prostate cancer screening: Check PSA.  Colon cancer screening: Refer to gastroenterology.  Flu vaccine given today prior to discharge.   Orders Placed This Encounter  Procedures  . Flu Vaccine QUAD 36+ mos IM  . CBC  . Lipid Panel w/reflex Direct LDL  . COMPLETE METABOLIC PANEL WITH GFR  . PSA  . Ambulatory referral to Gastroenterology    Referral Priority:   Routine    Referral Type:   Consultation    Referral Reason:   Specialty Services Required    Number of Visits Requested:   1  . POCT HgB A1C   Meds ordered this encounter  Medications  . Dulaglutide (TRULICITY) 9.37 JI/9.6VE SOPN    Sig: Inject 0.5 mLs into the skin once a week.    Dispense:  4 pen  Refill:  11    Patient will have discount coupon     Historical information moved to improve visibility of documentation.  Past Medical History:  Diagnosis Date  . Diabetes mellitus without complication ([place])   . Hyperlipidemia   . Hypertension   . Obese   . Retinal hemorrhage, both eyes    No past surgical history on file. Social History   Tobacco Use  . Smoking status: Never Smoker  . Smokeless tobacco: Never Used  Substance Use Topics  . Alcohol use: Yes    Comment: Rarely   family history includes Alcohol abuse in his father and maternal uncle; Alzheimer's disease in his maternal grandmother;  Arthritis in his mother; Cancer in his paternal aunt; Cancer (age of onset: 57) in his father; Diabetes in his brother; Heart disease in his paternal grandmother; Hyperlipidemia in his mother; Hypertension in his brother and mother; Kidney disease in his brother.  Medications: Current Outpatient Medications  Medication Sig Dispense Refill  . AMBULATORY NON FORMULARY MEDICATION Bayer test strips and lancets. Use to check blood sugar twice a day. Dx: Type Two Diabetes Mellitus 100 Units 11  . Blood Glucose Monitoring Suppl (BAYER CONTOUR MONITOR) w/Device KIT Use to check blood sugar twice a day. Dx: Type Two Diabetes Mellitus 1 kit 0  . Continuous Blood Gluc Sensor (FREESTYLE LIBRE SENSOR SYSTEM) MISC Use as directed. Change device every 2 weeks 1 each 12  . CONTOUR NEXT TEST test strip CHECK BLOOD SUGAR 1 OR 2 TIMES DAILY AS NEEDED 100 each 0  . Insulin Glargine (TOUJEO SOLOSTAR) 300 UNIT/ML SOPN Inject 20 Units into the skin daily. 4 mL 6  . Insulin Pen Needle (PEN NEEDLES) 31G X 8 MM MISC Use the pen needles for both the insulin and victoza pens daily 100 each 12  . INVOKAMET [phone] MG TABS TAKE 1 TABLET BY MOUTH TWICE DAILY 180 tablet 0  . Lancet Device MISC Check blood sugar once or twice daily as needed. 100 each prn  . lisinopril (PRINIVIL,ZESTRIL) 20 MG tablet Take 1 tablet (20 mg total) by mouth every morning. 90 tablet 1  . pioglitazone (ACTOS) 45 MG tablet TAKE 1 TABLET BY MOUTH ONCE DAILY 90 tablet 0  . pravastatin (PRAVACHOL) 80 MG tablet Take 1 tablet (80 mg total) by mouth daily. 90 tablet 3  . Dulaglutide (TRULICITY) 0.07 MA/2.6JF SOPN Inject 0.5 mLs into the skin once a week. 4 pen 11   No current facility-administered medications for this visit.    No Known Allergies   Discussed warning signs or symptoms. Please see discharge instructions. Patient expresses understanding.

## 2018-09-06 NOTE — Patient Instructions (Addendum)
Thank you for coming in today. Strart trulicity.  Recheck in 3 months.  Get labs today.  Return sooner if needed.

## 2018-09-09 MED ORDER — ATORVASTATIN CALCIUM 40 MG PO TABS: 40.0000 mg | ORAL_TABLET | Freq: Every day | ORAL | 1 refills | Status: DC

## 2018-09-09 NOTE — Addendum Note (Signed)
Addended by: Rodolph Bong on: 09/09/2018 07:08 AM   Modules accepted: Orders

## 2018-09-23 ENCOUNTER — Other Ambulatory Visit: Payer: Self-pay | Admitting: Family Medicine

## 2018-09-23 DIAGNOSIS — E119 Type 2 diabetes mellitus without complications: Secondary | ICD-10-CM

## 2018-09-23 DIAGNOSIS — I10 Essential (primary) hypertension: Secondary | ICD-10-CM

## 2018-09-25 ENCOUNTER — Other Ambulatory Visit: Payer: Self-pay | Admitting: Family Medicine

## 2018-09-30 DIAGNOSIS — M9907 Segmental and somatic dysfunction of upper extremity: Secondary | ICD-10-CM | POA: Diagnosis not present

## 2018-09-30 DIAGNOSIS — M9903 Segmental and somatic dysfunction of lumbar region: Secondary | ICD-10-CM | POA: Diagnosis not present

## 2018-09-30 DIAGNOSIS — M9904 Segmental and somatic dysfunction of sacral region: Secondary | ICD-10-CM | POA: Diagnosis not present

## 2018-10-02 ENCOUNTER — Encounter: Payer: Self-pay | Admitting: Family Medicine

## 2018-10-04 ENCOUNTER — Encounter: Payer: Self-pay | Admitting: Family Medicine

## 2018-10-04 DIAGNOSIS — N420 Calculus of prostate: Secondary | ICD-10-CM | POA: Insufficient documentation

## 2018-10-28 ENCOUNTER — Ambulatory Visit: Payer: BLUE CROSS/BLUE SHIELD | Admitting: Physician Assistant

## 2018-10-28 ENCOUNTER — Encounter: Payer: Self-pay | Admitting: Physician Assistant

## 2018-10-28 ENCOUNTER — Ambulatory Visit (INDEPENDENT_AMBULATORY_CARE_PROVIDER_SITE_OTHER): Payer: BLUE CROSS/BLUE SHIELD

## 2018-10-28 VITALS — BP 143/84 | HR 104 | Temp 99.3°F | Ht 69.0 in | Wt 264.0 lb

## 2018-10-28 DIAGNOSIS — J181 Lobar pneumonia, unspecified organism: Secondary | ICD-10-CM

## 2018-10-28 DIAGNOSIS — R05 Cough: Secondary | ICD-10-CM | POA: Diagnosis not present

## 2018-10-28 DIAGNOSIS — R5383 Other fatigue: Secondary | ICD-10-CM

## 2018-10-28 DIAGNOSIS — R52 Pain, unspecified: Secondary | ICD-10-CM

## 2018-10-28 DIAGNOSIS — R059 Cough, unspecified: Secondary | ICD-10-CM

## 2018-10-28 DIAGNOSIS — J189 Pneumonia, unspecified organism: Secondary | ICD-10-CM

## 2018-10-28 MED ORDER — BENZONATATE 200 MG PO CAPS: 200.0000 mg | ORAL_CAPSULE | Freq: Two times a day (BID) | ORAL | 0 refills | Status: DC | PRN

## 2018-10-28 MED ORDER — DOXYCYCLINE HYCLATE 100 MG PO TABS: 100.0000 mg | ORAL_TABLET | Freq: Two times a day (BID) | ORAL | 0 refills | Status: DC

## 2018-10-28 MED ORDER — ALBUTEROL SULFATE HFA 108 (90 BASE) MCG/ACT IN AERS: 2.0000 | INHALATION_SPRAY | Freq: Four times a day (QID) | RESPIRATORY_TRACT | 0 refills | Status: DC | PRN

## 2018-10-28 NOTE — Progress Notes (Signed)
Call pt: he has right lung pneumonia. Start doxycycline for 10 days. Tessalon pearls sent for cough. Rest and hydrate. Please don't work tomorrow and rest while antibiotics get in your system. pneumonia can take some time to completely resolve. I will also send over albuterol inhaler to help open up your lungs.

## 2018-10-28 NOTE — Progress Notes (Signed)
Subjective:    Patient ID: Edward Riley, male    DOB: 09/30/1966, 52 y.o.   MRN: [id number]  HPI Patient is a 52 year old male with a history of diabetes, hypertension and hyperlipidemia presenting today with complaints of cold symptoms. He states that his sore throat started one week ago. He then developed a nonproductive cough, weakness, severe fatigue, mild SOB and feverish episodes. He denies headache, nausea, vomiting, diarrhea, sinus congestion, rhinorrhea, and hearing loss.  He has tried OTC cough drops, pseudoephedrine, Dayquil, and Mucinex without relief. He reports no sick contacts, new sexual partners or sharing drinks. 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 He denies smoking and is typically a very healthy patient. He received his flu shot this year.  .. Active Ambulatory Problems    Diagnosis Date Noted  . Other and unspecified hyperlipidemia 12/08/2013  . Essential hypertension, benign 12/08/2013  . Erectile dysfunction 05/07/2015  . Hyperlipidemia 05/20/2015  . Retinal hemorrhage 05/20/2015  . 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 Type 2 diabetes mellitus with diabetic nephropathy (HCC) 08/06/2015  . Family hx of prostate cancer 12/06/2015  . Impingement syndrome of both shoulders 12/07/2015  . Foot pain, bilateral 01/06/2016  . Vitamin D deficiency 11/17/2016  . Diabetes with retinopathy (HCC) 03/08/2018  . Morbid obesity (HCC) 03/08/2018  . Calculi, prostate 10/04/2018   Resolved Ambulatory Problems    Diagnosis Date Noted  . Nausea alone 12/08/2013   Past Medical History:  Diagnosis Date  . Diabetes mellitus without complication (HCC)   . Hypertension   . Obese   . Retinal hemorrhage, both eyes       Review of Systems  Constitutional: Positive for fatigue and fever.  HENT: Positive for sore throat. Negative for congestion, ear pain, hearing loss, postnasal drip, rhinorrhea, sinus pressure, sinus pain and  sneezing.   Respiratory: Positive for cough and shortness of breath. Negative for wheezing.   Cardiovascular: Positive for chest pain.       Describes a chest tightening to the right of his sternum when he coughs.  Gastrointestinal: Negative for diarrhea, nausea and vomiting.       Objective:   Physical Exam  Constitutional: He appears well-developed and well-nourished. No distress.  HENT:  Nose: No rhinorrhea or sinus tenderness. Right sinus exhibits no maxillary sinus tenderness and no frontal sinus tenderness. Left sinus exhibits no maxillary sinus tenderness and no frontal sinus tenderness.  Mouth/Throat: Oropharynx is clear and moist. No oropharyngeal exudate.  Cardiovascular: Regular rhythm. Tachycardia present. Exam reveals no gallop and no friction rub.  No murmur heard. Pulmonary/Chest: Effort normal and breath sounds normal. No respiratory distress. He has no wheezes. He has no rales.  Psychiatric: He has a normal mood and affect. His behavior is normal. Judgment and thought content normal.          Assessment & Plan:  Edward Kitchen.Edward Kitchen.Edward Riley was seen today for cough.  Diagnoses and all orders for this visit:  Pneumonia of right lower lobe due to infectious organism (HCC) -     doxycycline (VIBRA-TABS) 100 MG tablet; Take 1 tablet (100 mg total) by mouth 2 (two) times daily. -     benzonatate (TESSALON) 200 MG capsule; Take 1 capsule (200 mg total) by mouth 2 (two) times daily as needed for cough. -     albuterol (PROVENTIL HFA;VENTOLIN HFA) 108 (90 Base) MCG/ACT inhaler; Inhale 2 puffs into the lungs every 6 (six) hours as needed for wheezing or shortness of breath.  Fatigue, unspecified type -     DG Chest 2 View -     Epstein-Barr virus VCA antibody panel -     BASIC METABOLIC PANEL WITH GFR -     CBC with Differential/Platelet  Cough -     DG Chest 2 View -     Epstein-Barr virus VCA antibody panel -     BASIC METABOLIC PANEL WITH GFR -     CBC with  Differential/Platelet -     benzonatate (TESSALON) 200 MG capsule; Take 1 capsule (200 mg total) by mouth 2 (two) times daily as needed for cough. -     albuterol (PROVENTIL HFA;VENTOLIN HFA) 108 (90 Base) MCG/ACT inhaler; Inhale 2 puffs into the lungs every 6 (six) hours as needed for wheezing or shortness of breath.  Body aches -     DG Chest 2 View -     Epstein-Barr virus VCA antibody panel -     BASIC METABOLIC PANEL WITH GFR -     CBC with Differential/Platelet   Stat chest x-ray did show right middle and lower lung pneumonia.  Patient was called back and started on doxycycline.  He was given some Tessalon Perles to help with cough during the day.  He was also given albuterol inhaler to use as needed for cough and shortness of breath.  Follow-up as needed or if symptoms worsening.  Due to body aches and fatigue will check for mono.  I would also like to check a CBC and a BMP to look at kidney function.  Strongly encourage patient to rest and hydrate for another 24 hours and then consider going back to work on Wednesday.  Edward KitchenHarlon Flor PA-C, have reviewed and agree with the above documentation in it's entirety.

## 2018-10-29 LAB — CBC WITH DIFFERENTIAL/PLATELET
BASOS ABS: 39 {cells}/uL (ref 0–200)
Basophils Relative: 0.3 %
EOS PCT: 0.9 %
Eosinophils Absolute: 116 cells/uL (ref 15–500)
HEMATOCRIT: 39.9 % (ref 38.5–50.0)
Hemoglobin: 13.9 g/dL (ref 13.2–17.1)
Lymphs Abs: 1290 cells/uL (ref 850–3900)
MCH: 30.6 pg (ref 27.0–33.0)
MCHC: 34.8 g/dL (ref 32.0–36.0)
MCV: 87.9 fL (ref 80.0–100.0)
MONOS PCT: 6.8 %
MPV: 11.4 fL (ref 7.5–12.5)
NEUTROS PCT: 82 %
Neutro Abs: 10578 cells/uL — ABNORMAL HIGH (ref 1500–7800)
PLATELETS: 237 10*3/uL (ref 140–400)
RBC: 4.54 10*6/uL (ref 4.20–5.80)
RDW: 12 % (ref 11.0–15.0)
TOTAL LYMPHOCYTE: 10 %
WBC mixed population: 877 cells/uL (ref 200–950)
WBC: 12.9 10*3/uL — ABNORMAL HIGH (ref 3.8–10.8)

## 2018-10-29 LAB — BASIC METABOLIC PANEL WITH GFR
BUN: 16 mg/dL (ref 7–25)
CALCIUM: 9.8 mg/dL (ref 8.6–10.3)
CHLORIDE: 97 mmol/L — AB (ref 98–110)
CO2: 30 mmol/L (ref 20–32)
Creat: 1 mg/dL (ref 0.70–1.33)
GFR, Est African American: 101 mL/min/{1.73_m2} (ref >=60)
GFR, Est Non African American: 87 mL/min/{1.73_m2} (ref >=60)
GLUCOSE: 218 mg/dL — AB (ref 65–99)
POTASSIUM: 4.6 mmol/L (ref 3.5–5.3)
SODIUM: 136 mmol/L (ref 135–146)

## 2018-10-29 LAB — EPSTEIN-BARR VIRUS VCA ANTIBODY PANEL
EBV NA IGG: 236 U/mL — AB
EBV VCA IgG: 279 U/mL — ABNORMAL HIGH
EBV VCA IgM: <36 U/mL

## 2018-10-29 NOTE — Progress Notes (Signed)
Call pt: you have had mono in the past but no recent exposure.

## 2018-10-29 NOTE — Progress Notes (Signed)
Call pt: WBC elevated consistent with pneumonia. Sugars are pretty elevated. Watch sugars and treat accordingly to try to get them more controlled.

## 2018-10-30 ENCOUNTER — Other Ambulatory Visit: Payer: Self-pay | Admitting: Family Medicine

## 2018-10-30 ENCOUNTER — Encounter: Payer: Self-pay | Admitting: Gastroenterology

## 2018-10-30 DIAGNOSIS — I10 Essential (primary) hypertension: Secondary | ICD-10-CM

## 2018-11-06 ENCOUNTER — Encounter: Payer: Self-pay | Admitting: *Deleted

## 2018-11-19 ENCOUNTER — Other Ambulatory Visit: Payer: Self-pay | Admitting: Physician Assistant

## 2018-11-19 DIAGNOSIS — J181 Lobar pneumonia, unspecified organism: Secondary | ICD-10-CM

## 2018-11-19 DIAGNOSIS — J189 Pneumonia, unspecified organism: Secondary | ICD-10-CM

## 2018-11-19 DIAGNOSIS — R05 Cough: Secondary | ICD-10-CM

## 2018-11-19 DIAGNOSIS — R059 Cough, unspecified: Secondary | ICD-10-CM

## 2018-11-26 ENCOUNTER — Encounter: Payer: Self-pay | Admitting: Family Medicine

## 2018-11-26 ENCOUNTER — Ambulatory Visit: Payer: BLUE CROSS/BLUE SHIELD | Admitting: Family Medicine

## 2018-11-26 VITALS — BP 153/90 | HR 80 | Ht 69.0 in | Wt 251.0 lb

## 2018-11-26 DIAGNOSIS — R05 Cough: Secondary | ICD-10-CM

## 2018-11-26 DIAGNOSIS — I10 Essential (primary) hypertension: Secondary | ICD-10-CM

## 2018-11-26 DIAGNOSIS — Z794 Long term (current) use of insulin: Secondary | ICD-10-CM

## 2018-11-26 DIAGNOSIS — R059 Cough, unspecified: Secondary | ICD-10-CM

## 2018-11-26 DIAGNOSIS — E1159 Type 2 diabetes mellitus with other circulatory complications: Secondary | ICD-10-CM | POA: Diagnosis not present

## 2018-11-26 DIAGNOSIS — E1121 Type 2 diabetes mellitus with diabetic nephropathy: Secondary | ICD-10-CM

## 2018-11-26 DIAGNOSIS — I152 Hypertension secondary to endocrine disorders: Secondary | ICD-10-CM

## 2018-11-26 DIAGNOSIS — E113553 Type 2 diabetes mellitus with stable proliferative diabetic retinopathy, bilateral: Secondary | ICD-10-CM

## 2018-11-26 DIAGNOSIS — Z1211 Encounter for screening for malignant neoplasm of colon: Secondary | ICD-10-CM

## 2018-11-26 MED ORDER — HYDROCHLOROTHIAZIDE 25 MG PO TABS: 25.0000 mg | ORAL_TABLET | Freq: Every day | ORAL | 1 refills | Status: DC

## 2018-11-26 NOTE — Progress Notes (Signed)
     Edward Riley is a 52 y.o. male who presents to [facility]: [facility] today for follow-up diabetes, hypertension, hyperlipidemia.  Diabetes:  [name] has a history of not ideally controlled diabetes.  He had difficult tolerating Ozempic and had blood sugars that are not well controlled.  I switched to Trulicity in October in the hopes that he will tolerate that better.  In the interim he has tolerated the Trulicity well and notes that his blood sugars are typically better controlled.  Blood sugars are typically in the low 100s as opposed to previously in the 140s.  He has been working on improved diet as well with less carbohydrates.  Hypertension: Patient takes 20 mg of lisinopril daily.  At the last visit in October his blood pressure was a bit elevated.  Lisinopril was increased from 10 to 20 mg.  He tolerates it well with no issues.  He notes his blood pressure is not typically on the 120s or less.  Typically they are higher at home.  No chest pain palpitations or shortness of breath.  Hyperlipidemia: The last visit cholesterol was not well controlled with pravastatin.  His LDL was about 180.  He was switched to atorvastatin.  In the interim he has tolerated it well.  Additionally he developed community-acquired pneumonia and was seen by my partner Jade Breeback PA December 2.  He notes that he is feeling much better now.  He notes a mild cough but overall is much improved.  ROS as above:  Exam:  BP (!) 153/90   Pulse 80   Ht 5' 9" (1.753 m)   Wt 251 lb (113.9 kg)   BMI 37.07 kg/m  Wt Readings from Last 5 Encounters:  11/26/18 251 lb (113.9 kg)  10/28/18 264 lb (119.7 kg)  09/06/18 257 lb (116.6 kg)  03/08/18 267 lb (121.1 kg)  11/29/17 267 lb (121.1 kg)    Gen: Well NAD HEENT: EOMI,  MMM Lungs: Normal work of breathing. CTABL Heart: RRR no MRG Abd: NABS, Soft.  Nondistended, Nontender Exts: Brisk capillary refill, warm and well perfused.   Lab and Radiology Results No results found for this or any previous visit (from the past 72 hour(s)). No results found.    Assessment and Plan: 52 y.o. male with  Diabetes improved.  Check A1c anytime after January 11.  Recheck in 3-1/2 to 4 months.  Continue improve diet.  Hypertension: Continue lisinopril 20 mg daily add hydrochlorothiazide and recheck in 3 and half months.  Hyperlipidemia: Doing well.  Check fasting lipids in about 3 months.  Colon cancer screening: Refer to digestive health specialist for local location preference.  Eye exam: Patient due for diabetic eye exam and will schedule appointment.  Cough: Likely post pneumonia.  Discussed cough drops control.   Orders Placed This Encounter  Procedures  . Hemoglobin A1c  . Ambulatory referral to Gastroenterology    Referral Priority:   Routine    Referral Type:   Consultation    Referral Reason:   Specialty Services Required    Number of Visits Requested:   1   Meds ordered this encounter  Medications  . hydrochlorothiazide (HYDRODIURIL) 25 MG tablet    Sig: Take 1 tablet (25 mg total) by mouth daily.    Dispense:  90 tablet    Refill:  1     Historical information moved to improve visibility of documentation.  Past Medical History:  Diagnosis Date  .   Diabetes mellitus without complication ([place])   . Hyperlipidemia   . Hypertension   . Obese   . Retinal hemorrhage, both eyes    No past surgical history on file. Social History   Tobacco Use  . Smoking status: Never Smoker  . Smokeless tobacco: Never Used  Substance Use Topics  . Alcohol use: Yes    Comment: Rarely   family history includes Alcohol abuse in his father and maternal uncle; Alzheimer's disease in his maternal grandmother; Arthritis in his mother; Cancer in his paternal aunt; Cancer (age of onset: 60) in his father; Diabetes in his brother; Heart disease  in his paternal grandmother; Hyperlipidemia in his mother; Hypertension in his brother and mother; Kidney disease in his brother.  Medications: Current Outpatient Medications  Medication Sig Dispense Refill  . AMBULATORY NON FORMULARY MEDICATION Bayer test strips and lancets. Use to check blood sugar twice a day. Dx: Type Two Diabetes Mellitus 100 Units 11  . atorvastatin (LIPITOR) 40 MG tablet Take 1 tablet (40 mg total) by mouth daily. 90 tablet 1  . Blood Glucose Monitoring Suppl (BAYER CONTOUR MONITOR) w/Device KIT Use to check blood sugar twice a day. Dx: Type Two Diabetes Mellitus 1 kit 0  . Continuous Blood Gluc Sensor (FREESTYLE LIBRE SENSOR SYSTEM) MISC Use as directed. Change device every 2 weeks 1 each 12  . CONTOUR NEXT TEST test strip CHECK BLOOD SUGAR 1 OR 2 TIMES DAILY AS NEEDED 100 each 0  . Dulaglutide (TRULICITY) 7.25 DG/6.4QI SOPN Inject 0.5 mLs into the skin once a week. 4 pen 11  . Insulin Glargine (TOUJEO SOLOSTAR) 300 UNIT/ML SOPN Inject 20 Units into the skin daily. 4 mL 6  . Insulin Pen Needle (PEN NEEDLES) 31G X 8 MM MISC Use the pen needles for both the insulin and victoza pens daily 100 each 12  . INVOKAMET [phone] MG TABS TAKE 1 TABLET BY MOUTH TWICE DAILY 180 tablet 0  . Lancet Device MISC Check blood sugar once or twice daily as needed. 100 each prn  . lisinopril (PRINIVIL,ZESTRIL) 20 MG tablet TAKE 1 TABLET(20 MG) BY MOUTH EVERY MORNING 90 tablet 0  . pioglitazone (ACTOS) 45 MG tablet TAKE 1 TABLET BY MOUTH EVERY DAY 90 tablet 0  . VENTOLIN HFA 108 (90 Base) MCG/ACT inhaler INHALE 2 PUFFS INTO THE LUNGS EVERY 6 HOURS AS NEEDED FOR WHEEZING OR SHORTNESS OF BREATH 18 g 0  . hydrochlorothiazide (HYDRODIURIL) 25 MG tablet Take 1 tablet (25 mg total) by mouth daily. 90 tablet 1   No current facility-administered medications for this visit.    No Known Allergies   Discussed warning signs or symptoms. Please see discharge instructions. Patient expresses  understanding.

## 2018-11-26 NOTE — Patient Instructions (Addendum)
Thank you for coming in today. Get lab after Jan 11th.   Get eye exam.   You should hear about the colon cancer screening appointment at [facility].  Cancel the [place] one.   Continue lisinopril for blood pressure.  Add HCTZ for blood pressure in the morning.   Recheck in 3 months.   Continue health diet.   Let me know if have any issues.

## 2018-12-06 ENCOUNTER — Encounter: Payer: Self-pay | Admitting: Family Medicine

## 2018-12-06 ENCOUNTER — Encounter: Payer: BLUE CROSS/BLUE SHIELD | Admitting: Gastroenterology

## 2018-12-08 ENCOUNTER — Other Ambulatory Visit: Payer: Self-pay | Admitting: Family Medicine

## 2018-12-08 DIAGNOSIS — E119 Type 2 diabetes mellitus without complications: Secondary | ICD-10-CM

## 2018-12-19 ENCOUNTER — Other Ambulatory Visit: Payer: Self-pay | Admitting: Family Medicine

## 2018-12-19 DIAGNOSIS — I10 Essential (primary) hypertension: Secondary | ICD-10-CM

## 2018-12-19 DIAGNOSIS — E119 Type 2 diabetes mellitus without complications: Secondary | ICD-10-CM

## 2018-12-20 DIAGNOSIS — H25813 Combined forms of age-related cataract, bilateral: Secondary | ICD-10-CM | POA: Diagnosis not present

## 2018-12-20 DIAGNOSIS — E113313 Type 2 diabetes mellitus with moderate nonproliferative diabetic retinopathy with macular edema, bilateral: Secondary | ICD-10-CM | POA: Diagnosis not present

## 2018-12-20 DIAGNOSIS — H02834 Dermatochalasis of left upper eyelid: Secondary | ICD-10-CM | POA: Diagnosis not present

## 2018-12-20 DIAGNOSIS — H02831 Dermatochalasis of right upper eyelid: Secondary | ICD-10-CM | POA: Diagnosis not present

## 2018-12-20 LAB — HM DIABETES EYE EXAM

## 2018-12-24 ENCOUNTER — Telehealth: Payer: Self-pay | Admitting: Family Medicine

## 2018-12-24 DIAGNOSIS — E113553 Type 2 diabetes mellitus with stable proliferative diabetic retinopathy, bilateral: Secondary | ICD-10-CM

## 2018-12-24 DIAGNOSIS — Z794 Long term (current) use of insulin: Secondary | ICD-10-CM

## 2018-12-24 NOTE — Telephone Encounter (Signed)
Received documentation from [facility] eye surgeons regarding diabetic eye exam.  Date of service December 20, 2018.  Patient has moderate nonproliferative retinopathy with macular edema. Right eye few intraretinal hemorrhages and microaneurysms.  Few hard exudates. Left eye similar. Result will be sent to abstract.

## 2018-12-26 ENCOUNTER — Encounter: Payer: Self-pay | Admitting: Family Medicine

## 2018-12-27 ENCOUNTER — Encounter: Payer: Self-pay | Admitting: Family Medicine

## 2018-12-27 MED ORDER — AMLODIPINE BESYLATE 10 MG PO TABS: 10.0000 mg | ORAL_TABLET | Freq: Every day | ORAL | 1 refills | Status: DC

## 2019-01-17 ENCOUNTER — Encounter: Payer: BLUE CROSS/BLUE SHIELD | Admitting: Gastroenterology

## 2019-01-24 DIAGNOSIS — K621 Rectal polyp: Secondary | ICD-10-CM | POA: Diagnosis not present

## 2019-01-24 DIAGNOSIS — K640 First degree hemorrhoids: Secondary | ICD-10-CM | POA: Diagnosis not present

## 2019-01-24 DIAGNOSIS — Z1211 Encounter for screening for malignant neoplasm of colon: Secondary | ICD-10-CM | POA: Diagnosis not present

## 2019-01-24 LAB — HM COLONOSCOPY

## 2019-01-26 ENCOUNTER — Other Ambulatory Visit: Payer: Self-pay | Admitting: Family Medicine

## 2019-01-26 DIAGNOSIS — I10 Essential (primary) hypertension: Secondary | ICD-10-CM

## 2019-02-02 ENCOUNTER — Other Ambulatory Visit: Payer: Self-pay | Admitting: Family Medicine

## 2019-02-02 DIAGNOSIS — E119 Type 2 diabetes mellitus without complications: Secondary | ICD-10-CM

## 2019-02-04 ENCOUNTER — Telehealth: Payer: Self-pay | Admitting: Family Medicine

## 2019-02-04 ENCOUNTER — Encounter: Payer: Self-pay | Admitting: Family Medicine

## 2019-02-04 DIAGNOSIS — K635 Polyp of colon: Secondary | ICD-10-CM

## 2019-02-04 HISTORY — DX: Polyp of colon: K63.5

## 2019-02-04 NOTE — Telephone Encounter (Signed)
Received colonoscopy report.  Date of service January 24, 2019 single sessile polyp present.

## 2019-02-26 ENCOUNTER — Encounter: Payer: Self-pay | Admitting: Family Medicine

## 2019-02-27 ENCOUNTER — Encounter: Payer: Self-pay | Admitting: Family Medicine

## 2019-02-27 DIAGNOSIS — Z794 Long term (current) use of insulin: Secondary | ICD-10-CM

## 2019-02-27 DIAGNOSIS — E113553 Type 2 diabetes mellitus with stable proliferative diabetic retinopathy, bilateral: Secondary | ICD-10-CM

## 2019-02-27 MED ORDER — ONETOUCH ULTRA 2 W/DEVICE KIT: PACK | 0 refills | Status: AC

## 2019-02-28 ENCOUNTER — Encounter: Payer: Self-pay | Admitting: Family Medicine

## 2019-02-28 DIAGNOSIS — Z794 Long term (current) use of insulin: Secondary | ICD-10-CM

## 2019-02-28 DIAGNOSIS — E113553 Type 2 diabetes mellitus with stable proliferative diabetic retinopathy, bilateral: Secondary | ICD-10-CM

## 2019-02-28 MED ORDER — GLUCOSE BLOOD VI STRP: ORAL_STRIP | 12 refills | Status: DC

## 2019-03-03 MED ORDER — GLUCOSE BLOOD VI STRP: ORAL_STRIP | 12 refills | Status: DC

## 2019-03-05 ENCOUNTER — Telehealth: Payer: Self-pay | Admitting: Family Medicine

## 2019-03-05 ENCOUNTER — Encounter: Payer: Self-pay | Admitting: Family Medicine

## 2019-03-05 ENCOUNTER — Ambulatory Visit (INDEPENDENT_AMBULATORY_CARE_PROVIDER_SITE_OTHER): Payer: BLUE CROSS/BLUE SHIELD | Admitting: Family Medicine

## 2019-03-05 VITALS — BP 132/80

## 2019-03-05 DIAGNOSIS — E1159 Type 2 diabetes mellitus with other circulatory complications: Secondary | ICD-10-CM

## 2019-03-05 DIAGNOSIS — Z794 Long term (current) use of insulin: Secondary | ICD-10-CM

## 2019-03-05 DIAGNOSIS — E782 Mixed hyperlipidemia: Secondary | ICD-10-CM | POA: Diagnosis not present

## 2019-03-05 DIAGNOSIS — E113553 Type 2 diabetes mellitus with stable proliferative diabetic retinopathy, bilateral: Secondary | ICD-10-CM | POA: Diagnosis not present

## 2019-03-05 DIAGNOSIS — I152 Hypertension secondary to endocrine disorders: Secondary | ICD-10-CM

## 2019-03-05 DIAGNOSIS — I10 Essential (primary) hypertension: Secondary | ICD-10-CM

## 2019-03-05 DIAGNOSIS — E1121 Type 2 diabetes mellitus with diabetic nephropathy: Secondary | ICD-10-CM

## 2019-03-05 NOTE — Patient Instructions (Addendum)
Thank you for coming in today. Continue current medicines.  Get labs soon.  Recheck in 3 months.   Return sooner if needed.   Keep working with diet and exercise.

## 2019-03-05 NOTE — Telephone Encounter (Signed)
-----   Message from Rodolph Bong, MD sent at 03/05/2019  8:43 AM EDT ----- Regarding: Follow up in 3 months Please schedule diabetes follow up for 3 months

## 2019-03-05 NOTE — Telephone Encounter (Signed)
Left voicemail to call us back to make an appointment.

## 2019-03-05 NOTE — Progress Notes (Addendum)
Virtual Visit  via Video Note  I connected with      Edward Riley on 02/14/19 at 825 by a video enabled telemedicine application and verified that I am speaking with the correct person using two identifiers.   I discussed the limitations of evaluation and management by telemedicine and the availability of in person appointments. The patient expressed understanding and agreed to proceed.  History of Present Illness: Edward Riley is a 53 y.o. male who would like to discuss hypertension diabetes, and hyperlipidemia.   Diabetes: A1c last checked on October 11 and was 8.7.  Patient had A1c ordered after last visit however it was never done.  He takes Trulicity, invoke a met, Actos, and Toujeo insulin 20 units daily.  He notes that his sugars have been typically less than 180. He notes that he had not been eating normally. He notes that he restarted the [facility] Sunday and his sugars improved.  He is unable to exercise normally took over 19 but is getting back on a healthier lifestyle.  Hypertension:Edward Riley is currently taking amlodipine 10, hydrochlorothiazide 25, lisinopril 20.  He notes that 132/82. 124/90  Hyperlipidemia: Currently managed with pravastatin 80.  He has a prescription on his list for both atorvastatin and pravastatin however the pravastatin is new or anything she is taking the pravastatin and will double check when he gets home later today.  He tolerates the medication well with no issues.  At work about 2 months ago he fell off of a piece of machinery landing on his upper back.  He was seen in occupational health clinic and has been slowly improving.  He still has some soreness and pain in his upper thoracic back but overall is a lot better.  He is happy with how things are going and is back to work with full duties.    Observations/Objective: BP 132/80  Wt Readings from Last 5 Encounters:  11/26/18 251 lb (113.9 kg)  10/28/18 264 lb (119.7 kg)  09/06/18 257 lb  (116.6 kg)  03/08/18 267 lb (121.1 kg)  11/29/17 267 lb (121.1 kg)   Exam: Appearance nontoxic-appearing no acute distress Normal Speech.  No shortness of breath.  No facial swelling.  No nasal discharge.  No hoarseness of voice wheeze or stridor. Psych alert and oriented normal speech thought process and affect.    Assessment and Plan: 53 y.o. male with  Diabetes: Not ideally controlled.  Currently blood sugars seem to be a bit better controlled however A1c is overdue.  I expect that when he does get his A1c it will not be at goal because he had not been taking his insulin until recently.  Plan to continue healthy lifestyle and medications and recheck in 3 months.  Hypertension: Blood pressure much better controlled with medications as above.  Continue current regimen and check metabolic panel.  Hyperlipidemia: We will double check which medicine he is taking but it seems to be pravastatin.  Plan to check fasting labs here in the near future.  Addendum: Patient contacted me on April 9 noting that the medicine he is taking his atorvastatin.  Fall and injury.  Doing much better.  Patient remains low risk for fall as he had a mechanical fall but has not had any falls recently.  If not improving with back pain may be reasonable to proceed with physical therapy.  PDMP not reviewed this encounter. Orders Placed This Encounter  Procedures  . CBC  . COMPLETE METABOLIC PANEL WITH GFR  .  Lipid Panel w/reflex Direct LDL  . Hemoglobin A1c   No orders of the defined types were placed in this encounter.   Follow Up Instructions:    I discussed the assessment and treatment plan with the patient. The patient was provided an opportunity to ask questions and all were answered. The patient agreed with the plan and demonstrated an understanding of the instructions.   The patient was advised to call back or seek an in-person evaluation if the symptoms worsen or if the condition fails to improve  as anticipated.  I provided 25 minutes of non-face-to-face time during this encounter.    Historical information moved to improve visibility of documentation.  Past Medical History:  Diagnosis Date  . Diabetes mellitus without complication ([place])   . Hyperlipidemia   . Hypertension   . Obese   . Retinal hemorrhage, both eyes   . Sessile colonic polyp 02/04/2019   Colonoscopy February 2020.   No past surgical history on file. Social History   Tobacco Use  . Smoking status: Never Smoker  . Smokeless tobacco: Never Used  Substance Use Topics  . Alcohol use: Yes    Comment: Rarely   family history includes Alcohol abuse in his father and maternal uncle; Alzheimer's disease in his maternal grandmother; Arthritis in his mother; Cancer in his paternal aunt; Cancer (age of onset: 65) in his father; Diabetes in his brother; Heart disease in his paternal grandmother; Hyperlipidemia in his mother; Hypertension in his brother and mother; Kidney disease in his brother.  Medications: Current Outpatient Medications  Medication Sig Dispense Refill  . AMBULATORY NON FORMULARY MEDICATION Bayer test strips and lancets. Use to check blood sugar twice a day. Dx: Type Two Diabetes Mellitus 100 Units 11  . amLODipine (NORVASC) 10 MG tablet Take 1 tablet (10 mg total) by mouth daily. 90 tablet 1  . Blood Glucose Monitoring Suppl (ONE TOUCH ULTRA 2) w/Device KIT Use to check blood sugar twice a day. Send strips and lancets, quantity 100 refills 3.  Dx: Type Two Diabetes Mellitus 1 each 0  . Continuous Blood Gluc Sensor (FREESTYLE LIBRE SENSOR SYSTEM) MISC Use as directed. Change device every 2 weeks 1 each 12  . Dulaglutide (TRULICITY) 5.95 GL/8.7FI SOPN Inject 0.5 mLs into the skin once a week. 4 pen 11  . glucose blood (ONE TOUCH ULTRA TEST) test strip Use as instructed 100 each 12  . hydrochlorothiazide (HYDRODIURIL) 25 MG tablet Take 1 tablet (25 mg total) by mouth daily. 90 tablet 1  . Insulin Pen  Needle (PEN NEEDLES) 31G X 8 MM MISC Use the pen needles for both the insulin and victoza pens daily 100 each 12  . INVOKAMET [phone] MG TABS TAKE 1 TABLET BY MOUTH TWICE DAILY 180 tablet 0  . Lancet Device MISC Check blood sugar once or twice daily as needed. 100 each prn  . lisinopril (PRINIVIL,ZESTRIL) 20 MG tablet TAKE 1 TABLET(20 MG) BY MOUTH EVERY MORNING 90 tablet 0  . pioglitazone (ACTOS) 45 MG tablet TAKE 1 TABLET BY MOUTH EVERY DAY 90 tablet 0  . pravastatin (PRAVACHOL) 80 MG tablet TAKE 1 TABLET(80 MG) BY MOUTH DAILY 90 tablet 3  . TOUJEO SOLOSTAR 300 UNIT/ML SOPN INJECT 20 UNITS UNDER THE SKIN EVERY DAY 4.5 mL 0  . VENTOLIN HFA 108 (90 Base) MCG/ACT inhaler INHALE 2 PUFFS INTO THE LUNGS EVERY 6 HOURS AS NEEDED FOR WHEEZING OR SHORTNESS OF BREATH 18 g 0   No current facility-administered medications for this visit.  No Known Allergies

## 2019-03-06 ENCOUNTER — Encounter: Payer: Self-pay | Admitting: Family Medicine

## 2019-03-06 MED ORDER — ATORVASTATIN CALCIUM 40 MG PO TABS: 40.0000 mg | ORAL_TABLET | Freq: Every day | ORAL | 3 refills | Status: DC

## 2019-03-17 ENCOUNTER — Other Ambulatory Visit: Payer: Self-pay | Admitting: Family Medicine

## 2019-03-17 DIAGNOSIS — E119 Type 2 diabetes mellitus without complications: Secondary | ICD-10-CM

## 2019-03-17 DIAGNOSIS — I10 Essential (primary) hypertension: Secondary | ICD-10-CM

## 2019-03-24 ENCOUNTER — Ambulatory Visit: Payer: BLUE CROSS/BLUE SHIELD | Admitting: Family Medicine

## 2019-04-01 ENCOUNTER — Other Ambulatory Visit: Payer: Self-pay | Admitting: Family Medicine

## 2019-04-01 DIAGNOSIS — E119 Type 2 diabetes mellitus without complications: Secondary | ICD-10-CM

## 2019-04-03 ENCOUNTER — Encounter: Payer: Self-pay | Admitting: Family Medicine

## 2019-04-19 ENCOUNTER — Other Ambulatory Visit: Payer: Self-pay | Admitting: Family Medicine

## 2019-05-03 ENCOUNTER — Other Ambulatory Visit: Payer: Self-pay | Admitting: Family Medicine

## 2019-05-03 DIAGNOSIS — I10 Essential (primary) hypertension: Secondary | ICD-10-CM

## 2019-05-26 ENCOUNTER — Other Ambulatory Visit: Payer: Self-pay | Admitting: Family Medicine

## 2019-06-19 ENCOUNTER — Other Ambulatory Visit: Payer: Self-pay | Admitting: Family Medicine

## 2019-06-19 DIAGNOSIS — E119 Type 2 diabetes mellitus without complications: Secondary | ICD-10-CM

## 2019-08-19 ENCOUNTER — Other Ambulatory Visit: Payer: Self-pay | Admitting: Family Medicine

## 2019-09-10 ENCOUNTER — Ambulatory Visit (INDEPENDENT_AMBULATORY_CARE_PROVIDER_SITE_OTHER): Payer: BC Managed Care – PPO | Admitting: Family Medicine

## 2019-09-10 ENCOUNTER — Other Ambulatory Visit: Payer: Self-pay

## 2019-09-10 ENCOUNTER — Encounter: Payer: Self-pay | Admitting: Family Medicine

## 2019-09-10 VITALS — Temp 98.2°F

## 2019-09-10 DIAGNOSIS — R05 Cough: Secondary | ICD-10-CM

## 2019-09-10 DIAGNOSIS — R059 Cough, unspecified: Secondary | ICD-10-CM

## 2019-09-10 MED ORDER — BENZONATATE 200 MG PO CAPS: 200.0000 mg | ORAL_CAPSULE | Freq: Three times a day (TID) | ORAL | 0 refills | Status: DC | PRN

## 2019-09-10 NOTE — Progress Notes (Signed)
Virtual Visit  via Video Note  I connected with      Edward Riley by a video enabled telemedicine application and verified that I am speaking with the correct person using two identifiers.   I discussed the limitations of evaluation and management by telemedicine and the availability of in person appointments. The patient expressed understanding and agreed to proceed.  History of Present Illness: Edward Riley is a 53 y.o. male who would like to discuss cough.    Dry cough present 2 weeks. Had a cold 2 weeks ago that lasted 3 days. Now just continued cough. Was productive initially now dry. Tried dayquil and cough drops which help a little.  He denies any fevers or chills or wheezing or shortness of breath.  He has a follow-up visit scheduled with me at the end of the month.  He works as a Production designer, theatre/television/film.    Observations/Objective: Temp 98.2 F (36.8 C)  Wt Readings from Last 5 Encounters:  11/26/18 251 lb (113.9 kg)  10/28/18 264 lb (119.7 kg)  09/06/18 257 lb (116.6 kg)  03/08/18 267 lb (121.1 kg)  11/29/17 267 lb (121.1 kg)   Exam: Appearance nontoxic Normal Speech.  No tachypnea or wheezing.  Able to complete sentences.  Alert and oriented.  Lab and Radiology Results No results found for this or any previous visit (from the past 72 hour(s)). No results found.   Assessment and Plan: 53 y.o. male with cough.  Likely postviral.  Patient did not have a COVID test but he has effectively at this point completed his CDC recommended self quarantine so I do not see the utility in getting a PCR COVID test at this point.  Plan to proceed with symptomatic management for post viral cough with continued over-the-counter medications.  Recommend Mucinex and Robitussin.  Will use Tessalon Perles as well.  If not sufficient would consider albuterol inhaler.  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  Recheck in person as scheduled late October.  Patient will need follow-up for his diabetes etc.  PDMP not reviewed this encounter. No orders of the defined types were placed in this encounter.  Meds ordered this encounter  Medications  . benzonatate (TESSALON) 200 MG capsule    Sig: Take 1 capsule (200 mg total) by mouth 3 (three) times daily as needed for cough.    Dispense:  45 capsule    Refill:  0    Follow Up Instructions:    I discussed the assessment and treatment plan with the patient. The patient was provided an opportunity to ask questions and all were answered. The patient agreed with the plan and demonstrated an understanding of the instructions.   The patient was advised to call back or seek an in-person evaluation if the symptoms worsen or if the condition fails to improve as anticipated.  Time: 15 minutes of intraservice time, with >22 minutes of total time during today's visit.      Historical information moved to improve visibility of documentation.  Past Medical History:  Diagnosis Date  . Diabetes mellitus without complication ([place])   . Hyperlipidemia   . Hypertension   . Obese   . Retinal hemorrhage, both eyes   . Sessile colonic polyp 02/04/2019   Colonoscopy February 2020.   No past surgical history on file. Social History   Tobacco Use  .  Smoking status: Never Smoker  . Smokeless tobacco: Never Used  Substance Use Topics  . Alcohol use: Yes    Comment: Rarely   family history includes Alcohol abuse in his father and maternal uncle; Alzheimer's disease in his maternal grandmother; Arthritis in his mother; Cancer in his paternal aunt; Cancer (age of onset: 47) in his father; Diabetes in his brother; Heart disease in his paternal grandmother; Hyperlipidemia in his mother; Hypertension in his brother and mother; Kidney disease in his brother.  Medications: Current Outpatient Medications  Medication Sig  Dispense Refill  . AMBULATORY NON FORMULARY MEDICATION Bayer test strips and lancets. Use to check blood sugar twice a day. Dx: Type Two Diabetes Mellitus 100 Units 11  . amLODipine (NORVASC) 10 MG tablet TAKE 1 TABLET BY MOUTH ONCE DAILY 90 tablet 1  . atorvastatin (LIPITOR) 40 MG tablet TAKE 1 TABLET(40 MG) BY MOUTH DAILY 90 tablet 3  . BD PEN NEEDLE NANO U/F 32G X 4 MM MISC USE AS DIRECTED 200 each 2  . benzonatate (TESSALON) 200 MG capsule Take 1 capsule (200 mg total) by mouth 3 (three) times daily as needed for cough. 45 capsule 0  . Blood Glucose Monitoring Suppl (ONE TOUCH ULTRA 2) w/Device KIT Use to check blood sugar twice a day. Send strips and lancets, quantity 100 refills 3.  Dx: Type Two Diabetes Mellitus 1 each 0  . Continuous Blood Gluc Sensor (FREESTYLE LIBRE SENSOR SYSTEM) MISC Use as directed. Change device every 2 weeks 1 each 12  . Dulaglutide (TRULICITY) 8.32 PQ/9.8YM SOPN Inject 0.5 mLs into the skin once a week. 4 pen 11  . glucose blood (ONE TOUCH ULTRA TEST) test strip Use as instructed 100 each 12  . hydrochlorothiazide (HYDRODIURIL) 25 MG tablet TAKE 1 TABLET BY MOUTH DAILY 90 tablet 1  . Insulin Pen Needle (PEN NEEDLES) 31G X 8 MM MISC Use the pen needles for both the insulin and victoza pens daily 100 each 12  . INVOKAMET [phone] MG TABS TAKE 1 TABLET BY MOUTH TWICE DAILY. 180 tablet 0  . Lancet Device MISC Check blood sugar once or twice daily as needed. 100 each prn  . lisinopril (ZESTRIL) 10 MG tablet TAKE 1 TABLET BY MOUTH EVERY MORNING 90 tablet 0  . lisinopril (ZESTRIL) 20 MG tablet TAKE 1 TABLET BY MOUTH EVERY MORNING 90 tablet 1  . pioglitazone (ACTOS) 45 MG tablet TAKE 1 TABLET BY MOUTH EVERY DAY. 90 tablet 0  . tadalafil (CIALIS) 20 MG tablet TAKE 1 TABLET BY MOUTH DAILY AS NEEDED FOR ERECTILE DYSFUNCTION 10 tablet 0  . TOUJEO SOLOSTAR 300 UNIT/ML SOPN INJECT 20 UNITS UNDER THE SKIN EVERY DAY 4.5 mL 2  . VENTOLIN HFA 108 (90 Base) MCG/ACT inhaler INHALE 2  PUFFS INTO THE LUNGS EVERY 6 HOURS AS NEEDED FOR WHEEZING OR SHORTNESS OF BREATH 18 g 0   No current facility-administered medications for this visit.    No Known Allergies

## 2019-09-19 ENCOUNTER — Other Ambulatory Visit: Payer: Self-pay

## 2019-09-19 ENCOUNTER — Encounter: Payer: Self-pay | Admitting: Family Medicine

## 2019-09-19 ENCOUNTER — Ambulatory Visit: Payer: BC Managed Care – PPO | Admitting: Family Medicine

## 2019-09-19 VITALS — BP 126/72 | HR 78 | Temp 98.2°F | Wt 245.0 lb

## 2019-09-19 DIAGNOSIS — I10 Essential (primary) hypertension: Secondary | ICD-10-CM

## 2019-09-19 DIAGNOSIS — E119 Type 2 diabetes mellitus without complications: Secondary | ICD-10-CM

## 2019-09-19 DIAGNOSIS — Z125 Encounter for screening for malignant neoplasm of prostate: Secondary | ICD-10-CM | POA: Diagnosis not present

## 2019-09-19 DIAGNOSIS — E113553 Type 2 diabetes mellitus with stable proliferative diabetic retinopathy, bilateral: Secondary | ICD-10-CM | POA: Diagnosis not present

## 2019-09-19 DIAGNOSIS — E782 Mixed hyperlipidemia: Secondary | ICD-10-CM

## 2019-09-19 DIAGNOSIS — Z794 Long term (current) use of insulin: Secondary | ICD-10-CM | POA: Diagnosis not present

## 2019-09-19 LAB — POCT GLYCOSYLATED HEMOGLOBIN (HGB A1C): Hemoglobin A1C: 8.5 % — AB (ref 4.0–5.6)

## 2019-09-19 MED ORDER — TRULICITY 0.75 MG/0.5ML ~~LOC~~ SOAJ: 0.5000 mL | SUBCUTANEOUS | 11 refills | Status: DC

## 2019-09-19 MED ORDER — LISINOPRIL 20 MG PO TABS: 20.0000 mg | ORAL_TABLET | Freq: Every morning | ORAL | 1 refills | Status: DC

## 2019-09-19 MED ORDER — AMLODIPINE BESYLATE 10 MG PO TABS: 10.0000 mg | ORAL_TABLET | Freq: Every day | ORAL | 1 refills | Status: DC

## 2019-09-19 MED ORDER — INVOKAMET 150-1000 MG PO TABS: 1.0000 | ORAL_TABLET | Freq: Two times a day (BID) | ORAL | 3 refills | Status: DC

## 2019-09-19 MED ORDER — ROSUVASTATIN CALCIUM 20 MG PO TABS: 20.0000 mg | ORAL_TABLET | Freq: Every day | ORAL | 3 refills | Status: DC

## 2019-09-19 MED ORDER — PIOGLITAZONE HCL 45 MG PO TABS: 45.0000 mg | ORAL_TABLET | Freq: Every day | ORAL | 3 refills | Status: DC

## 2019-09-19 MED ORDER — HYDROCHLOROTHIAZIDE 25 MG PO TABS: 25.0000 mg | ORAL_TABLET | Freq: Every day | ORAL | 1 refills | Status: DC

## 2019-09-19 NOTE — Progress Notes (Signed)
Edward Riley is a 53 y.o. male who presents to [facility] today for follow-up diabetes hypertension hyperlipidemia obesity.  Diabetes: Blood sugars are typically 160s to 200s.  Patient is currently using Invokana/Metformin, Actos, Toujeo insulin 20 units daily. He does take Trulicity occasionally. He notes that it does tend to cause nausea. He uses it every month or so. He notes that during the winter he tolerates a bit better and would like to continue to try to use it.   Hypertension: Managed with lisinopril 20, amlodipine 10, hydrochlorothiazide 25.  Patient tolerates medication well with no chest pain palpitation shortness of breath lightheadedness or dizziness.  Hyperlipidemia: Previously taking atorvastatin.  He was taking it very infrequently because it was causing him to have muscle pain.  He has not tried other statins.  Obesity .[name] notes that his diet has been not ideal over the last few months.  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  He already has cut out sugar sweetened beverages and is trying to reduce his carbs further.  He has lost some weight intentionally.    ROS as above:  Exam:  BP 126/72   Pulse 78   Temp 98.2 F (36.8 C) (Oral)   Wt 245 lb (111.1 kg)   BMI 36.18 kg/m  Wt Readings from Last 5 Encounters:  09/19/19 245 lb (111.1 kg)  11/26/18 251 lb (113.9 kg)  10/28/18 264 lb (119.7 kg)  09/06/18 257 lb (116.6 kg)  03/08/18 267 lb (121.1 kg)    Gen: Well NAD HEENT: EOMI,  MMM Lungs: Normal work of breathing. CTABL Heart: RRR no MRG Abd: NABS, Soft. Nondistended, Nontender Exts: Brisk capillary refill, warm and well perfused.   Lab and Radiology Results Results for orders placed or performed in visit on 09/19/19 (from the past 72  hour(s))  POCT HgB A1C     Status: Abnormal   Collection Time: 09/19/19 10:14 AM  Result Value Ref Range   Hemoglobin A1C 8.5 (A) 4.0 - 5.6 %   HbA1c POC (<> result, manual entry)     HbA1c, POC (prediabetic range)     HbA1c, POC (controlled diabetic range)     No results found.    Assessment and Plan: 53 y.o. male with  Diabetes: Not ideally controlled.  Plan to try to be a bit more compliant with Trulicity.  If he can does not tolerate would recommend switching to Januvia or equivalent.  Continue to work on [employer].  Also continue to titrate Toujeo.  Recheck in about 4 months with new PCP Dr. Luetta Nutting.  Hypertension: Blood pressure controlled continue current regimen.  Hyperlipidemia: Intolerant of atorvastatin.  Able to only take very infrequently.  We will get basic fasting labs today but will go ahead and switch to Crestor which she should be able to tolerate a little bit better.  If not would consider pravastatin or even Livalo.  Recommend continued low-carb diet.  Recommend exercise  We will check PSA as well today.  PDMP not reviewed this encounter. Orders Placed This Encounter  Procedures  . CBC  . COMPLETE METABOLIC PANEL WITH GFR  . Lipid Panel w/reflex Direct LDL  . PSA  . POCT HgB A1C   Meds ordered this encounter  Medications  . rosuvastatin (CRESTOR) 20 MG tablet    Sig:  Take 1 tablet (20 mg total) by mouth daily.    Dispense:  90 tablet    Refill:  3  . lisinopril (ZESTRIL) 20 MG tablet    Sig: Take 1 tablet (20 mg total) by mouth every morning.    Dispense:  90 tablet    Refill:  1  . amLODipine (NORVASC) 10 MG tablet    Sig: Take 1 tablet (10 mg total) by mouth daily.    Dispense:  90 tablet    Refill:  1  . hydrochlorothiazide (HYDRODIURIL) 25 MG tablet    Sig: Take 1 tablet (25 mg total) by mouth daily.    Dispense:  90 tablet    Refill:  1  . Dulaglutide (TRULICITY) 6.29 BM/8.4XL SOPN    Sig: Inject 0.5 mLs into the skin once a  week.    Dispense:  4 pen    Refill:  11    Patient will have discount coupon  . pioglitazone (ACTOS) 45 MG tablet    Sig: Take 1 tablet (45 mg total) by mouth daily.    Dispense:  90 tablet    Refill:  3  . Canagliflozin-metFORMIN HCl (INVOKAMET) [phone] MG TABS    Sig: Take 1 tablet by mouth 2 (two) times daily.    Dispense:  180 tablet    Refill:  3     Historical information moved to improve visibility of documentation.  Past Medical History:  Diagnosis Date  . Diabetes mellitus without complication ([place])   . Hyperlipidemia   . Hypertension   . Obese   . Retinal hemorrhage, both eyes   . Sessile colonic polyp 02/04/2019   Colonoscopy February 2020.   No past surgical history on file. Social History   Tobacco Use  . Smoking status: Never Smoker  . Smokeless tobacco: Never Used  Substance Use Topics  . Alcohol use: Yes    Comment: Rarely   family history includes Alcohol abuse in his father and maternal uncle; Alzheimer's disease in his maternal grandmother; Arthritis in his mother; Cancer in his paternal aunt; Cancer (age of onset: 51) in his father; Diabetes in his brother; Heart disease in his paternal grandmother; Hyperlipidemia in his mother; Hypertension in his brother and mother; Kidney disease in his brother.  Medications: Current Outpatient Medications  Medication Sig Dispense Refill  . AMBULATORY NON FORMULARY MEDICATION Bayer test strips and lancets. Use to check blood sugar twice a day. Dx: Type Two Diabetes Mellitus 100 Units 11  . amLODipine (NORVASC) 10 MG tablet Take 1 tablet (10 mg total) by mouth daily. 90 tablet 1  . BD PEN NEEDLE NANO U/F 32G X 4 MM MISC USE AS DIRECTED 200 each 2  . Blood Glucose Monitoring Suppl (ONE TOUCH ULTRA 2) w/Device KIT Use to check blood sugar twice a day. Send strips and lancets, quantity 100 refills 3.  Dx: Type Two Diabetes Mellitus 1 each 0  . Canagliflozin-metFORMIN HCl (INVOKAMET) [phone] MG TABS Take 1 tablet by  mouth 2 (two) times daily. 180 tablet 3  . Continuous Blood Gluc Sensor (FREESTYLE LIBRE SENSOR SYSTEM) MISC Use as directed. Change device every 2 weeks 1 each 12  . Dulaglutide (TRULICITY) 2.44 WN/0.2VO SOPN Inject 0.5 mLs into the skin once a week. 4 pen 11  . glucose blood (ONE TOUCH ULTRA TEST) test strip Use as instructed 100 each 12  . hydrochlorothiazide (HYDRODIURIL) 25 MG tablet Take 1 tablet (25 mg total) by mouth daily. 90 tablet 1  . Insulin  Pen Needle (PEN NEEDLES) 31G X 8 MM MISC Use the pen needles for both the insulin and victoza pens daily 100 each 12  . Lancet Device MISC Check blood sugar once or twice daily as needed. 100 each prn  . lisinopril (ZESTRIL) 20 MG tablet Take 1 tablet (20 mg total) by mouth every morning. 90 tablet 1  . pioglitazone (ACTOS) 45 MG tablet Take 1 tablet (45 mg total) by mouth daily. 90 tablet 3  . tadalafil (CIALIS) 20 MG tablet TAKE 1 TABLET BY MOUTH DAILY AS NEEDED FOR ERECTILE DYSFUNCTION 10 tablet 0  . TOUJEO SOLOSTAR 300 UNIT/ML SOPN INJECT 20 UNITS UNDER THE SKIN EVERY DAY 4.5 mL 2  . rosuvastatin (CRESTOR) 20 MG tablet Take 1 tablet (20 mg total) by mouth daily. 90 tablet 3   No current facility-administered medications for this visit.    No Known Allergies   Discussed warning signs or symptoms. Please see discharge instructions. Patient expresses understanding.

## 2019-09-19 NOTE — Patient Instructions (Addendum)
Thank you for coming in today. Switch from atrovistatin to crestor.  Let me know if you have problems with the new medicine.  Recheck in 4 months with Dr Zigmund Daniel.  Return sooner if needed.   I will be moving to full time Sports Medicine in [place] starting on November 2nd  You will still be able to see me for your Sports Medicine or Orthopedic needs at [facility] in [place]. I will still be part of [facility].    If you want to stay locally for your Sports Medicine issues Dr. Dianah Field here in [place] will be happy to see you.  Additionally Dr. Clearance Coots at [facility] will be happy to see you for sports medicine issues more locally.   For your primary care needs you are welcome to establish care with Dr. Emeterio Reeve.  Dr Luetta Nutting (Starting in February) will be starting in the new year and a new NP Joy (Starting in December).  We are working quickly to hire more physicians to cover the primary care needs however if you cannot get an appointment with Dr. Sheppard Coil in a timely manner [facility] has locations and openings for primary care services nearby.   [facility] at [facility] [address] . [place] , [place]: [phone] . Behavioral Medicine: [phone] . Fax: [place] at [employer] [address] . [place], [place]: [phone] . Behavioral Medicine: [phone] . Fax: [phone] . Hours (M-F): 7am - Academic librarian At [facility]. [place], [place]: [phone] . Behavioral Medicine: [phone] . Fax: [phone] . Hours (M-F): 8am - Optician, dispensing at [employer] . [place], [place] Phone: [phone] . Behavioral Medicine: [phone] . Fax: [phone]

## 2019-09-20 LAB — COMPLETE METABOLIC PANEL WITH GFR
AG Ratio: 1.7 (calc) (ref 1.0–2.5)
ALT: 11 U/L (ref 9–46)
AST: 14 U/L (ref 10–35)
Albumin: 4.6 g/dL (ref 3.6–5.1)
Alkaline phosphatase (APISO): 70 U/L (ref 35–144)
BUN/Creatinine Ratio: 22 (calc) (ref 6–22)
BUN: 29 mg/dL — ABNORMAL HIGH (ref 7–25)
CO2: 26 mmol/L (ref 20–32)
Calcium: 10.3 mg/dL (ref 8.6–10.3)
Chloride: 100 mmol/L (ref 98–110)
Creat: 1.32 mg/dL (ref 0.70–1.33)
GFR, Est African American: 72 mL/min/{1.73_m2} (ref >=60)
GFR, Est Non African American: 62 mL/min/{1.73_m2} (ref >=60)
Globulin: 2.7 g/dL (calc) (ref 1.9–3.7)
Glucose, Bld: 165 mg/dL — ABNORMAL HIGH (ref 65–99)
Potassium: 5.4 mmol/L — ABNORMAL HIGH (ref 3.5–5.3)
Sodium: 137 mmol/L (ref 135–146)
Total Bilirubin: 0.5 mg/dL (ref 0.2–1.2)
Total Protein: 7.3 g/dL (ref 6.1–8.1)

## 2019-09-20 LAB — CBC
HCT: 43.4 % (ref 38.5–50.0)
Hemoglobin: 14.8 g/dL (ref 13.2–17.1)
MCH: 30.7 pg (ref 27.0–33.0)
MCHC: 34.1 g/dL (ref 32.0–36.0)
MCV: 90 fL (ref 80.0–100.0)
MPV: 12.2 fL (ref 7.5–12.5)
Platelets: 239 10*3/uL (ref 140–400)
RBC: 4.82 10*6/uL (ref 4.20–5.80)
RDW: 12.3 % (ref 11.0–15.0)
WBC: 10.2 10*3/uL (ref 3.8–10.8)

## 2019-09-20 LAB — LIPID PANEL W/REFLEX DIRECT LDL
Cholesterol: 273 mg/dL — ABNORMAL HIGH (ref <200)
HDL: 38 mg/dL — ABNORMAL LOW (ref >=40)
LDL Cholesterol (Calc): 193 mg/dL (calc) — ABNORMAL HIGH
Non-HDL Cholesterol (Calc): 235 mg/dL (calc) — ABNORMAL HIGH (ref <130)
Total CHOL/HDL Ratio: 7.2 (calc) — ABNORMAL HIGH (ref <5.0)
Triglycerides: 228 mg/dL — ABNORMAL HIGH (ref <150)

## 2019-09-20 LAB — PSA: PSA: 0.3 ng/mL (ref <=4.0)

## 2019-09-22 ENCOUNTER — Ambulatory Visit: Payer: BLUE CROSS/BLUE SHIELD | Admitting: Family Medicine

## 2019-09-29 ENCOUNTER — Other Ambulatory Visit: Payer: Self-pay | Admitting: Family Medicine

## 2019-09-29 DIAGNOSIS — E119 Type 2 diabetes mellitus without complications: Secondary | ICD-10-CM

## 2019-10-04 ENCOUNTER — Other Ambulatory Visit: Payer: Self-pay | Admitting: Family Medicine

## 2019-11-06 ENCOUNTER — Other Ambulatory Visit: Payer: Self-pay | Admitting: Family Medicine

## 2019-11-06 DIAGNOSIS — I10 Essential (primary) hypertension: Secondary | ICD-10-CM

## 2019-11-27 ENCOUNTER — Other Ambulatory Visit: Payer: Self-pay | Admitting: Family Medicine

## 2019-11-27 MED ORDER — TADALAFIL 20 MG PO TABS: 20.0000 mg | ORAL_TABLET | Freq: Every day | ORAL | 0 refills | Status: DC | PRN

## 2019-11-27 NOTE — Telephone Encounter (Signed)
Can you add a sig to this prescription? Thank you. KG LPN

## 2020-01-02 ENCOUNTER — Other Ambulatory Visit: Payer: Self-pay

## 2020-01-02 ENCOUNTER — Encounter: Payer: Self-pay | Admitting: Family Medicine

## 2020-01-02 ENCOUNTER — Ambulatory Visit: Payer: BC Managed Care – PPO | Admitting: Family Medicine

## 2020-01-02 VITALS — BP 136/68 | HR 86 | Wt 254.0 lb

## 2020-01-02 DIAGNOSIS — I10 Essential (primary) hypertension: Secondary | ICD-10-CM

## 2020-01-02 DIAGNOSIS — E782 Mixed hyperlipidemia: Secondary | ICD-10-CM | POA: Diagnosis not present

## 2020-01-02 DIAGNOSIS — N529 Male erectile dysfunction, unspecified: Secondary | ICD-10-CM

## 2020-01-02 DIAGNOSIS — E113553 Type 2 diabetes mellitus with stable proliferative diabetic retinopathy, bilateral: Secondary | ICD-10-CM | POA: Diagnosis not present

## 2020-01-02 DIAGNOSIS — Z794 Long term (current) use of insulin: Secondary | ICD-10-CM | POA: Diagnosis not present

## 2020-01-02 DIAGNOSIS — E1121 Type 2 diabetes mellitus with diabetic nephropathy: Secondary | ICD-10-CM | POA: Diagnosis not present

## 2020-01-02 DIAGNOSIS — I152 Hypertension secondary to endocrine disorders: Secondary | ICD-10-CM

## 2020-01-02 DIAGNOSIS — E1159 Type 2 diabetes mellitus with other circulatory complications: Secondary | ICD-10-CM | POA: Diagnosis not present

## 2020-01-02 LAB — POCT GLYCOSYLATED HEMOGLOBIN (HGB A1C): Hemoglobin A1C: 8.8 % — AB (ref 4.0–5.6)

## 2020-01-02 NOTE — Assessment & Plan Note (Signed)
Blood pressure is at goal at for age and co-morbidities.  I recommend he continue medications at current dose.  In addition they were instructed to follow a low sodium diet with regular exercise to help to maintain adequate control of blood pressure.   

## 2020-01-02 NOTE — Assessment & Plan Note (Signed)
Tadalafil provides some improvement.  He may want referral to urology in the future to discuss additional options.

## 2020-01-02 NOTE — Patient Instructions (Signed)

## 2020-01-02 NOTE — Assessment & Plan Note (Signed)
Tolerating crestor well, continue at current dose.

## 2020-01-02 NOTE — Assessment & Plan Note (Signed)
Most recent A1c of  Lab Results  Component Value Date   HGBA1C 8.8 (A) 01/02/2020   indicates diabetes is not well controlled.  he  will be sure to take trulicity weekly in addition to other medications as well as work on lifestyle changes.  Counseled on healthy, low carb diet and recommend frequent activity to help with maintaining good control of blood sugars.

## 2020-01-02 NOTE — Progress Notes (Signed)
Edward Riley - 54 y.o. male MRN [id number]  Date of birth: 1966/06/03  Subjective Chief Complaint  Patient presents with  . Follow-up    diabetes    HPI Edward Riley is a 54 y.o. male with history of T2DM, HTN, HLD, and ED here today for follow up  -T2DM: Diabetes has historically been poorly controlled.  He reports compliance with medications but has not been very compliant with diet. He does miss trulicity  He works as a Naval architect and often eats out.  He does stay fairly active.  He is on taking crestor 20mg  as well for associated HLD.    -HTN:  Current treatment with lisinopril, amlodipine and HCTZ.  BP has remained well controlled with current medications.  He denies symptoms of hypotension.  He has not had headache, chest pain, shortness of breath, or vision changes.   ROS:  A comprehensive ROS was completed and negative except as noted per HPI  No Known Allergies  Past Medical History:  Diagnosis Date  . Diabetes mellitus without complication (HCC)   . Hyperlipidemia   . Hypertension   . Obese   . Retinal hemorrhage, both eyes   . Sessile colonic polyp 02/04/2019   Colonoscopy February 2020.    No past surgical history on file.  Social History   Socioeconomic History  . Marital status: Single    Spouse name: Not on file  . Number of children: Not on file  . Years of education: 61  . Highest education level: Not on file  Occupational History  . 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 Smoking status: Never Smoker  . Smokeless tobacco: Never Used  Substance and Sexual Activity  . Alcohol use: Yes    Comment: Rarely  . Drug use: No  . Sexual activity: Yes  Other Topics Concern  . Not on file  Social History Narrative   Marital Status: Single    Children: None   Pets: None    Living Situation: Lives alone   Occupation: Truck Physiological scientist)     Education:  12 th Grade     Tobacco Use/Exposure:  None    Alcohol Use:  Rarely   Drug Use:  None   Diet:  Regular   Exercise:  Very Active at work    Hobbies:  Sports administrator                Social Determinants of Teacher, early years/pre Strain:   . Difficulty of Paying Living Expenses: Not on file  Food Insecurity:   . Worried About Corporate investment banker in the Last Year: Not on file  . Ran Out of Food in the Last Year: Not on file  Transportation Needs:   . Lack of Transportation (Medical): Not on file  . Lack of Transportation (Non-Medical): Not on file  Physical Activity:   . Days of Exercise per Week: Not on file  . Minutes of Exercise per Session: Not on file  Stress:   . Feeling of Stress : Not on file  Social Connections:   . Frequency of Communication with Friends and Family: Not on file  . Frequency of Social Gatherings with Friends and Family: Not on file  . Attends Religious Services: Not on file  . Active Member of Clubs or Organizations: Not on file  . Attends Programme researcher, broadcasting/film/video Meetings: Not  on file  . Marital Status: Not on file    Family History  Problem Relation Age of Onset  . Arthritis Mother   . Hyperlipidemia Mother   . Hypertension Mother   . Cancer Father 44       Prostate (Metastatic) Lung, Esophagus, Stomach  . Alcohol abuse Father   . Kidney disease Brother   . Hypertension Brother   . Cancer Paternal Aunt        Breast Cancer  . Alzheimer's disease Maternal Grandmother   . Heart disease Paternal Grandmother   . Diabetes Brother   . Alcohol abuse Maternal Uncle     Health Maintenance  Topic Date Due  . HIV Screening  04/05/1983  . OPHTHALMOLOGY EXAM  12/21/2019  . INFLUENZA VACCINE  02/25/2020 (Originally 06/28/2019)  . HEMOGLOBIN A1C  07/01/2020  . FOOT EXAM  09/18/2020  . TETANUS/TDAP  11/17/2026  . COLONOSCOPY  01/24/2029  . PNEUMOCOCCAL POLYSACCHARIDE VACCINE AGE 2-64 HIGH RISK  Completed     ----------------------------------------------------------------------------------------------------------------------------------------------------------------------------------------------------------------- Physical Exam BP 136/68   Pulse 86   Wt 254 lb (115.2 kg)   SpO2 99%   BMI 37.51 kg/m   Physical Exam Constitutional:      Appearance: Normal appearance.  HENT:     Head: Normocephalic and atraumatic.  Eyes:     General: No scleral icterus. Cardiovascular:     Rate and Rhythm: Normal rate and regular rhythm.     Pulses: Normal pulses.     Heart sounds: Normal heart sounds.  Pulmonary:     Effort: Pulmonary effort is normal.     Breath sounds: Normal breath sounds.  Musculoskeletal:     Cervical back: Neck supple.  Skin:    General: Skin is warm and dry.     Findings: No rash.  Neurological:     General: No focal deficit present.     Mental Status: He is alert.  Psychiatric:        Mood and Affect: Mood normal.        Behavior: Behavior normal.     ------------------------------------------------------------------------------------------------------------------------------------------------------------------------------------------------------------------- Assessment and Plan  Type 2 diabetes mellitus with diabetic nephropathy Most recent A1c of  Lab Results  Component Value Date   HGBA1C 8.8 (A) 01/02/2020   indicates diabetes is not well controlled.  he  will be sure to take trulicity weekly in addition to other medications as well as work on lifestyle changes.  Counseled on healthy, low carb diet and recommend frequent activity to help with maintaining good control of blood sugars.    Hypertension associated with diabetes ([place]) Blood pressure is at goal at for age and co-morbidities.  I recommend he continue medications at current dose.  In addition they were instructed to follow a low sodium diet with regular exercise to help to maintain adequate  control of blood pressure.    Hyperlipidemia Tolerating crestor well, continue at current dose.    Erectile dysfunction Tadalafil provides some improvement.  He may want referral to urology in the future to discuss additional options.     This visit occurred during the SARS-CoV-2 public health emergency.  Safety protocols were in place, including screening questions prior to the visit, additional usage of staff PPE, and extensive cleaning of exam room while observing appropriate contact time as indicated for disinfecting solutions.

## 2020-03-15 ENCOUNTER — Other Ambulatory Visit: Payer: Self-pay | Admitting: Family Medicine

## 2020-03-17 ENCOUNTER — Other Ambulatory Visit: Payer: Self-pay | Admitting: Family Medicine

## 2020-03-26 ENCOUNTER — Other Ambulatory Visit: Payer: Self-pay | Admitting: Physician Assistant

## 2020-03-26 DIAGNOSIS — E119 Type 2 diabetes mellitus without complications: Secondary | ICD-10-CM

## 2020-04-02 ENCOUNTER — Ambulatory Visit: Payer: BC Managed Care – PPO | Admitting: Family Medicine

## 2020-05-13 ENCOUNTER — Other Ambulatory Visit: Payer: Self-pay

## 2020-05-13 DIAGNOSIS — I10 Essential (primary) hypertension: Secondary | ICD-10-CM

## 2020-05-13 MED ORDER — LISINOPRIL 20 MG PO TABS: 20.0000 mg | ORAL_TABLET | Freq: Every morning | ORAL | 0 refills | Status: DC

## 2020-06-22 ENCOUNTER — Other Ambulatory Visit: Payer: Self-pay

## 2020-06-22 DIAGNOSIS — I10 Essential (primary) hypertension: Secondary | ICD-10-CM

## 2020-06-22 MED ORDER — LISINOPRIL 20 MG PO TABS: 20.0000 mg | ORAL_TABLET | Freq: Every morning | ORAL | 0 refills | Status: DC

## 2020-06-26 ENCOUNTER — Other Ambulatory Visit: Payer: Self-pay | Admitting: Family Medicine

## 2020-07-26 ENCOUNTER — Ambulatory Visit: Payer: Self-pay | Admitting: Family Medicine

## 2020-08-13 ENCOUNTER — Telehealth: Payer: Self-pay | Admitting: Family Medicine

## 2020-08-13 ENCOUNTER — Other Ambulatory Visit: Payer: Self-pay | Admitting: Family Medicine

## 2020-08-13 NOTE — Telephone Encounter (Signed)
Per CMA, PC: Pt needs follow-up appt for hypertension.   Called patient and he was going to call us back when [place] is [place].

## 2020-08-26 ENCOUNTER — Other Ambulatory Visit: Payer: Self-pay

## 2020-08-29 IMAGING — DX DG CHEST 2V
2 series · 2 of 2 positions shown · non-contrast
Comparison: None.

CLINICAL DATA: Cough beginning 3 days ago.

EXAM:
CHEST - 2 VIEW

[chest pa]
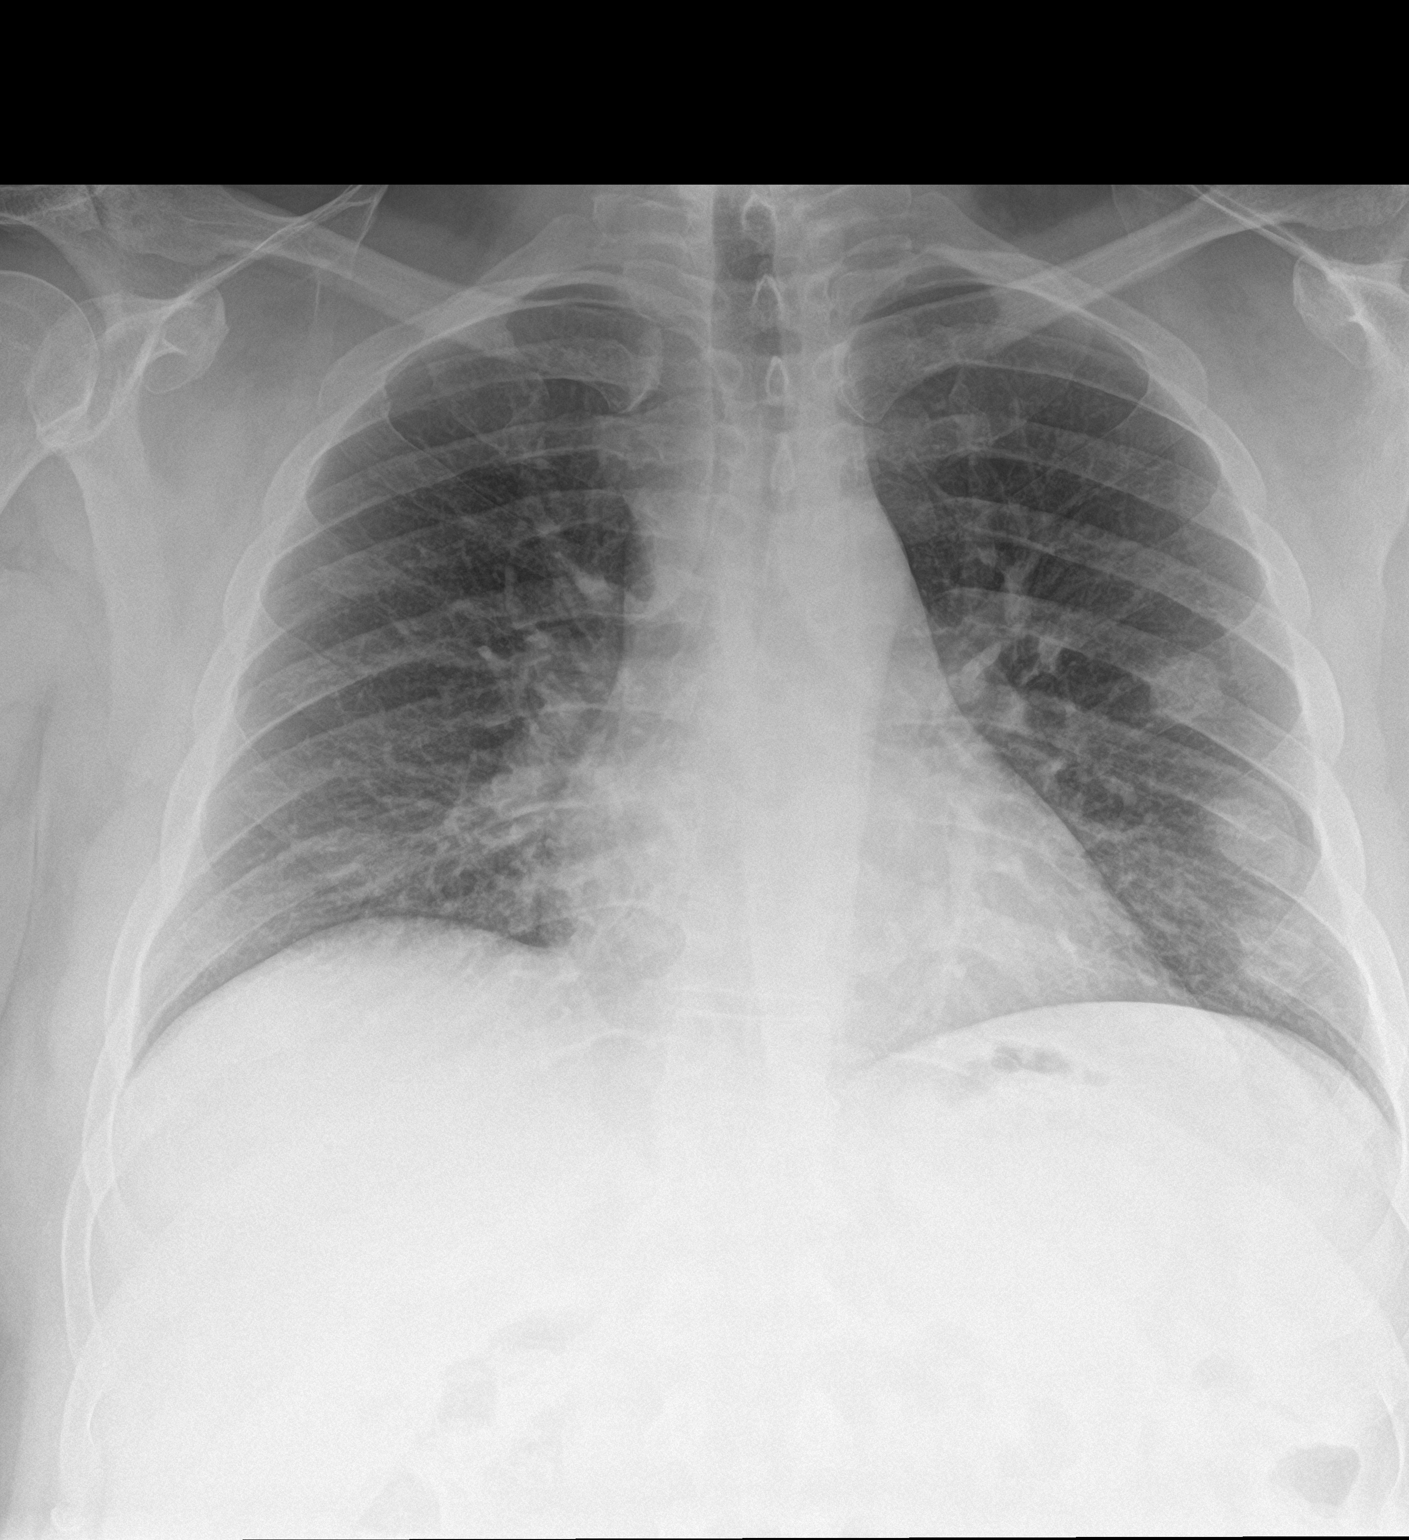

[chest lat]
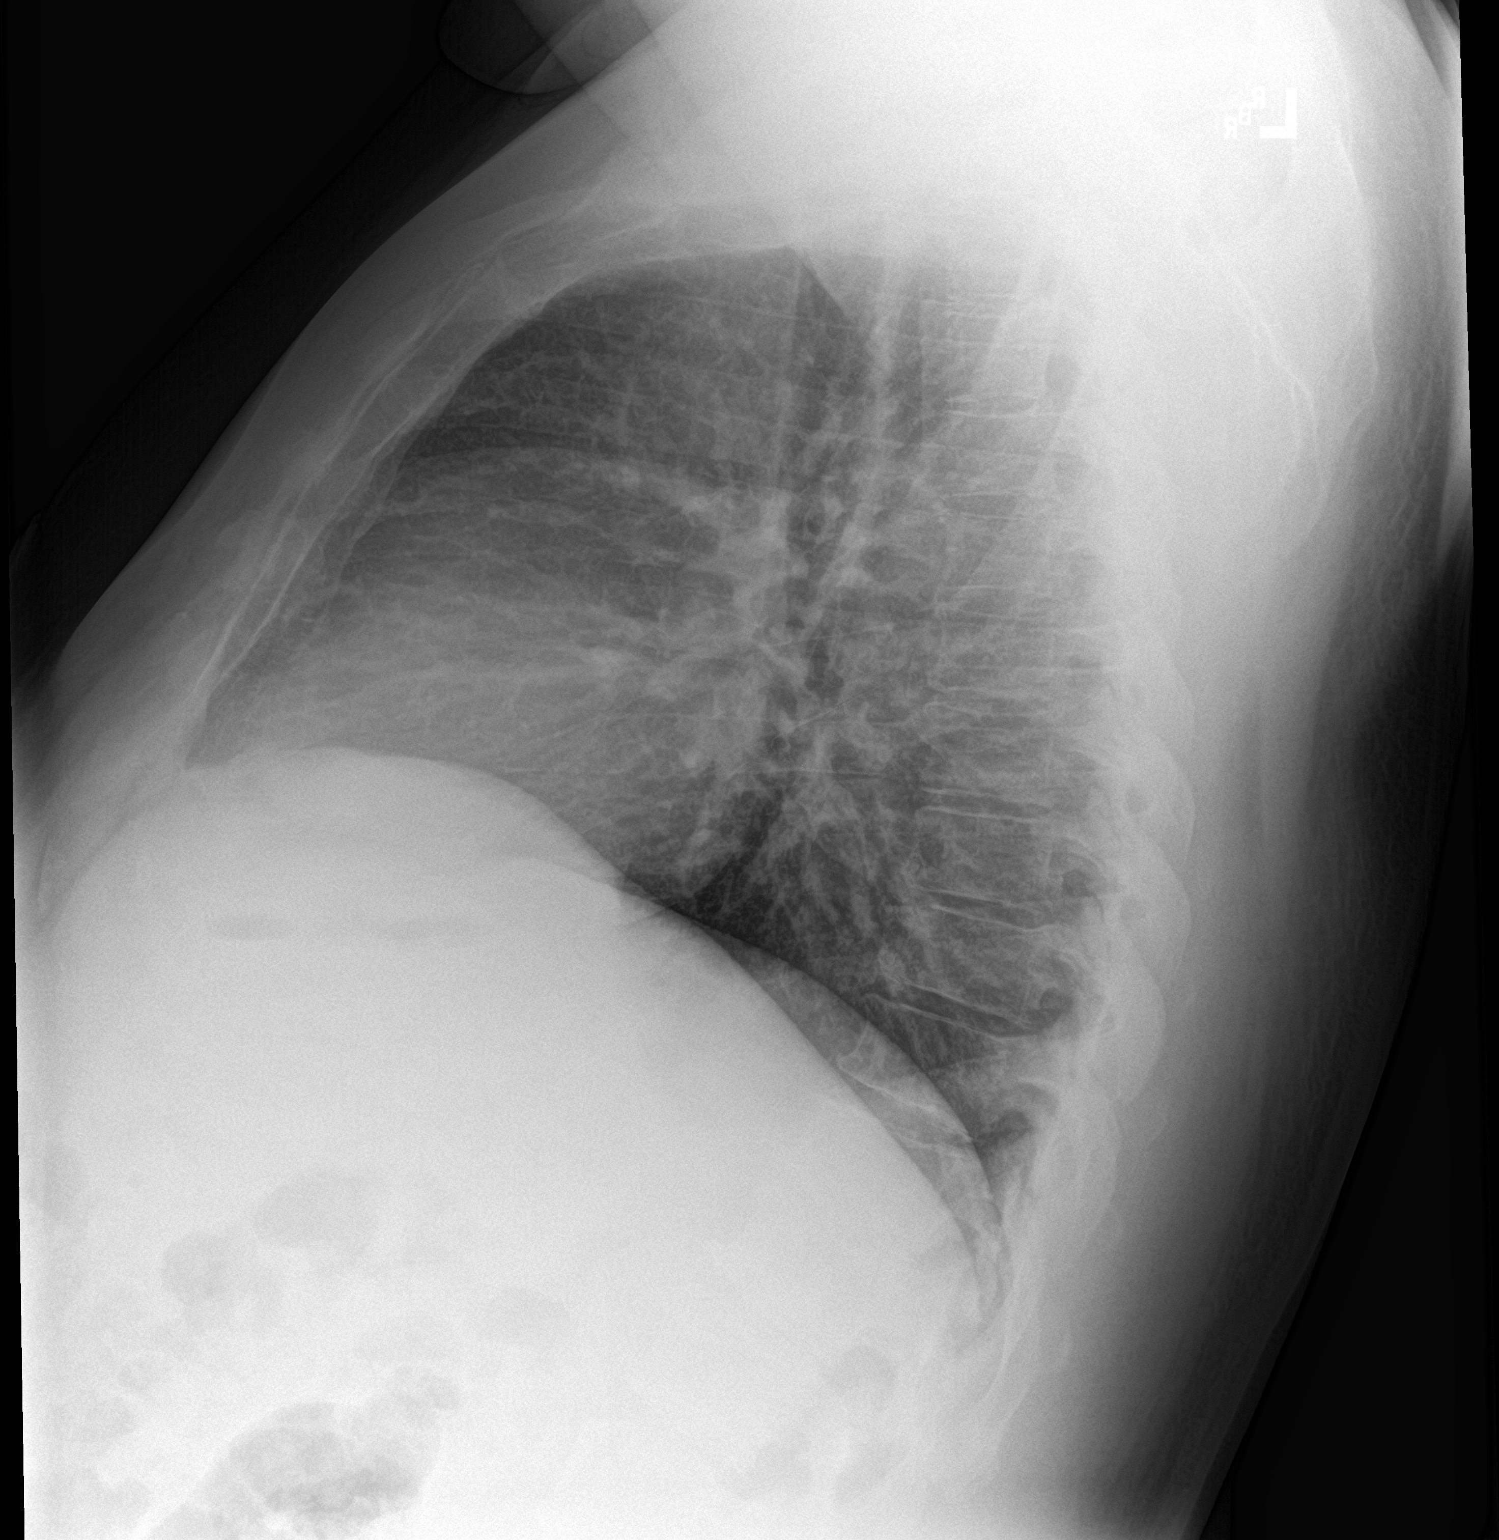

[2 of 2 positions shown; findings below may reference images not displayed]

FINDINGS: Heart size is normal. Mediastinal shadows are normal. There is
patchy infiltrate in the right middle lobe and right lower lobe. No
dense consolidation or lobar collapse. Old healed rib fractures of
the right sixth through eighth ribs.
IMPRESSION: Patchy infiltrate in the right middle lobe and right lower lobe
consistent with pneumonia.

## 2020-09-17 ENCOUNTER — Other Ambulatory Visit: Payer: Self-pay

## 2020-09-17 ENCOUNTER — Ambulatory Visit (INDEPENDENT_AMBULATORY_CARE_PROVIDER_SITE_OTHER): Payer: Self-pay | Admitting: Family Medicine

## 2020-09-17 ENCOUNTER — Encounter: Payer: Self-pay | Admitting: Family Medicine

## 2020-09-17 VITALS — BP 154/80 | HR 77 | Temp 97.6°F | Wt 245.6 lb

## 2020-09-17 DIAGNOSIS — Z794 Long term (current) use of insulin: Secondary | ICD-10-CM

## 2020-09-17 DIAGNOSIS — E1169 Type 2 diabetes mellitus with other specified complication: Secondary | ICD-10-CM

## 2020-09-17 DIAGNOSIS — I152 Hypertension secondary to endocrine disorders: Secondary | ICD-10-CM

## 2020-09-17 DIAGNOSIS — E113553 Type 2 diabetes mellitus with stable proliferative diabetic retinopathy, bilateral: Secondary | ICD-10-CM

## 2020-09-17 DIAGNOSIS — E782 Mixed hyperlipidemia: Secondary | ICD-10-CM

## 2020-09-17 DIAGNOSIS — N420 Calculus of prostate: Secondary | ICD-10-CM

## 2020-09-17 DIAGNOSIS — E119 Type 2 diabetes mellitus without complications: Secondary | ICD-10-CM

## 2020-09-17 DIAGNOSIS — E1121 Type 2 diabetes mellitus with diabetic nephropathy: Secondary | ICD-10-CM

## 2020-09-17 DIAGNOSIS — E1159 Type 2 diabetes mellitus with other circulatory complications: Secondary | ICD-10-CM

## 2020-09-17 DIAGNOSIS — E785 Hyperlipidemia, unspecified: Secondary | ICD-10-CM

## 2020-09-17 LAB — POCT GLYCOSYLATED HEMOGLOBIN (HGB A1C): Hemoglobin A1C: 10.8 % — AB (ref 4.0–5.6)

## 2020-09-17 MED ORDER — TADALAFIL 20 MG PO TABS
20.0000 mg | ORAL_TABLET | ORAL | 1 refills | Status: DC
Start: 2020-09-17 — End: 2021-07-04

## 2020-09-17 MED ORDER — INVOKAMET 150-1000 MG PO TABS
1.0000 | ORAL_TABLET | Freq: Two times a day (BID) | ORAL | 3 refills | Status: DC
Start: 2020-09-17 — End: 2020-10-26

## 2020-09-17 MED ORDER — PIOGLITAZONE HCL 45 MG PO TABS: 45.0000 mg | ORAL_TABLET | Freq: Every day | ORAL | 3 refills | Status: DC

## 2020-09-17 MED ORDER — ROSUVASTATIN CALCIUM 20 MG PO TABS
20.0000 mg | ORAL_TABLET | Freq: Every day | ORAL | 3 refills | Status: DC
Start: 2020-09-17 — End: 2021-07-04

## 2020-09-17 MED ORDER — TOUJEO SOLOSTAR 300 UNIT/ML ~~LOC~~ SOPN: 30.0000 [IU] | PEN_INJECTOR | Freq: Every day | SUBCUTANEOUS | 2 refills | Status: DC

## 2020-09-17 NOTE — Progress Notes (Signed)
Edward Riley - 54 y.o. male MRN [id number]  Date of birth: [birth year]  Subjective Chief Complaint  Patient presents with  . Diabetes    HPI Edward Riley is a 54 y.o. male here today for follow up of HTN and DM.  He reports that overall he is doing pretty well.  He feels good but does not check his blood sugars.  He recently returned from working in [place].  Admits to overindulging in unhealthy foods and EtOH during this trip.  His diabetes is currently treated with treated invokamet, toujeo, and actos.  He was unable to tolerate trulicity due to nausea.  Current insulin dosing is at 25 units daily. He denies increased thirst or urination.   HTN is currently treated with HCTZ, amlodipine, and lisinopril.  He is taking as directed.  He denies chest pain, shortness of breath, palpitations, headache or vision changes.   HLD is treated with crestor, tolerating well without myalgias.    ROS:  A comprehensive ROS was completed and negative except as noted per HPI   No Known Allergies    Past Medical History:  Diagnosis Date  . Diabetes mellitus without complication (HCC)   . Hyperlipidemia   . Hypertension   . Obese   . Retinal hemorrhage, both eyes   . Sessile colonic polyp 02/04/2019   Colonoscopy February 2020.    No past surgical history on file.  Social History   Socioeconomic History  . Marital status: Single    Spouse name: Not on file  . Number of children: Not on file  . Years of education: 67  . Highest education level: Not on file  Occupational History  . 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 Smoking status: Never Smoker  . Smokeless tobacco: Never Used  Substance and Sexual Activity  . Alcohol use: Yes    Comment: Rarely  . Drug use: No  . Sexual activity: Yes  Other Topics Concern  . Not on file  Social History Narrative   Marital Status: Single    Children: None   Pets:  None    Living Situation: Lives alone   Occupation: Truck Sports administrator)     Education:  12 th Grade    Tobacco Use/Exposure:  None    Alcohol Use:  Rarely   Drug Use:  None   Diet:  Regular   Exercise:  Very Active at work    Hobbies:  Teacher, early years/pre                Social Determinants of Corporate investment banker Strain:   . Difficulty of Paying Living Expenses: Not on file  Food Insecurity:   . Worried About Programme researcher, broadcasting/film/video in the Last Year: Not on file  . Ran Out of Food in the Last Year: Not on file  Transportation Needs:   . Lack of Transportation (Medical): Not on file  . Lack of Transportation (Non-Medical): Not on file  Physical Activity:   . Days of Exercise per Week: Not on file  . Minutes of Exercise per Session: Not on file  Stress:   . Feeling of Stress : Not on file  Social Connections:   . Frequency of Communication with Friends and Family: Not on file  . Frequency of Social Gatherings with Friends and Family: Not on file  . Attends Religious Services: Not on file  .  Active Member of Clubs or Organizations: Not on file  . Attends Banker Meetings: Not on file  . Marital Status: Not on file    Family History  Problem Relation Age of Onset  . Arthritis Mother   . Hyperlipidemia Mother   . Hypertension Mother   . Cancer Father 42       Prostate (Metastatic) Lung, Esophagus, Stomach  . Alcohol abuse Father   . Kidney disease Brother   . Hypertension Brother   . Cancer Paternal Aunt        Breast Cancer  . Alzheimer's disease Maternal Grandmother   . Heart disease Paternal Grandmother   . Diabetes Brother   . Alcohol abuse Maternal Uncle     Health Maintenance  Topic Date Due  . Hepatitis C Screening  Never done  . COVID-19 Vaccine (1) Never done  . HIV Screening  Never done  . URINE MICROALBUMIN  06/02/2017  . INFLUENZA VACCINE  06/27/2020  . HEMOGLOBIN A1C  07/01/2020  . FOOT EXAM  09/18/2020  .  OPHTHALMOLOGY EXAM  01/01/2021  . TETANUS/TDAP  11/17/2026  . COLONOSCOPY  01/24/2029  . PNEUMOCOCCAL POLYSACCHARIDE VACCINE AGE 49-64 HIGH RISK  Completed     ----------------------------------------------------------------------------------------------------------------------------------------------------------------------------------------------------------------- Physical Exam BP (!) 154/80 (BP Location: Left Arm, Patient Position: Sitting, Cuff Size: Normal)   Pulse 77   Temp 97.6 F (36.4 C)   Wt 245 lb 9.6 oz (111.4 kg)   SpO2 98%   BMI 36.27 kg/m   Physical Exam Constitutional:      Appearance: Normal appearance.  HENT:     Head: Normocephalic and atraumatic.  Eyes:     General: No scleral icterus. Cardiovascular:     Rate and Rhythm: Normal rate and regular rhythm.  Pulmonary:     Effort: Pulmonary effort is normal.     Breath sounds: Normal breath sounds.  Musculoskeletal:     Cervical back: Neck supple.  Skin:    General: Skin is warm and dry.  Neurological:     General: No focal deficit present.     Mental Status: He is alert.  Psychiatric:        Mood and Affect: Mood normal.        Behavior: Behavior normal.     ------------------------------------------------------------------------------------------------------------------------------------------------------------------------------------------------------------------- Assessment and Plan  Hypertension associated with diabetes (HCC) BP elevated today.  Has not taken meds for today yet.  He will take these once he gets home.  Recommend low sodium diet.    Type 2 diabetes mellitus with diabetic nephropathy Most recent A1c of  Lab Results  Component Value Date   HGBA1C 10.8 (A) 09/17/2020   indicates diabetes is not well controlled.  He will work on dietary change and increase toujeo to 30 units.  Counseled on healthy, low carb diet and recommend frequent activity to help with maintaining good  control of blood sugars.    Hyperlipidemia associated with type 2 diabetes mellitus (HCC) He is tolerating crestor well Update lipid panel.    Meds ordered this encounter  Medications  . insulin glargine, 1 Unit Dial, (TOUJEO SOLOSTAR) 300 UNIT/ML Solostar Pen    Sig: Inject 30 Units into the skin daily.    Dispense:  6 mL    Refill:  2  . rosuvastatin (CRESTOR) 20 MG tablet    Sig: Take 1 tablet (20 mg total) by mouth daily.    Dispense:  90 tablet    Refill:  3  . tadalafil (CIALIS) 20 MG  tablet    Sig: Take 1 tablet (20 mg total) by mouth every other day.    Dispense:  90 tablet    Refill:  1  . Canagliflozin-metFORMIN HCl (INVOKAMET) [phone] MG TABS    Sig: Take 1 tablet by mouth 2 (two) times daily.    Dispense:  180 tablet    Refill:  3  . pioglitazone (ACTOS) 45 MG tablet    Sig: Take 1 tablet (45 mg total) by mouth daily.    Dispense:  90 tablet    Refill:  3    No follow-ups on file.    This visit occurred during the SARS-CoV-2 public health emergency.  Safety protocols were in place, including screening questions prior to the visit, additional usage of staff PPE, and extensive cleaning of exam room while observing appropriate contact time as indicated for disinfecting solutions.

## 2020-09-17 NOTE — Patient Instructions (Addendum)
Great to see you today.  Please increase toujeo to 30 units daily.  See me again in about 3 months.     Diabetes Mellitus and Nutrition, Adult When you have diabetes (diabetes mellitus), it is very important to have healthy eating habits because your blood sugar (glucose) levels are greatly affected by what you eat and drink. Eating healthy foods in the appropriate amounts, at about the same times every day, can help you:  Control your blood glucose.  Lower your risk of heart disease.  Improve your blood pressure.  Reach or maintain a healthy weight. Every person with diabetes is different, and each person has different needs for a meal plan. Your health care provider may recommend that you work with a diet and nutrition specialist (dietitian) to make a meal plan that is best for you. Your meal plan may vary depending on factors such as:  The calories you need.  The medicines you take.  Your weight.  Your blood glucose, blood pressure, and cholesterol levels.  Your activity level.  Other health conditions you have, such as heart or kidney disease. How do carbohydrates affect me? Carbohydrates, also called carbs, affect your blood glucose level more than any other type of food. Eating carbs naturally raises the amount of glucose in your blood. Carb counting is a method for keeping track of how many carbs you eat. Counting carbs is important to keep your blood glucose at a healthy level, especially if you use insulin or take certain oral diabetes medicines. It is important to know how many carbs you can safely have in each meal. This is different for every person. Your dietitian can help you calculate how many carbs you should have at each meal and for each snack. Foods that contain carbs include:  Bread, cereal, rice, pasta, and crackers.  Potatoes and corn.  Peas, beans, and lentils.  Milk and yogurt.  Fruit and juice.  Desserts, such as cakes, cookies, ice cream, and  candy. How does alcohol affect me? Alcohol can cause a sudden decrease in blood glucose (hypoglycemia), especially if you use insulin or take certain oral diabetes medicines. Hypoglycemia can be a life-threatening condition. Symptoms of hypoglycemia (sleepiness, dizziness, and confusion) are similar to symptoms of having too much alcohol. If your health care provider says that alcohol is safe for you, follow these guidelines:  Limit alcohol intake to no more than 1 drink per day for nonpregnant women and 2 drinks per day for men. One drink equals 12 oz of beer, 5 oz of wine, or 1 oz of hard liquor.  Do not drink on an empty stomach.  Keep yourself hydrated with water, diet soda, or unsweetened iced tea.  Keep in mind that regular soda, juice, and other mixers may contain a lot of sugar and must be counted as carbs. What are tips for following this plan?  Reading food labels  Start by checking the serving size on the "Nutrition Facts" label of packaged foods and drinks. The amount of calories, carbs, fats, and other nutrients listed on the label is based on one serving of the item. Many items contain more than one serving per package.  Check the total grams (g) of carbs in one serving. You can calculate the number of servings of carbs in one serving by dividing the total carbs by 15. For example, if a food has 30 g of total carbs, it would be equal to 2 servings of carbs.  Check the number of grams (  g) of saturated and trans fats in one serving. Choose foods that have low or no amount of these fats.  Check the number of milligrams (mg) of salt (sodium) in one serving. Most people should limit total sodium intake to less than 2,300 mg per day.  Always check the nutrition information of foods labeled as "low-fat" or "nonfat". These foods may be higher in added sugar or refined carbs and should be avoided.  Talk to your dietitian to identify your daily goals for nutrients listed on the  label. Shopping  Avoid buying canned, premade, or processed foods. These foods tend to be high in fat, sodium, and added sugar.  Shop around the outside edge of the grocery store. This includes fresh fruits and vegetables, bulk grains, fresh meats, and fresh dairy. Cooking  Use low-heat cooking methods, such as baking, instead of high-heat cooking methods like deep frying.  Cook using healthy oils, such as olive, canola, or sunflower oil.  Avoid cooking with butter, cream, or high-fat meats. Meal planning  Eat meals and snacks regularly, preferably at the same times every day. Avoid going long periods of time without eating.  Eat foods high in fiber, such as fresh fruits, vegetables, beans, and whole grains. Talk to your dietitian about how many servings of carbs you can eat at each meal.  Eat 4-6 ounces (oz) of lean protein each day, such as lean meat, chicken, fish, eggs, or tofu. One oz of lean protein is equal to: ? 1 oz of meat, chicken, or fish. ? 1 egg. ?  cup of tofu.  Eat some foods each day that contain healthy fats, such as avocado, nuts, seeds, and fish. Lifestyle  Check your blood glucose regularly.  Exercise regularly as told by your health care provider. This may include: ? 150 minutes of moderate-intensity or vigorous-intensity exercise each week. This could be brisk walking, biking, or water aerobics. ? Stretching and doing strength exercises, such as yoga or weightlifting, at least 2 times a week.  Take medicines as told by your health care provider.  Do not use any products that contain nicotine or tobacco, such as cigarettes and e-cigarettes. If you need help quitting, ask your health care provider.  Work with a Social worker or diabetes educator to identify strategies to manage stress and any emotional and social challenges. Questions to ask a health care provider  Do I need to meet with a diabetes educator?  Do I need to meet with a dietitian?  What  number can I call if I have questions?  When are the best times to check my blood glucose? Where to find more information:  American Diabetes Association: diabetes.org  Academy of Nutrition and Dietetics: www.eatright.[employer] of Diabetes and Digestive and Kidney Diseases (NIH): [employer] Summary  A healthy meal plan will help you control your blood glucose and maintain a healthy lifestyle.  Working with a diet and nutrition specialist (dietitian) can help you make a meal plan that is best for you.  Keep in mind that carbohydrates (carbs) and alcohol have immediate effects on your blood glucose levels. It is important to count carbs and to use alcohol carefully. This information is not intended to replace advice given to you by your health care provider. Make sure you discuss any questions you have with your health care provider. Document Revised: 10/26/2017 Document Reviewed: 12/18/2016 Elsevier Patient Education  2020 [employer].

## 2020-09-18 LAB — CBC
HCT: 45.1 % (ref 38.5–50.0)
Hemoglobin: 15.5 g/dL (ref 13.2–17.1)
MCH: 31.7 pg (ref 27.0–33.0)
MCHC: 34.4 g/dL (ref 32.0–36.0)
MCV: 92.2 fL (ref 80.0–100.0)
MPV: 12.5 fL (ref 7.5–12.5)
Platelets: 194 10*3/uL (ref 140–400)
RBC: 4.89 10*6/uL (ref 4.20–5.80)
RDW: 12 % (ref 11.0–15.0)
WBC: 7.5 10*3/uL (ref 3.8–10.8)

## 2020-09-18 LAB — COMPLETE METABOLIC PANEL WITH GFR
AG Ratio: 2 (calc) (ref 1.0–2.5)
ALT: 17 U/L (ref 9–46)
AST: 14 U/L (ref 10–35)
Albumin: 4.7 g/dL (ref 3.6–5.1)
Alkaline phosphatase (APISO): 71 U/L (ref 35–144)
BUN/Creatinine Ratio: 23 (calc) — ABNORMAL HIGH (ref 6–22)
BUN: 27 mg/dL — ABNORMAL HIGH (ref 7–25)
CO2: 27 mmol/L (ref 20–32)
Calcium: 10.2 mg/dL (ref 8.6–10.3)
Chloride: 100 mmol/L (ref 98–110)
Creat: 1.18 mg/dL (ref 0.70–1.33)
GFR, Est African American: 82 mL/min/{1.73_m2} (ref >=60)
GFR, Est Non African American: 71 mL/min/{1.73_m2} (ref >=60)
Globulin: 2.3 g/dL (calc) (ref 1.9–3.7)
Glucose, Bld: 337 mg/dL — ABNORMAL HIGH (ref 65–139)
Potassium: 5.2 mmol/L (ref 3.5–5.3)
Sodium: 136 mmol/L (ref 135–146)
Total Bilirubin: 0.6 mg/dL (ref 0.2–1.2)
Total Protein: 7 g/dL (ref 6.1–8.1)

## 2020-09-18 LAB — LIPID PANEL
Cholesterol: 275 mg/dL — ABNORMAL HIGH (ref <200)
HDL: 32 mg/dL — ABNORMAL LOW (ref >=40)
Non-HDL Cholesterol (Calc): 243 mg/dL (calc) — ABNORMAL HIGH (ref <130)
Total CHOL/HDL Ratio: 8.6 (calc) — ABNORMAL HIGH (ref <5.0)
Triglycerides: 623 mg/dL — ABNORMAL HIGH (ref <150)

## 2020-09-18 LAB — PSA: PSA: 0.28 ng/mL (ref <=4.0)

## 2020-09-19 NOTE — Assessment & Plan Note (Signed)
He is tolerating crestor well Update lipid panel.

## 2020-09-19 NOTE — Assessment & Plan Note (Signed)
BP elevated today.  Has not taken meds for today yet.  He will take these once he gets home.  Recommend low sodium diet.

## 2020-09-19 NOTE — Assessment & Plan Note (Signed)
Most recent A1c of  Lab Results  Component Value Date   HGBA1C 10.8 (A) 09/17/2020   indicates diabetes is not well controlled.  He will work on dietary change and increase toujeo to 30 units.  Counseled on healthy, low carb diet and recommend frequent activity to help with maintaining good control of blood sugars.

## 2020-09-24 ENCOUNTER — Encounter: Payer: Self-pay | Admitting: Family Medicine

## 2020-09-28 ENCOUNTER — Other Ambulatory Visit: Payer: Self-pay | Admitting: Family Medicine

## 2020-10-01 ENCOUNTER — Other Ambulatory Visit: Payer: Self-pay | Admitting: Family Medicine

## 2020-10-01 DIAGNOSIS — E119 Type 2 diabetes mellitus without complications: Secondary | ICD-10-CM

## 2020-10-26 ENCOUNTER — Encounter: Payer: Self-pay | Admitting: Family Medicine

## 2020-10-26 ENCOUNTER — Other Ambulatory Visit: Payer: Self-pay

## 2020-10-26 MED ORDER — INVOKAMET 150-1000 MG PO TABS
1.0000 | ORAL_TABLET | Freq: Two times a day (BID) | ORAL | 3 refills | Status: DC
Start: 2020-10-26 — End: 2021-05-26

## 2020-10-28 NOTE — Telephone Encounter (Signed)
Received PA request for Invokamet. Patient using coupon card, PA not needed.

## 2020-11-02 ENCOUNTER — Other Ambulatory Visit: Payer: Self-pay | Admitting: Family Medicine

## 2020-11-18 ENCOUNTER — Ambulatory Visit (INDEPENDENT_AMBULATORY_CARE_PROVIDER_SITE_OTHER): Payer: Self-pay | Admitting: Family Medicine

## 2020-11-18 ENCOUNTER — Other Ambulatory Visit: Payer: Self-pay

## 2020-11-18 DIAGNOSIS — E119 Type 2 diabetes mellitus without complications: Secondary | ICD-10-CM

## 2020-11-18 LAB — POCT GLYCOSYLATED HEMOGLOBIN (HGB A1C): HbA1c POC (<> result, manual entry): 7.6 % (ref 4.0–5.6)

## 2020-11-18 NOTE — Progress Notes (Signed)
Lab only 

## 2020-12-01 ENCOUNTER — Encounter: Payer: Self-pay | Admitting: Family Medicine

## 2020-12-02 ENCOUNTER — Other Ambulatory Visit: Payer: Self-pay

## 2020-12-02 ENCOUNTER — Telehealth (INDEPENDENT_AMBULATORY_CARE_PROVIDER_SITE_OTHER): Payer: HRSA Program | Admitting: Family Medicine

## 2020-12-02 ENCOUNTER — Encounter: Payer: Self-pay | Admitting: Family Medicine

## 2020-12-02 VITALS — Temp 97.9°F | Wt 249.0 lb

## 2020-12-02 DIAGNOSIS — J029 Acute pharyngitis, unspecified: Secondary | ICD-10-CM | POA: Diagnosis not present

## 2020-12-02 DIAGNOSIS — Z20822 Contact with and (suspected) exposure to covid-19: Secondary | ICD-10-CM

## 2020-12-02 DIAGNOSIS — U071 COVID-19: Secondary | ICD-10-CM

## 2020-12-02 LAB — POCT INFLUENZA A/B
Influenza A, POC: NEGATIVE
Influenza B, POC: NEGATIVE

## 2020-12-02 LAB — POCT RAPID STREP A (OFFICE): Rapid Strep A Screen: POSITIVE — AB

## 2020-12-02 MED ORDER — AMOXICILLIN 500 MG PO CAPS: 500.0000 mg | ORAL_CAPSULE | Freq: Two times a day (BID) | ORAL | 0 refills | Status: AC

## 2020-12-02 NOTE — Assessment & Plan Note (Addendum)
He has not had much relief with OTC analgesics.  Unable to tolerate salt water gargles.  He will stop by to have COVID, flu and strep testing. Recommend adding chloraseptic spray as needed.  If not getting relief with this I can send over a few tramadol for pain management.    Addendum : Influenza negative.  Strep test positive, starting amoxicillin 500mg  BID x10 days.  He will let me know if symptoms worsen or are not improving.

## 2020-12-02 NOTE — Progress Notes (Signed)
Symptoms started Tuesday morning. Progressively getting worse. No congestion just a sore throat.  Meds: cepacol, tylenol, nyquil, gargling (makes him throw up and worse)

## 2020-12-02 NOTE — Addendum Note (Signed)
Addended by: Mammie Lorenzo on: 12/02/2020 05:03 PM   Modules accepted: Orders

## 2020-12-02 NOTE — Addendum Note (Signed)
Addended by: Ardyth Man on: 12/02/2020 01:54 PM   Modules accepted: Orders

## 2020-12-02 NOTE — Progress Notes (Addendum)
Edward Riley - 55 y.o. male MRN [id number]  Date of birth: 03-Jan-1966   This visit type was conducted due to national recommendations for restrictions regarding the COVID-19 Pandemic (e.g. social distancing).  This format is felt to be most appropriate for this patient at this time.  All issues noted in this document were discussed and addressed.  No physical exam was performed (except for noted visual exam findings with Video Visits).  I discussed the limitations of evaluation and management by telemedicine and the availability of in person appointments. The patient expressed understanding and agreed to proceed.  I connected with@ on 12/02/20 at 11:30 AM EST by a video enabled telemedicine application and verified that I am speaking with the correct person using two identifiers.  Present at visit: Luetta Nutting, DO [name]   Patient Location: Home [place] [address]   Provider location:   [place]  No chief complaint on file.   HPI  Hillman Attig is a 55 y.o. male who presents via audio/video conferencing for a telehealth visit today.  He has complaint today of severe sore throat. Painful to swallow.  Symptoms started a couple of days ago.  He has some mild congestion and cough as well. Lymph nodes in front of neck are swollen and tender.  Temp as high as 99.7.  Denies chills, nausea, vomiting, diarrhea, headache or body aches.  He has tried tylenol and cepacol without much relief.  He tried gargling salt water but this made him gag.  Had J&J vaccine previously but has not gotten booster.     ROS:  A comprehensive ROS was completed and negative except as noted per HPI  Past Medical History:  Diagnosis Date  . Diabetes mellitus without complication ([facility] [place])   . Hyperlipidemia   . Hypertension   . Obese   . Retinal hemorrhage, both eyes   . Sessile colonic polyp 02/04/2019   Colonoscopy February 2020.    History reviewed. No pertinent  surgical history.  Family History  Problem Relation Age of Onset  . Arthritis Mother   . Hyperlipidemia Mother   . Hypertension Mother   . Cancer Father 65       Prostate (Metastatic) Lung, Esophagus, Stomach  . Alcohol abuse Father   . Kidney disease Brother   . Hypertension Brother   . Cancer Paternal Aunt        Breast Cancer  . Alzheimer's disease Maternal Grandmother   . Heart disease Paternal Grandmother   . Diabetes Brother   . Alcohol abuse Maternal Uncle     Social History   Socioeconomic History  . Marital status: Single    Spouse name: Not on file  . Number of children: Not on file  . Years of education: 93  . Highest education level: Not on file  Occupational History  . Occupation: Engineer, maintenance: other - Aeronautical engineer    Comment: [employer]  Tobacco Use  . Smoking status: Never Smoker  . Smokeless tobacco: Never Used  Substance and Sexual Activity  . Alcohol use: Yes    Comment: Rarely  . Drug use: No  . Sexual activity: Yes  Other Topics Concern  . Not on file  Social History Narrative   Marital Status: Single    Children: None   Pets: None    Living Situation: Lives alone   Occupation: Truck Radio producer)     Education:  12 th  Grade    Tobacco Use/Exposure:  None    Alcohol Use:  Rarely   Drug Use:  None   Diet:  Regular   Exercise:  Very Active at work    Hobbies:  Merchant navy officer                Social Determinants of Radio broadcast assistant Strain: Not on file  Food Insecurity: Not on file  Transportation Needs: Not on file  Physical Activity: Not on file  Stress: Not on file  Social Connections: Not on file  Intimate Partner Violence: Not on file     Current Outpatient Medications:  .  AMBULATORY NON FORMULARY MEDICATION, Bayer test strips and lancets. Use to check blood sugar twice a day. Dx: Type Two Diabetes Mellitus, Disp: 100 Units, Rfl: 11 .  amLODipine (NORVASC) 10 MG tablet, TAKE 1  TABLET(10 MG) BY MOUTH DAILY, Disp: 90 tablet, Rfl: 2 .  BD PEN NEEDLE NANO U/F 32G X 4 MM MISC, USE AS DIRECTED, Disp: 200 each, Rfl: 2 .  Blood Glucose Monitoring Suppl (ONE TOUCH ULTRA 2) w/Device KIT, Use to check blood sugar twice a day. Send strips and lancets, quantity 100 refills 3.  Dx: Type Two Diabetes Mellitus, Disp: 1 each, Rfl: 0 .  Canagliflozin-metFORMIN HCl (INVOKAMET) [phone] MG TABS, Take 1 tablet by mouth 2 (two) times daily., Disp: 180 tablet, Rfl: 3 .  Continuous Blood Gluc Sensor ([place]) MISC, Use as directed. Change device every 2 weeks, Disp: 1 each, Rfl: 12 .  glucose blood (ONE TOUCH ULTRA TEST) test strip, Use as instructed, Disp: 100 each, Rfl: 12 .  hydrochlorothiazide (HYDRODIURIL) 25 MG tablet, TAKE 1 TABLET(25 MG) BY MOUTH DAILY, Disp: 90 tablet, Rfl: 1 .  insulin glargine, 1 Unit Dial, (TOUJEO SOLOSTAR) 300 UNIT/ML Solostar Pen, Inject 30 Units into the skin daily., Disp: 6 mL, Rfl: 2 .  Insulin Pen Needle (PEN NEEDLES) 31G X 8 MM MISC, Use the pen needles for both the insulin and victoza pens daily, Disp: 100 each, Rfl: 12 .  Lancet Device MISC, Check blood sugar once or twice daily as needed., Disp: 100 each, Rfl: prn .  pioglitazone (ACTOS) 45 MG tablet, Take 1 tablet (45 mg total) by mouth daily., Disp: 90 tablet, Rfl: 3 .  rosuvastatin (CRESTOR) 20 MG tablet, Take 1 tablet (20 mg total) by mouth daily., Disp: 90 tablet, Rfl: 3 .  tadalafil (CIALIS) 20 MG tablet, Take 1 tablet (20 mg total) by mouth every other day., Disp: 90 tablet, Rfl: 1 .  lisinopril (ZESTRIL) 20 MG tablet, Take 1 tablet (20 mg total) by mouth every morning for 15 days., Disp: 30 tablet, Rfl: 0  EXAM:  VITALS per patient if applicable: Temp 25.0 F (36.6 C)   Wt 249 lb (112.9 kg)   BMI 36.77 kg/m   GENERAL: alert, oriented, appears well and in no acute distress  HEENT: atraumatic, conjunttiva clear, no obvious abnormalities on inspection of external nose and  ears  NECK: normal movements of the head and neck  LUNGS: on inspection no signs of respiratory distress, breathing rate appears normal, no obvious gross SOB, gasping or wheezing  CV: no obvious cyanosis  MS: moves all visible extremities without noticeable abnormality  PSYCH/NEURO: pleasant and cooperative, no obvious depression or anxiety, speech and thought processing grossly intact  ASSESSMENT AND PLAN:  Discussed the following assessment and plan:  Pharyngitis He has not had much relief with OTC  analgesics.  Unable to tolerate salt water gargles.  He will stop by to have COVID, flu and strep testing. Recommend adding chloraseptic spray as needed.  If not getting relief with this I can send over a few tramadol for pain management.    Addendum : Influenza negative.  Strep test positive, starting amoxicillin 554m BID x10 days.  He will let me know if symptoms worsen or are not improving.       I discussed the assessment and treatment plan with the patient. The patient was provided an opportunity to ask questions and all were answered. The patient agreed with the plan and demonstrated an understanding of the instructions.   The patient was advised to call back or seek an in-person evaluation if the symptoms worsen or if the condition fails to improve as anticipated.    CLuetta Nutting DO

## 2020-12-03 ENCOUNTER — Ambulatory Visit: Payer: Self-pay | Admitting: Family Medicine

## 2020-12-04 LAB — NOVEL CORONAVIRUS, NAA: SARS-CoV-2, NAA: DETECTED — AB

## 2020-12-04 LAB — SARS-COV-2, NAA 2 DAY TAT

## 2020-12-08 ENCOUNTER — Encounter: Payer: Self-pay | Admitting: Family Medicine

## 2020-12-08 DIAGNOSIS — I10 Essential (primary) hypertension: Secondary | ICD-10-CM

## 2020-12-09 MED ORDER — LISINOPRIL 20 MG PO TABS
20.0000 mg | ORAL_TABLET | Freq: Every morning | ORAL | 0 refills | Status: DC
Start: 2020-12-09 — End: 2021-02-01

## 2021-01-31 ENCOUNTER — Other Ambulatory Visit: Payer: Self-pay | Admitting: Family Medicine

## 2021-01-31 DIAGNOSIS — I10 Essential (primary) hypertension: Secondary | ICD-10-CM

## 2021-02-24 ENCOUNTER — Other Ambulatory Visit: Payer: Self-pay

## 2021-02-24 MED ORDER — AMLODIPINE BESYLATE 10 MG PO TABS: ORAL_TABLET | ORAL | 0 refills | Status: DC

## 2021-02-28 ENCOUNTER — Encounter: Payer: Self-pay | Admitting: Family Medicine

## 2021-02-28 ENCOUNTER — Ambulatory Visit (INDEPENDENT_AMBULATORY_CARE_PROVIDER_SITE_OTHER): Payer: Self-pay | Admitting: Family Medicine

## 2021-02-28 ENCOUNTER — Other Ambulatory Visit: Payer: Self-pay

## 2021-02-28 VITALS — BP 141/77 | HR 78 | Temp 97.5°F | Ht 68.7 in | Wt 253.0 lb

## 2021-02-28 DIAGNOSIS — E1159 Type 2 diabetes mellitus with other circulatory complications: Secondary | ICD-10-CM

## 2021-02-28 DIAGNOSIS — I152 Hypertension secondary to endocrine disorders: Secondary | ICD-10-CM

## 2021-02-28 DIAGNOSIS — E1121 Type 2 diabetes mellitus with diabetic nephropathy: Secondary | ICD-10-CM

## 2021-02-28 DIAGNOSIS — Z794 Long term (current) use of insulin: Secondary | ICD-10-CM

## 2021-02-28 DIAGNOSIS — E782 Mixed hyperlipidemia: Secondary | ICD-10-CM

## 2021-02-28 LAB — POCT GLYCOSYLATED HEMOGLOBIN (HGB A1C): Hemoglobin A1C: 9.6 % — AB (ref 4.0–5.6)

## 2021-02-28 NOTE — Assessment & Plan Note (Signed)
Mild elevation in BP.  Discussed getting back on track with diet and incorporation of exercise to help with BP control.  Continue current medication.

## 2021-02-28 NOTE — Assessment & Plan Note (Signed)
He is tolerating crestor fine.  He would like to wait until insurance becomes effective before rechecking this.

## 2021-02-28 NOTE — Assessment & Plan Note (Signed)
Interval improvement in diabetes but remains uncontrolled. Continue current medications and work on better adherence to low carb/diabetic diet.

## 2021-02-28 NOTE — Progress Notes (Signed)
Edward Riley - 55 y.o. male MRN [id number]  Date of birth: 04-20-1966  Subjective Chief Complaint  Patient presents with  . Diabetes    HPI Edward Riley is a 55 y.o. male here today for follow up visit.  He reports that he is doing well.    He is still not checking blood sugars.  He does feel like his a1c should be better today.  He plans to continue to work on dietary and lifestyle change   He recently started a new job and is not currently uninsured but has enough medication until his new insurance becomes effective.  He has stopped taking insulin for now due to DOT requirements.  He would like to try another GLP-1 once insurance becomes effective.   He is taking lisinopril, hctz and amlodipine for management of HTN.  BP looked fine at recent DOT physical.  He has not had chest pain, shortness of breath, palpitations, headache or vision changes.   He has restarted crestor, tolerating well.   ROS:  A comprehensive ROS was completed and negative except as noted per HPI    No Known Allergies  Past Medical History:  Diagnosis Date  . Diabetes mellitus without complication ([facility])   . Hyperlipidemia   . Hypertension   . Obese   . Retinal hemorrhage, both eyes   . Sessile colonic polyp 02/04/2019   Colonoscopy February 2020.    History reviewed. No pertinent surgical history.  Social History   Socioeconomic History  . Marital status: Single    Spouse name: Not on file  . Number of children: Not on file  . Years of education: 38  . Highest education level: Not on file  Occupational History  . 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 Smoking status: Never Smoker  . Smokeless tobacco: Never Used  Substance and Sexual Activity  . Alcohol use: Yes    Comment: Rarely  . Drug use: No  . Sexual activity: Yes  Other Topics Concern  . Not on file  Social History Narrative   Marital Status: Single     Children: None   Pets: None    Living Situation: Lives alone   Occupation: Truck Sports administrator)     Education:  12 th Grade    Tobacco Use/Exposure:  None    Alcohol Use:  Rarely   Drug Use:  None   Diet:  Regular   Exercise:  Very Active at work    Hobbies:  Teacher, early years/pre                Social Determinants of Corporate investment banker Strain: Not on file  Food Insecurity: Not on file  Transportation Needs: Not on file  Physical Activity: Not on file  Stress: Not on file  Social Connections: Not on file    Family History  Problem Relation Age of Onset  . Arthritis Mother   . Hyperlipidemia Mother   . Hypertension Mother   . Cancer Father 60       Prostate (Metastatic) Lung, Esophagus, Stomach  . Alcohol abuse Father   . Kidney disease Brother   . Hypertension Brother   . Cancer Paternal Aunt        Breast Cancer  . Alzheimer's disease Maternal Grandmother   . Heart disease Paternal Grandmother   . Diabetes Brother   . Alcohol abuse Maternal Uncle  Health Maintenance  Topic Date Due  . Hepatitis C Screening  Never done  . HIV Screening  Never done  . OPHTHALMOLOGY EXAM  12/21/2019  . COVID-19 Vaccine (2 - Booster for [employer] series) 05/01/2020  . FOOT EXAM  09/18/2020  . INFLUENZA VACCINE  06/27/2021  . HEMOGLOBIN A1C  08/30/2021  . TETANUS/TDAP  11/17/2026  . COLONOSCOPY (Pts 45-57yrs Insurance coverage will need to be confirmed)  01/24/2029  . PNEUMOCOCCAL POLYSACCHARIDE VACCINE AGE 90-64 HIGH RISK  Completed  . HPV VACCINES  Aged Out     ----------------------------------------------------------------------------------------------------------------------------------------------------------------------------------------------------------------- Physical Exam BP (!) 141/77 (BP Location: Left Arm, Patient Position: Sitting, Cuff Size: Large)   Pulse 78   Temp (!) 97.5 F (36.4 C)   Ht 5' 8.7" (1.745 m)   Wt 253 lb (114.8 kg)   SpO2  99%   BMI 37.69 kg/m   Physical Exam Constitutional:      Appearance: Normal appearance.  HENT:     Head: Normocephalic and atraumatic.  Cardiovascular:     Rate and Rhythm: Normal rate and regular rhythm.  Pulmonary:     Effort: Pulmonary effort is normal.     Breath sounds: Normal breath sounds.  Neurological:     General: No focal deficit present.     Mental Status: He is alert.  Psychiatric:        Mood and Affect: Mood normal.        Behavior: Behavior normal.     ------------------------------------------------------------------------------------------------------------------------------------------------------------------------------------------------------------------- Assessment and Plan  Hypertension associated with diabetes ([facility]) Mild elevation in BP.  Discussed getting back on track with diet and incorporation of exercise to help with BP control.  Continue current medication.    Type 2 diabetes mellitus with diabetic nephropathy Interval improvement in diabetes but remains uncontrolled. Continue current medications and work on better adherence to low carb/diabetic diet.    Hyperlipidemia He is tolerating crestor fine.  He would like to wait until insurance becomes effective before rechecking this.     No orders of the defined types were placed in this encounter.   Return in about 3 months (around 05/30/2021) for HTN/DM.    This visit occurred during the SARS-CoV-2 public health emergency.  Safety protocols were in place, including screening questions prior to the visit, additional usage of staff PPE, and extensive cleaning of exam room while observing appropriate contact time as indicated for disinfecting solutions.

## 2021-02-28 NOTE — Patient Instructions (Signed)
Great to see you! Work on dietary changes to get blood sugars under better control.  See me again in 3 months.

## 2021-05-26 ENCOUNTER — Encounter: Payer: Self-pay | Admitting: Family Medicine

## 2021-05-26 MED ORDER — INVOKAMET 150-1000 MG PO TABS: 1.0000 | ORAL_TABLET | Freq: Two times a day (BID) | ORAL | 1 refills | Status: DC

## 2021-05-27 ENCOUNTER — Other Ambulatory Visit: Payer: Self-pay | Admitting: Family Medicine

## 2021-05-27 DIAGNOSIS — I10 Essential (primary) hypertension: Secondary | ICD-10-CM

## 2021-06-02 ENCOUNTER — Encounter: Payer: Self-pay | Admitting: Family Medicine

## 2021-06-06 ENCOUNTER — Ambulatory Visit: Payer: Self-pay | Admitting: Family Medicine

## 2021-06-07 ENCOUNTER — Other Ambulatory Visit: Payer: Self-pay | Admitting: Family Medicine

## 2021-06-07 MED ORDER — XIGDUO XR 10-1000 MG PO TB24
1.0000 | ORAL_TABLET | Freq: Every day | ORAL | 1 refills | Status: DC
Start: 2021-06-07 — End: 2021-10-24

## 2021-06-07 NOTE — Progress Notes (Signed)
Insurance formulary change, need to change to Xigduo to replace invokamet.

## 2021-06-08 ENCOUNTER — Encounter: Payer: Self-pay | Admitting: Family Medicine

## 2021-06-10 ENCOUNTER — Encounter: Payer: Self-pay | Admitting: Family Medicine

## 2021-06-10 ENCOUNTER — Telehealth: Payer: Self-pay | Admitting: Family Medicine

## 2021-06-10 ENCOUNTER — Other Ambulatory Visit: Payer: Self-pay

## 2021-06-10 DIAGNOSIS — E119 Type 2 diabetes mellitus without complications: Secondary | ICD-10-CM

## 2021-06-10 MED ORDER — PIOGLITAZONE HCL 45 MG PO TABS: 45.0000 mg | ORAL_TABLET | Freq: Every day | ORAL | 3 refills | Status: DC

## 2021-06-10 NOTE — Telephone Encounter (Signed)
Pt is requesting that Dr.Matthews give him a call today when he gets a chance. He states he is trying to get a resolve to his medicine and insurance. I informed patient that I could send the message to his assistant and the Dr. And someone would reach out to him

## 2021-06-10 NOTE — Telephone Encounter (Signed)
Called patient. Dr Ashley Royalty sent in a different medication.

## 2021-06-10 NOTE — Progress Notes (Signed)
Spoke to [place]. He has picked up the medication. Cost $30

## 2021-06-10 NOTE — Telephone Encounter (Signed)
Pt has been contacted by Nolen Mu, CMA.

## 2021-06-16 ENCOUNTER — Encounter: Payer: Self-pay | Admitting: Family Medicine

## 2021-06-27 ENCOUNTER — Ambulatory Visit: Payer: Self-pay | Admitting: Family Medicine

## 2021-06-30 ENCOUNTER — Other Ambulatory Visit: Payer: Self-pay

## 2021-06-30 MED ORDER — HYDROCHLOROTHIAZIDE 25 MG PO TABS: ORAL_TABLET | ORAL | 0 refills | Status: DC

## 2021-06-30 NOTE — Telephone Encounter (Signed)
Please call and schedule patient for hypertension follow-up appt per Dr. Ashley Royalty. Due 05/30/21  Sending 30 day refill

## 2021-07-01 NOTE — Telephone Encounter (Signed)
Attempted to call 3x, phone number just rings then cuts off. Unable to inform pt of needing an appt

## 2021-07-04 ENCOUNTER — Other Ambulatory Visit: Payer: Self-pay

## 2021-07-04 ENCOUNTER — Ambulatory Visit (INDEPENDENT_AMBULATORY_CARE_PROVIDER_SITE_OTHER): Payer: No Typology Code available for payment source | Admitting: Family Medicine

## 2021-07-04 ENCOUNTER — Encounter: Payer: Self-pay | Admitting: Family Medicine

## 2021-07-04 VITALS — BP 137/73 | HR 76 | Temp 97.9°F | Ht 68.0 in | Wt 258.0 lb

## 2021-07-04 DIAGNOSIS — Z23 Encounter for immunization: Secondary | ICD-10-CM

## 2021-07-04 DIAGNOSIS — N420 Calculus of prostate: Secondary | ICD-10-CM | POA: Diagnosis not present

## 2021-07-04 DIAGNOSIS — E785 Hyperlipidemia, unspecified: Secondary | ICD-10-CM

## 2021-07-04 DIAGNOSIS — E1159 Type 2 diabetes mellitus with other circulatory complications: Secondary | ICD-10-CM

## 2021-07-04 DIAGNOSIS — E113553 Type 2 diabetes mellitus with stable proliferative diabetic retinopathy, bilateral: Secondary | ICD-10-CM

## 2021-07-04 DIAGNOSIS — E1121 Type 2 diabetes mellitus with diabetic nephropathy: Secondary | ICD-10-CM | POA: Diagnosis not present

## 2021-07-04 DIAGNOSIS — E113299 Type 2 diabetes mellitus with mild nonproliferative diabetic retinopathy without macular edema, unspecified eye: Secondary | ICD-10-CM

## 2021-07-04 DIAGNOSIS — Z8042 Family history of malignant neoplasm of prostate: Secondary | ICD-10-CM

## 2021-07-04 DIAGNOSIS — E1169 Type 2 diabetes mellitus with other specified complication: Secondary | ICD-10-CM

## 2021-07-04 DIAGNOSIS — Z794 Long term (current) use of insulin: Secondary | ICD-10-CM

## 2021-07-04 DIAGNOSIS — E119 Type 2 diabetes mellitus without complications: Secondary | ICD-10-CM

## 2021-07-04 DIAGNOSIS — I10 Essential (primary) hypertension: Secondary | ICD-10-CM

## 2021-07-04 DIAGNOSIS — I152 Hypertension secondary to endocrine disorders: Secondary | ICD-10-CM

## 2021-07-04 MED ORDER — TADALAFIL 20 MG PO TABS: 20.0000 mg | ORAL_TABLET | ORAL | 1 refills | Status: DC

## 2021-07-04 MED ORDER — AMLODIPINE BESYLATE 10 MG PO TABS: ORAL_TABLET | ORAL | 3 refills | Status: DC

## 2021-07-04 MED ORDER — HYDROCHLOROTHIAZIDE 25 MG PO TABS: ORAL_TABLET | ORAL | 3 refills | Status: DC

## 2021-07-04 MED ORDER — ROSUVASTATIN CALCIUM 20 MG PO TABS: 20.0000 mg | ORAL_TABLET | Freq: Every day | ORAL | 3 refills | Status: DC

## 2021-07-04 MED ORDER — TOUJEO SOLOSTAR 300 UNIT/ML ~~LOC~~ SOPN: 30.0000 [IU] | PEN_INJECTOR | Freq: Every day | SUBCUTANEOUS | 2 refills | Status: DC

## 2021-07-04 MED ORDER — LISINOPRIL 20 MG PO TABS: 20.0000 mg | ORAL_TABLET | Freq: Every morning | ORAL | 3 refills | Status: DC

## 2021-07-04 MED ORDER — GLUCOSE BLOOD VI STRP: ORAL_STRIP | 12 refills | Status: AC

## 2021-07-04 NOTE — Assessment & Plan Note (Signed)
Blood pressure remains well controlled at this time.  I recommend continuation of current antihypertensive medications.  Encouraged to follow a low-sodium diet.  Updating labs today.

## 2021-07-04 NOTE — Assessment & Plan Note (Signed)
Updating labs today including A1c.  I encouraged him to follow a low carbohydrate diet with regular exercise to help with blood sugar control.  Also encouraged him to check his blood sugars at home.  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  I discussed with him that he will need to make significant changes to improve blood sugars.  Continue current medications.

## 2021-07-04 NOTE — Patient Instructions (Signed)
Great to see you! Referral entered for retina specialist.  See me again in about 4 months.

## 2021-07-04 NOTE — Assessment & Plan Note (Signed)
He is doing well with Crestor, update lipid panel today.

## 2021-07-04 NOTE — Progress Notes (Signed)
Edward Riley - 55 y.o. male MRN [id number]  Date of birth: 1966/08/16  Subjective Chief Complaint  Patient presents with   Hyperlipidemia   Hypertension   Diabetes    HPI Edward Riley is a 55 year old male here today for follow-up visit.  He reports that overall he is doing well and has no additional concerns today.  He is compliant with medications for management of diabetes however he does not monitor his blood sugars at home.  He has made some improvements to his diet but continues to follow a fairly carb heavy diet.  He denies any hypoglycemia with current medications.  He did see ophthalmology recently and was noted to have some retinopathy.  He has had laser treatment for this before however the retinal specialist he was referred to told him that he had an outstanding balance.  He is requesting referral to another retina specialist.  Tolerating Crestor well for associated hyperlipidemia.  His hypertension is doing well with amlodipine, hydrochlorothiazide and lisinopril.  He denies any side effects related to these medications.   ROS:  A comprehensive ROS was completed and negative except as noted per HPI    No Known Allergies  Past Medical History:  Diagnosis Date   Diabetes mellitus without complication ([facility])    Hyperlipidemia    Hypertension    Obese    Retinal hemorrhage, both eyes    Sessile colonic polyp 02/04/2019   Colonoscopy February 2020.    History reviewed. No pertinent surgical history.  Social History   Socioeconomic History   Marital status: Single    Spouse name: Not on file   Number of children: Not on file   Years of education: 12   Highest education level: Not on file  Occupational History   Occupation: TRUCK DRIVER     Employer: other - Physiological scientist    Comment: [employer]  Tobacco Use   Smoking status: Never   Smokeless tobacco: Never  Substance and Sexual Activity   Alcohol use: Yes    Comment: Rarely   Drug use: No   Sexual  activity: Yes  Other Topics Concern   Not on file  Social History Narrative   Marital Status: Single    Children: None   Pets: None    Living Situation: Lives alone   Occupation: Naval architect Research officer, trade union)     Education:  12 th Grade    Tobacco Use/Exposure:  None    Alcohol Use:  Rarely   Drug Use:  None   Diet:  Regular   Exercise:  Very Active at work    Hobbies:  Teacher, early years/pre                Social Determinants of Corporate investment banker Strain: Not on file  Food Insecurity: Not on file  Transportation Needs: Not on file  Physical Activity: Not on file  Stress: Not on file  Social Connections: Not on file    Family History  Problem Relation Age of Onset   Arthritis Mother    Hyperlipidemia Mother    Hypertension Mother    Cancer Father 34       Prostate (Metastatic) Lung, Esophagus, Stomach   Alcohol abuse Father    Kidney disease Brother    Hypertension Brother    Cancer Paternal Aunt        Breast Cancer   Alzheimer's disease Maternal Grandmother    Heart disease Paternal Grandmother    Diabetes Brother  Alcohol abuse Maternal Uncle     Health Maintenance  Topic Date Due   OPHTHALMOLOGY EXAM  12/21/2019   INFLUENZA VACCINE  06/27/2021   COVID-19 Vaccine (2 - Booster for [employer] series) 07/20/2021 (Originally 05/01/2020)   Hepatitis C Screening  07/04/2022 (Originally 04/04/1986)   HIV Screening  07/04/2022 (Originally 04/05/1983)   Zoster Vaccines- Shingrix (2 of 2) 08/29/2021   HEMOGLOBIN A1C  08/30/2021   FOOT EXAM  07/04/2022   TETANUS/TDAP  11/17/2026   COLONOSCOPY (Pts 45-56yrs Insurance coverage will need to be confirmed)  01/24/2029   PNEUMOCOCCAL POLYSACCHARIDE VACCINE AGE 18-64 HIGH RISK  Completed   Pneumococcal Vaccine 78-40 Years old  Aged Out   HPV VACCINES  Aged Out      ----------------------------------------------------------------------------------------------------------------------------------------------------------------------------------------------------------------- Physical Exam BP 137/73 (BP Location: Left Arm, Patient Position: Sitting, Cuff Size: Large)   Pulse 76   Temp 97.9 F (36.6 C)   Ht 5\' 8"  (1.727 m)   Wt 258 lb (117 kg)   SpO2 100%   BMI 39.23 kg/m   Physical Exam Constitutional:      Appearance: Normal appearance.  Eyes:     General: No scleral icterus. Cardiovascular:     Rate and Rhythm: Normal rate and regular rhythm.  Pulmonary:     Effort: Pulmonary effort is normal.     Breath sounds: Normal breath sounds.  Musculoskeletal:     Cervical back: Normal range of motion and neck supple.  Neurological:     General: No focal deficit present.     Mental Status: He is alert.  Psychiatric:        Mood and Affect: Mood normal.        Behavior: Behavior normal.    ------------------------------------------------------------------------------------------------------------------------------------------------------------------------------------------------------------------- Assessment and Plan  Type 2 diabetes mellitus with diabetic nephropathy Updating labs today including A1c.  I encouraged him to follow a low carbohydrate diet with regular exercise to help with blood sugar control.  Also encouraged him to check his blood sugars at home.  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  I discussed with him that he will need to make significant changes to improve blood sugars.  Continue current medications.  Hyperlipidemia associated with type 2 diabetes mellitus ([facility]) He is doing well with Crestor, update lipid panel today.  Diabetes with retinopathy ([facility]) Diabetic retinopathy noted on recent eye exam.  Referral placed to [facility].  Hypertension associated with diabetes ([facility]) Blood  pressure remains well controlled at this time.  I recommend continuation of current antihypertensive medications.  Encouraged to follow a low-sodium diet.  Updating labs today.   Meds ordered this encounter  Medications   amLODipine (NORVASC) 10 MG tablet    Sig: TAKE 1 TABLET(10 MG) BY MOUTH DAILY    Dispense:  90 tablet    Refill:  3    **Patient requests 90 days supply**   hydrochlorothiazide (HYDRODIURIL) 25 MG tablet    Sig: TAKE 1 TABLET(25 MG) BY MOUTH DAILY    Dispense:  90 tablet    Refill:  3   insulin glargine, 1 Unit Dial, (TOUJEO SOLOSTAR) 300 UNIT/ML Solostar Pen    Sig: Inject 30 Units into the skin daily.    Dispense:  6 mL    Refill:  2   rosuvastatin (CRESTOR) 20 MG tablet    Sig: Take 1 tablet (20 mg total) by mouth daily.    Dispense:  90 tablet    Refill:  3  tadalafil (CIALIS) 20 MG tablet    Sig: Take 1 tablet (20 mg total) by mouth every other day.    Dispense:  90 tablet    Refill:  1   lisinopril (ZESTRIL) 20 MG tablet    Sig: Take 1 tablet (20 mg total) by mouth every morning.    Dispense:  90 tablet    Refill:  3   glucose blood (ONE TOUCH ULTRA TEST) test strip    Sig: Use as instructed    Dispense:  100 each    Refill:  12    Check blood sugar 4 times daily. Diagnosis DM    Return in about 4 months (around 11/03/2021) for HTN/DM.    This visit occurred during the SARS-CoV-2 public health emergency.  Safety protocols were in place, including screening questions prior to the visit, additional usage of staff PPE, and extensive cleaning of exam room while observing appropriate contact time as indicated for disinfecting solutions.

## 2021-07-04 NOTE — Assessment & Plan Note (Signed)
Diabetic retinopathy noted on recent eye exam.  Referral placed to [facility].

## 2021-07-05 LAB — CBC WITH DIFFERENTIAL/PLATELET
Absolute Monocytes: 671 cells/uL (ref 200–950)
Basophils Absolute: 52 cells/uL (ref 0–200)
Basophils Relative: 0.6 %
Eosinophils Absolute: 267 cells/uL (ref 15–500)
Eosinophils Relative: 3.1 %
HCT: 46.9 % (ref 38.5–50.0)
Hemoglobin: 15.6 g/dL (ref 13.2–17.1)
Lymphs Abs: 1350 cells/uL (ref 850–3900)
MCH: 30.8 pg (ref 27.0–33.0)
MCHC: 33.3 g/dL (ref 32.0–36.0)
MCV: 92.5 fL (ref 80.0–100.0)
MPV: 11.8 fL (ref 7.5–12.5)
Monocytes Relative: 7.8 %
Neutro Abs: 6261 cells/uL (ref 1500–7800)
Neutrophils Relative %: 72.8 %
Platelets: 217 10*3/uL (ref 140–400)
RBC: 5.07 10*6/uL (ref 4.20–5.80)
RDW: 12.3 % (ref 11.0–15.0)
Total Lymphocyte: 15.7 %
WBC: 8.6 10*3/uL (ref 3.8–10.8)

## 2021-07-05 LAB — COMPLETE METABOLIC PANEL WITH GFR
AG Ratio: 1.6 (calc) (ref 1.0–2.5)
ALT: 15 U/L (ref 9–46)
AST: 15 U/L (ref 10–35)
Albumin: 4.7 g/dL (ref 3.6–5.1)
Alkaline phosphatase (APISO): 62 U/L (ref 35–144)
BUN/Creatinine Ratio: 25 (calc) — ABNORMAL HIGH (ref 6–22)
BUN: 31 mg/dL — ABNORMAL HIGH (ref 7–25)
CO2: 30 mmol/L (ref 20–32)
Calcium: 10.4 mg/dL — ABNORMAL HIGH (ref 8.6–10.3)
Chloride: 100 mmol/L (ref 98–110)
Creat: 1.26 mg/dL (ref 0.70–1.30)
Globulin: 2.9 g/dL (calc) (ref 1.9–3.7)
Glucose, Bld: 205 mg/dL — ABNORMAL HIGH (ref 65–99)
Potassium: 4.9 mmol/L (ref 3.5–5.3)
Sodium: 138 mmol/L (ref 135–146)
Total Bilirubin: 0.5 mg/dL (ref 0.2–1.2)
Total Protein: 7.6 g/dL (ref 6.1–8.1)
eGFR: 68 mL/min/{1.73_m2} (ref >=60)

## 2021-07-05 LAB — PSA: PSA: 0.29 ng/mL (ref <=4.00)

## 2021-07-05 LAB — HEMOGLOBIN A1C
Hgb A1c MFr Bld: 9.5 % of total Hgb — ABNORMAL HIGH (ref <5.7)
Mean Plasma Glucose: 226 mg/dL
eAG (mmol/L): 12.5 mmol/L

## 2021-07-05 LAB — LIPID PANEL W/REFLEX DIRECT LDL
Cholesterol: 176 mg/dL (ref <200)
HDL: 40 mg/dL (ref >=40)
LDL Cholesterol (Calc): 111 mg/dL (calc) — ABNORMAL HIGH
Non-HDL Cholesterol (Calc): 136 mg/dL (calc) — ABNORMAL HIGH (ref <130)
Total CHOL/HDL Ratio: 4.4 (calc) (ref <5.0)
Triglycerides: 139 mg/dL (ref <150)

## 2021-07-08 ENCOUNTER — Other Ambulatory Visit: Payer: Self-pay | Admitting: Family Medicine

## 2021-07-08 MED ORDER — RYBELSUS 7 MG PO TABS: 7.0000 mg | ORAL_TABLET | Freq: Every day | ORAL | 3 refills | Status: DC

## 2021-07-08 MED ORDER — RYBELSUS 3 MG PO TABS: 3.0000 mg | ORAL_TABLET | Freq: Every day | ORAL | 0 refills | Status: DC

## 2021-07-25 ENCOUNTER — Encounter (INDEPENDENT_AMBULATORY_CARE_PROVIDER_SITE_OTHER): Payer: No Typology Code available for payment source | Admitting: Ophthalmology

## 2021-07-25 DIAGNOSIS — H3581 Retinal edema: Secondary | ICD-10-CM

## 2021-08-04 ENCOUNTER — Telehealth: Payer: Self-pay

## 2021-08-04 NOTE — Telephone Encounter (Signed)
Medication: Semaglutide (RYBELSUS) 3 MG TABS Prior authorization determination received Medication has been approved Approval dates: 07/13/2021-07/13/2022  Patient aware via: MyChart Pharmacy aware: Yes Provider aware via this encounter

## 2021-08-11 NOTE — Progress Notes (Addendum)
[facility] Note  08/15/2021     CHIEF COMPLAINT Patient presents for Diabetic Eye Exam   HISTORY OF PRESENT ILLNESS: Edward Riley is a 55 y.o. male who presents to the clinic today for:   HPI     Diabetic Eye Exam   Vision is stable.  Associated Symptoms Negative for Flashes, Floaters, Distortion, Blind Spot, Pain, Redness, Photophobia, Glare, Trauma, Scalp Tenderness, Jaw Claudication, Shoulder/Hip pain, Fever, Weight Loss and Fatigue.  Diabetes characteristics include Type 2.  This started 30 years ago.  Blood sugar level is uncontrolled.  Last Blood Glucose: Hasn't checked.  Last A1C 8.  I, the attending physician,  performed the HPI with the patient and updated documentation appropriately.        Comments   Patient states vision seems good OU. Patient's primary care referred for diabetic retinal evaluation. History of diabetic retinopathy. Has had laser OU for diabetes in the past. Laser was several years ago in [place].       Last edited by Bernarda Caffey, MD on 08/15/2021 10:44 PM.    Pt is here on the referral of Dr. Luetta Nutting for concern of NPDR, pt states he goes to [facility] for routine eye exams, he states he has had laser in both eyes at a retina specialist in in [place] about 10-12 years ago  Referring physician: Luetta Nutting, [place] [address],  [place] [address]  HISTORICAL INFORMATION:   Selected notes from the [place] Referred by Dr. Luetta Nutting for diabetic eye exam LEE: [facility] Ocular Hx- PMH-    CURRENT MEDICATIONS: No current outpatient medications on file. (Ophthalmic Drugs)   No current facility-administered medications for this visit. (Ophthalmic Drugs)   Current Outpatient Medications (Other)  Medication Sig   AMBULATORY NON FORMULARY MEDICATION Bayer test strips and lancets. Use to check blood sugar twice a day. Dx: Type  Two Diabetes Mellitus   amLODipine (NORVASC) 10 MG tablet TAKE 1 TABLET(10 MG) BY MOUTH DAILY   BD PEN NEEDLE NANO U/F 32G X 4 MM MISC USE AS DIRECTED   Blood Glucose Monitoring Suppl (ONE TOUCH ULTRA 2) w/Device KIT Use to check blood sugar twice a day. Send strips and lancets, quantity 100 refills 3.  Dx: Type Two Diabetes Mellitus   Continuous Blood Gluc Sensor (FREESTYLE LIBRE SENSOR SYSTEM) MISC Use as directed. Change device every 2 weeks   Dapagliflozin-metFORMIN HCl ER (XIGDUO XR) 08-999 MG TB24 Take 1 tablet by mouth daily.   glucose blood (ONE TOUCH ULTRA TEST) test strip Use as instructed   hydrochlorothiazide (HYDRODIURIL) 25 MG tablet TAKE 1 TABLET(25 MG) BY MOUTH DAILY   insulin glargine, 1 Unit Dial, (TOUJEO SOLOSTAR) 300 UNIT/ML Solostar Pen Inject 30 Units into the skin daily.   Insulin Pen Needle (PEN NEEDLES) 31G X 8 MM MISC Use the pen needles for both the insulin and victoza pens daily   Lancet Device MISC Check blood sugar once or twice daily as needed.   lisinopril (ZESTRIL) 20 MG tablet Take 1 tablet (20 mg total) by mouth every morning.   pioglitazone (ACTOS) 45 MG tablet Take 1 tablet (45 mg total) by mouth daily.   rosuvastatin (CRESTOR) 20 MG tablet Take 1 tablet (20 mg total) by mouth daily.   Semaglutide (RYBELSUS) 7 MG TABS Take 7 mg by mouth daily. Start after completion of 30 days at 55m   tadalafil (CIALIS) 20 MG  tablet Take 1 tablet (20 mg total) by mouth every other day.   Semaglutide (RYBELSUS) 3 MG TABS Take 3 mg by mouth daily.   No current facility-administered medications for this visit. (Other)   REVIEW OF SYSTEMS: ROS   Positive for: Endocrine, Cardiovascular, Eyes Negative for: Constitutional, Gastrointestinal, Neurological, Skin, Genitourinary, Musculoskeletal, HENT, Respiratory, Psychiatric, Allergic/Imm, Heme/Lymph Last edited by Roselee Nova D, COT on 08/15/2021  7:45 AM.     ALLERGIES No Known Allergies  PAST MEDICAL HISTORY Past  Medical History:  Diagnosis Date   Diabetes mellitus without complication ([place])    Hyperlipidemia    Hypertension    Obese    Retinal hemorrhage, both eyes    Sessile colonic polyp 02/04/2019   Colonoscopy February 2020.   History reviewed. No pertinent surgical history.  FAMILY HISTORY Family History  Problem Relation Age of Onset   Arthritis Mother    Hyperlipidemia Mother    Hypertension Mother    Cancer Father 86       Prostate (Metastatic) Lung, Esophagus, Stomach   Alcohol abuse Father    Kidney disease Brother    Hypertension Brother    Cancer Paternal Aunt        Breast Cancer   Alzheimer's disease Maternal Grandmother    Heart disease Paternal Grandmother    Diabetes Brother    Alcohol abuse Maternal Uncle     SOCIAL HISTORY Social History   Tobacco Use   Smoking status: Never   Smokeless tobacco: Never  Substance Use Topics   Alcohol use: Yes    Comment: Rarely   Drug use: No         OPHTHALMIC EXAM:  Base Eye Exam     Visual Acuity (Snellen - Linear)       Right Left   Dist cc 20/40 -2 20/40   Dist ph cc 20/25 +2 20/25 -1         Tonometry (Tonopen, 7:58 AM)       Right Left   Pressure 16 14         Pupils       Dark Light Shape React APD   Right 3 2 Round Slow None   Left 4 3 Round Slow None         Visual Fields (Counting fingers)       Left Right    Full Full         Extraocular Movement       Right Left    Full, Ortho Full, Ortho         Neuro/Psych     Oriented x3: Yes   Mood/Affect: Normal         Dilation     Both eyes: 1.0% Mydriacyl, 2.5% Phenylephrine @ 7:58 AM           Slit Lamp and Fundus Exam     Slit Lamp Exam       Right Left   Lids/Lashes Dermatochalasis - upper lid Dermatochalasis - upper lid   Conjunctiva/Sclera nasal pingeucula nasal and temporal pinguecula   Cornea trace PEE trace PEE   Anterior Chamber Deep and quiet Deep and quiet   Iris Round and dilated, No NVI  Round and dilated, No NVI   Lens 2+ Nuclear sclerosis, 2+ Cortical cataract 2+ Nuclear sclerosis, 2+ Cortical cataract   Vitreous Mild Vitreous syneresis Mild Vitreous syneresis         Fundus Exam       Right Left  Disc Pink and Sharp, PPA, Compact mild tilt, Pink and Sharp, PPA   C/D Ratio 0.2 0.2   Macula Blunted foveal reflex, trace MA, focal exudates superior macula, +perifoveal cystic changes Good foveal reflex, Microaneurysms, Cystic changes temporal to fovea   Vessels mild attenuation, mild Copper wiring, mild AV crossing changes, ?NV temporal periphery mild attenuation, mild Copper wiring, mild AV crossing changes, trace focal fibrosis along distal IT arcade, ?NV temporal periphery   Periphery Attached, 360 PRP with room for fill in, pre-retinal heme temporal periphery    Attached, 360 PRP with room for fill in, pre-retinal heme temporal periphery              Refraction     Wearing Rx       Sphere Cylinder Axis   Right -2.50 +1.00 138   Left -2.25 +1.00 173         Manifest Refraction       Sphere Cylinder Axis Dist VA   Right -1.25 +1.00 138 20/25-2   Left -1.25 +1.00 173 20/25            IMAGING AND PROCEDURES  Imaging and Procedures for 08/15/2021  OCT, Retina - OU - Both Eyes       Right Eye Quality was good. Central Foveal Thickness: 508. Progression has no prior data. Findings include abnormal foveal contour, intraretinal fluid, no SRF, vitreomacular adhesion (+DME).   Left Eye Quality was good. Central Foveal Thickness: 348. Progression has no prior data. Findings include normal foveal contour, no SRF, intraretinal fluid, vitreomacular adhesion .   Notes *Images captured and stored on drive  Diagnosis / Impression:  +DME OU  Clinical management:  See below  Abbreviations: NFP - Normal foveal profile. CME - cystoid macular edema. PED - pigment epithelial detachment. IRF - intraretinal fluid. SRF - subretinal fluid. EZ - ellipsoid zone.  ERM - epiretinal membrane. ORA - outer retinal atrophy. ORT - outer retinal tubulation. SRHM - subretinal hyper-reflective material. IRHM - intraretinal hyper-reflective material      Fluorescein Angiography Optos (Transit OD)       Right Eye Progression has no prior data. Early phase findings include staining, vascular perfusion defect, retinal neovascularization, microaneurysm. Mid/Late phase findings include vascular perfusion defect, retinal neovascularization, leakage, staining (Leaking NV almost 360 midzone greatest IT quad ).   Left Eye Progression has no prior data. Early phase findings include leakage, retinal neovascularization, vascular perfusion defect, microaneurysm. Mid/Late phase findings include leakage, microaneurysm, staining, retinal neovascularization, vascular perfusion defect (Almost 360 leaking NV midzone, greatest superiorly).   Notes **Images stored on drive**  Impression: PDR OU  OD: Leaking NV almost 360 midzone greatest IT quad OS: Leaking NV almost 360 midzone greatest superiorly        Intravitreal Injection, Pharmacologic Agent - OD - Right Eye       Time Out 08/15/2021. 9:20 AM. Confirmed correct patient, procedure, site, and patient consented.   Anesthesia Topical anesthesia was used. Anesthetic medications included Lidocaine 2%, Proparacaine 0.5%.   Procedure Preparation included 5% betadine to ocular surface, eyelid speculum. A supplied (32g) needle was used.   Injection: 1.25 mg Bevacizumab 1.56m/0.05ml   Route: Intravitreal, Site: Right Eye   NDC: 50242-060-01, Lot:: [id number] Expiration date: 10/19/2021, Waste: 0.05 mL   Post-op Post injection exam found visual acuity of at least counting fingers. The patient tolerated the procedure well. There were no complications. The patient received written and verbal post procedure care education.  ASSESSMENT/PLAN:    ICD-10-CM   1. Proliferative diabetic retinopathy of both  eyes with macular edema associated with type 2 diabetes mellitus (HCC)  Q49.2010 Intravitreal Injection, Pharmacologic Agent - OD - Right Eye    Bevacizumab (AVASTIN) SOLN 1.25 mg    2. Retinal edema  H35.81 OCT, Retina - OU - Both Eyes    3. Essential hypertension  I10     4. Hypertensive retinopathy of both eyes  H35.033 Fluorescein Angiography Optos (Transit OD)    5. Combined forms of age-related cataract of both eyes  H25.813       1,2. 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, OU (OD > OS)  - pt with history of PRP laser OU in [place] ~10+ yrs ago -- pt can't remember provider - The incidence, risk factors for progression, natural history and treatment options for diabetic retinopathy were discussed with patient.   - The need for close monitoring of blood glucose, blood pressure, and serum lipids, avoiding cigarette or any type of tobacco, and the need for long term follow up was also discussed with patient. - exam shows scattered MA/IRH and 360 PRP, room for fill in OU - FA today (9.19.22) shows late-leaking MA, vascular nonperfusion, +NV OU -- will need PRP fill in OU - OCT shows diabetic macular edema, both eyes  - discussed findings and prognosis - recommend IVA OU, OD #1 first today, 9.19.22 - pt wishes to proceed with injection OD - RBA of procedure discussed, questions answered - informed consent obtained and signed - see procedure note - f/u Friday for IVA OS and review FA  3,4. Hypertensive retinopathy OU - discussed importance of tight BP control - monitor  5. Mixed Cataract OU - The symptoms of cataract, surgical options, and treatments and risks were discussed with patient. - discussed diagnosis and progression - not yet visually significant - monitor for now  Ophthalmic Meds Ordered this visit:  Meds ordered this encounter  Medications   Bevacizumab (AVASTIN) SOLN 1.25 mg      Return in about 4 days (around 08/19/2021).  There are no Patient  Instructions on file for this visit.    Explained the diagnoses, plan, and follow up with the patient and they expressed understanding.  Patient expressed understanding of the importance of proper follow up care.   This document serves as a record of services personally performed by Gardiner Sleeper, MD, PhD. It was created on their behalf by Roselee Nova, COMT. The creation of this record is the provider's dictation and/or activities during the visit.  Electronically signed by: Roselee Nova, COMT 08/15/21 10:46 PM  This document serves as a record of services personally performed by Gardiner Sleeper, MD, PhD. It was created on their behalf by San Jetty. Owens Shark, OA an ophthalmic technician. The creation of this record is the provider's dictation and/or activities during the visit.    Electronically signed by: San Jetty. Marguerita Merles 09.19.2022 10:46 PM  Gardiner Sleeper, M.D., Ph.D. Diseases & Surgery of the Retina and Vitreous [facility]  I have reviewed the above documentation for accuracy and completeness, and I agree with the above. Gardiner Sleeper, M.D., Ph.D. 08/15/21 10:46 PM  Abbreviations: M myopia (nearsighted); A astigmatism; H hyperopia (farsighted); P presbyopia; Mrx spectacle prescription;  CTL contact lenses; OD right eye; OS left eye; OU both eyes  XT exotropia; ET esotropia; PEK punctate epithelial keratitis; PEE punctate epithelial erosions; DES dry eye syndrome; MGD meibomian gland  dysfunction; ATs artificial tears; PFAT's preservative free artificial tears; [place] nuclear sclerotic cataract; PSC posterior subcapsular cataract; ERM epi-retinal membrane; PVD posterior vitreous detachment; RD retinal detachment; DM diabetes mellitus; DR diabetic retinopathy; NPDR non-proliferative diabetic retinopathy; PDR proliferative diabetic retinopathy; CSME clinically significant macular edema; DME diabetic macular edema; dbh dot blot hemorrhages; CWS cotton wool spot; POAG  primary open angle glaucoma; C/D cup-to-disc ratio; HVF humphrey visual field; GVF goldmann visual field; OCT optical coherence tomography; IOP intraocular pressure; BRVO Branch retinal vein occlusion; CRVO central retinal vein occlusion; CRAO central retinal artery occlusion; BRAO branch retinal artery occlusion; RT retinal tear; SB scleral buckle; PPV pars plana vitrectomy; VH Vitreous hemorrhage; PRP panretinal laser photocoagulation; IVK intravitreal kenalog; VMT vitreomacular traction; MH Macular hole;  NVD neovascularization of the disc; NVE neovascularization elsewhere; AREDS age related eye disease study; ARMD age related macular degeneration; POAG primary open angle glaucoma; EBMD epithelial/anterior basement membrane dystrophy; ACIOL anterior chamber intraocular lens; IOL intraocular lens; PCIOL posterior chamber intraocular lens; Phaco/IOL phacoemulsification with intraocular lens placement; [place] photorefractive keratectomy; LASIK laser assisted in situ keratomileusis; HTN hypertension; DM diabetes mellitus; COPD chronic obstructive pulmonary disease

## 2021-08-15 ENCOUNTER — Ambulatory Visit (INDEPENDENT_AMBULATORY_CARE_PROVIDER_SITE_OTHER): Payer: No Typology Code available for payment source | Admitting: Ophthalmology

## 2021-08-15 ENCOUNTER — Other Ambulatory Visit: Payer: Self-pay

## 2021-08-15 ENCOUNTER — Encounter (INDEPENDENT_AMBULATORY_CARE_PROVIDER_SITE_OTHER): Payer: Self-pay | Admitting: Ophthalmology

## 2021-08-15 DIAGNOSIS — I10 Essential (primary) hypertension: Secondary | ICD-10-CM

## 2021-08-15 DIAGNOSIS — H35033 Hypertensive retinopathy, bilateral: Secondary | ICD-10-CM | POA: Diagnosis not present

## 2021-08-15 DIAGNOSIS — H25813 Combined forms of age-related cataract, bilateral: Secondary | ICD-10-CM

## 2021-08-15 DIAGNOSIS — H3581 Retinal edema: Secondary | ICD-10-CM

## 2021-08-15 DIAGNOSIS — E113513 Type 2 diabetes mellitus with proliferative diabetic retinopathy with macular edema, bilateral: Secondary | ICD-10-CM

## 2021-08-15 MED ORDER — BEVACIZUMAB CHEMO INJECTION 1.25MG/0.05ML SYRINGE FOR KALEIDOSCOPE
1.2500 mg | INTRAVITREAL | Status: AC | PRN
  Administered 2021-08-15: 1.25 mg via INTRAVITREAL

## 2021-08-17 NOTE — Progress Notes (Signed)
[facility] Note  08/19/2021     CHIEF COMPLAINT Patient presents for Retina Follow Up   HISTORY OF PRESENT ILLNESS: Edward Riley is a 55 y.o. male who presents to the clinic today for:   HPI     Retina Follow Up   Patient presents with  Diabetic Retinopathy.  In both eyes.  This started weeks ago.  Severity is moderate.  Duration of weeks.  Since onset it is stable.  I, the attending physician,  performed the HPI with the patient and updated documentation appropriately.        Comments   Pt states vision is the same OU.  Pt denies eye pain or discomfort and denies any new or worsening floaters or fol OU.      Last edited by Bernarda Caffey, MD on 08/19/2021  2:01 PM.    Pt here for IVA OS  Referring physician: Luetta Nutting, DO [address] [place],  [place] [address]  HISTORICAL INFORMATION:   Selected notes from the MEDICAL RECORD NUMBER Referred by Dr. Luetta Nutting for diabetic eye exam LEE: [facility] Ocular Hx- PMH-    CURRENT MEDICATIONS: No current outpatient medications on file. (Ophthalmic Drugs)   No current facility-administered medications for this visit. (Ophthalmic Drugs)   Current Outpatient Medications (Other)  Medication Sig   AMBULATORY NON FORMULARY MEDICATION Bayer test strips and lancets. Use to check blood sugar twice a day. Dx: Type Two Diabetes Mellitus   amLODipine (NORVASC) 10 MG tablet TAKE 1 TABLET(10 MG) BY MOUTH DAILY   BD PEN NEEDLE NANO U/F 32G X 4 MM MISC USE AS DIRECTED   Blood Glucose Monitoring Suppl (ONE TOUCH ULTRA 2) w/Device KIT Use to check blood sugar twice a day. Send strips and lancets, quantity 100 refills 3.  Dx: Type Two Diabetes Mellitus   Continuous Blood Gluc Sensor (FREESTYLE LIBRE SENSOR SYSTEM) MISC Use as directed. Change device every 2 weeks   Dapagliflozin-metFORMIN HCl ER (XIGDUO XR) 08-999 MG TB24 Take 1 tablet by mouth daily.    glucose blood (ONE TOUCH ULTRA TEST) test strip Use as instructed   hydrochlorothiazide (HYDRODIURIL) 25 MG tablet TAKE 1 TABLET(25 MG) BY MOUTH DAILY   insulin glargine, 1 Unit Dial, (TOUJEO SOLOSTAR) 300 UNIT/ML Solostar Pen Inject 30 Units into the skin daily.   Insulin Pen Needle (PEN NEEDLES) 31G X 8 MM MISC Use the pen needles for both the insulin and victoza pens daily   Lancet Device MISC Check blood sugar once or twice daily as needed.   lisinopril (ZESTRIL) 20 MG tablet Take 1 tablet (20 mg total) by mouth every morning.   pioglitazone (ACTOS) 45 MG tablet Take 1 tablet (45 mg total) by mouth daily.   rosuvastatin (CRESTOR) 20 MG tablet Take 1 tablet (20 mg total) by mouth daily.   Semaglutide (RYBELSUS) 3 MG TABS Take 3 mg by mouth daily.   Semaglutide (RYBELSUS) 7 MG TABS Take 7 mg by mouth daily. Start after completion of 30 days at 5m   tadalafil (CIALIS) 20 MG tablet Take 1 tablet (20 mg total) by mouth every other day.   No current facility-administered medications for this visit. (Other)   REVIEW OF SYSTEMS: ROS   Positive for: Endocrine, Cardiovascular, Eyes Negative for: Constitutional, Gastrointestinal, Neurological, Skin, Genitourinary, Musculoskeletal, HENT, Respiratory, Psychiatric, Allergic/Imm, Heme/Lymph Last edited by EDoneen Poissonon 08/19/2021  1:16 PM.      ALLERGIES No  Known Allergies  PAST MEDICAL HISTORY Past Medical History:  Diagnosis Date   Diabetes mellitus without complication ([place])    Hyperlipidemia    Hypertension    Obese    Retinal hemorrhage, both eyes    Sessile colonic polyp 02/04/2019   Colonoscopy February 2020.   History reviewed. No pertinent surgical history.  FAMILY HISTORY Family History  Problem Relation Age of Onset   Arthritis Mother    Hyperlipidemia Mother    Hypertension Mother    Cancer Father 15       Prostate (Metastatic) Lung, Esophagus, Stomach   Alcohol abuse Father    Kidney disease Brother     Hypertension Brother    Cancer Paternal Aunt        Breast Cancer   Alzheimer's disease Maternal Grandmother    Heart disease Paternal Grandmother    Diabetes Brother    Alcohol abuse Maternal Uncle     SOCIAL HISTORY Social History   Tobacco Use   Smoking status: Never   Smokeless tobacco: Never  Substance Use Topics   Alcohol use: Yes    Comment: Rarely   Drug use: No         OPHTHALMIC EXAM:  Base Eye Exam     Visual Acuity (Snellen - Linear)       Right Left   Dist cc 20/40 -2 20/30 -2   Dist ph cc 20/30 -1 20/25 -2    Correction: Glasses         Tonometry (Tonopen, 1:20 PM)       Right Left   Pressure 13 10         Pupils       Dark Light Shape React APD   Right 2 1 Round Minimal 0   Left 2 1 Round Minimal 0         Visual Fields       Left Right    Full Full         Extraocular Movement       Right Left    Full Full         Neuro/Psych     Oriented x3: Yes   Mood/Affect: Normal         Dilation     Left eye: 1.0% Mydriacyl, 2.5% Phenylephrine @ 1:20 PM           Slit Lamp and Fundus Exam     Slit Lamp Exam       Right Left   Lids/Lashes Dermatochalasis - upper lid Dermatochalasis - upper lid   Conjunctiva/Sclera nasal pingeucula nasal and temporal pinguecula   Cornea trace PEE trace PEE   Anterior Chamber Deep and quiet Deep and quiet   Iris Round and dilated, No NVI Round and dilated, No NVI   Lens 2+ Nuclear sclerosis, 2+ Cortical cataract 2+ Nuclear sclerosis, 2+ Cortical cataract   Vitreous Mild Vitreous syneresis Mild Vitreous syneresis         Fundus Exam       Right Left   Disc Pink and Sharp, PPA, Compact mild tilt, Pink and Sharp, PPA   C/D Ratio 0.2 0.2   Macula Blunted foveal reflex, trace MA, focal exudates superior macula, +perifoveal cystic changes -- slightly improved Good foveal reflex, Microaneurysms, Cystic changes temporal to fovea   Vessels mild attenuation, mild Copper wiring,  mild AV crossing changes, ?NV temporal periphery mild attenuation, mild Copper wiring, mild AV crossing changes, trace focal fibrosis along distal IT  arcade, ?NV temporal periphery   Periphery Attached, 360 PRP with room for fill in, pre-retinal heme temporal periphery    Attached, 360 PRP with room for fill in, pre-retinal heme temporal periphery              Refraction     Wearing Rx       Sphere Cylinder Axis   Right -2.50 +1.00 138   Left -2.25 +1.00 173            IMAGING AND PROCEDURES  Imaging and Procedures for 08/19/2021  OCT, Retina - OU - Both Eyes       Right Eye Quality was good. Central Foveal Thickness: 436. Progression has improved. Findings include abnormal foveal contour, intraretinal fluid, no SRF, vitreomacular adhesion (Interval improvement in IRF).   Left Eye Quality was good. Central Foveal Thickness: 352. Progression has been stable. Findings include normal foveal contour, no SRF, intraretinal fluid, vitreomacular adhesion .   Notes *Images captured and stored on drive  Diagnosis / Impression:  OD: Interval improvement in IRF OS: +DME OU Clinical management:  See below  Abbreviations: NFP - Normal foveal profile. CME - cystoid macular edema. PED - pigment epithelial detachment. IRF - intraretinal fluid. SRF - subretinal fluid. EZ - ellipsoid zone. ERM - epiretinal membrane. ORA - outer retinal atrophy. ORT - outer retinal tubulation. SRHM - subretinal hyper-reflective material. IRHM - intraretinal hyper-reflective material      Intravitreal Injection, Pharmacologic Agent - OS - Left Eye       Time Out 08/19/2021. 1:28 PM. Confirmed correct patient, procedure, site, and patient consented.   Anesthesia Topical anesthesia was used. Anesthetic medications included Lidocaine 2%, Proparacaine 0.5%.   Procedure Preparation included 5% betadine to ocular surface, eyelid speculum. A supplied needle was used.   Injection: 1.25 mg Bevacizumab  1.77m/0.05ml   Route: Intravitreal, Site: Left Eye   NDC:: 67124-580-99 Lot: [id number], Expiration date: 09/18/2021, Waste: 0.05 mL   Post-op Post injection exam found visual acuity of at least counting fingers. The patient tolerated the procedure well. There were no complications. The patient received written and verbal post procedure care education.            ASSESSMENT/PLAN:    ICD-10-CM   1. Proliferative diabetic retinopathy of both eyes with macular edema associated with type 2 diabetes mellitus (HCC)  EI33.8250Intravitreal Injection, Pharmacologic Agent - OS - Left Eye    2. Retinal edema  H35.81 OCT, Retina - OU - Both Eyes    3. Essential hypertension  I10     4. Hypertensive retinopathy of both eyes  H35.033     5. Combined forms of age-related cataract of both eyes  H25.813      1,2. 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, OU (OD > OS)  - pt with history of PRP laser OU in [place]~10+ yrs ago -- pt can't remember provider - exam shows scattered MA/IRH and 360 PRP, room for fill in OU - FA (9.19.22) shows late-leaking MA, vascular nonperfusion, +NV OU -- will need PRP fill in OU - s/p IVA OD #1 (09.19.22) - OCT shows diabetic macular edema, both eyes -- OD with interval improvement - recommend IVA OS #1 today, 09.23.22 - pt wishes to proceed with injection OS - RBA of procedure discussed, questions answered - informed consent obtained and signed - see procedure note - f/u next Thursday, DFE, OCT, fill in PRP  3,4. Hypertensive retinopathy OU - discussed importance of tight BP control -  monitor   5. Mixed Cataract OU - The symptoms of cataract, surgical options, and treatments and risks were discussed with patient. - discussed diagnosis and progression - not yet visually significant - monitor for now  Ophthalmic Meds Ordered this visit:  No orders of the defined types were placed in this encounter.     Return in about 6 days (around 08/25/2021)  for f/u PDR OU, DFE, OCT, Laser.  There are no Patient Instructions on file for this visit.    Explained the diagnoses, plan, and follow up with the patient and they expressed understanding.  Patient expressed understanding of the importance of proper follow up care.   This document serves as a record of services personally performed by Gardiner Sleeper, MD, PhD. It was created on their behalf by Leonie Douglas, an ophthalmic technician. The creation of this record is the provider's dictation and/or activities during the visit.    Electronically signed by: Leonie Douglas COA, 08/19/21  2:03 PM  This document serves as a record of services personally performed by Gardiner Sleeper, MD, PhD. It was created on their behalf by San Jetty. Owens Shark, OA an ophthalmic technician. The creation of this record is the provider's dictation and/or activities during the visit.    Electronically signed by: San Jetty. Owens Shark, [place] 09.23.2022 2:03 PM  Gardiner Sleeper, M.D., Ph.D. Diseases & Surgery of the Retina and [place] 08/19/2021  I have reviewed the above documentation for accuracy and completeness, and I agree with the above. Gardiner Sleeper, M.D., Ph.D. 08/20/21 12:22 AM   Abbreviations: M myopia (nearsighted); A astigmatism; H hyperopia (farsighted); P presbyopia; Mrx spectacle prescription;  CTL contact lenses; OD right eye; OS left eye; OU both eyes  XT exotropia; ET esotropia; PEK punctate epithelial keratitis; PEE punctate epithelial erosions; DES dry eye syndrome; MGD meibomian gland dysfunction; ATs artificial tears; PFAT's preservative free artificial tears; Wallace nuclear sclerotic cataract; PSC posterior subcapsular cataract; ERM epi-retinal membrane; PVD posterior vitreous detachment; RD retinal detachment; DM diabetes mellitus; DR diabetic retinopathy; NPDR non-proliferative diabetic retinopathy; PDR proliferative diabetic retinopathy; CSME clinically significant macular  edema; DME diabetic macular edema; dbh dot blot hemorrhages; CWS cotton wool spot; POAG primary open angle glaucoma; C/D cup-to-disc ratio; HVF humphrey visual field; GVF goldmann visual field; OCT optical coherence tomography; IOP intraocular pressure; BRVO Branch retinal vein occlusion; CRVO central retinal vein occlusion; CRAO central retinal artery occlusion; BRAO branch retinal artery occlusion; RT retinal tear; SB scleral buckle; PPV pars plana vitrectomy; VH Vitreous hemorrhage; PRP panretinal laser photocoagulation; IVK intravitreal kenalog; VMT vitreomacular traction; MH Macular hole;  NVD neovascularization of the disc; NVE neovascularization elsewhere; AREDS age related eye disease study; ARMD age related macular degeneration; POAG primary open angle glaucoma; EBMD epithelial/anterior basement membrane dystrophy; ACIOL anterior chamber intraocular lens; IOL intraocular lens; PCIOL posterior chamber intraocular lens; Phaco/IOL phacoemulsification with intraocular lens placement; Coffee photorefractive keratectomy; LASIK laser assisted in situ keratomileusis; HTN hypertension; DM diabetes mellitus; COPD chronic obstructive pulmonary disease

## 2021-08-19 ENCOUNTER — Encounter (INDEPENDENT_AMBULATORY_CARE_PROVIDER_SITE_OTHER): Payer: Self-pay | Admitting: Ophthalmology

## 2021-08-19 ENCOUNTER — Ambulatory Visit (INDEPENDENT_AMBULATORY_CARE_PROVIDER_SITE_OTHER): Payer: No Typology Code available for payment source | Admitting: Ophthalmology

## 2021-08-19 ENCOUNTER — Other Ambulatory Visit: Payer: Self-pay

## 2021-08-19 DIAGNOSIS — H35033 Hypertensive retinopathy, bilateral: Secondary | ICD-10-CM | POA: Diagnosis not present

## 2021-08-19 DIAGNOSIS — H3581 Retinal edema: Secondary | ICD-10-CM | POA: Diagnosis not present

## 2021-08-19 DIAGNOSIS — I10 Essential (primary) hypertension: Secondary | ICD-10-CM | POA: Diagnosis not present

## 2021-08-19 DIAGNOSIS — E113513 Type 2 diabetes mellitus with proliferative diabetic retinopathy with macular edema, bilateral: Secondary | ICD-10-CM

## 2021-08-19 DIAGNOSIS — H25813 Combined forms of age-related cataract, bilateral: Secondary | ICD-10-CM

## 2021-08-19 MED ORDER — BEVACIZUMAB CHEMO INJECTION 1.25MG/0.05ML SYRINGE FOR KALEIDOSCOPE
1.2500 mg | INTRAVITREAL | Status: AC | PRN
  Administered 2021-08-19: 1.25 mg via INTRAVITREAL

## 2021-08-24 NOTE — Progress Notes (Signed)
[facility] Note  08/25/2021     CHIEF COMPLAINT Patient presents for Retina Follow Up  HISTORY OF PRESENT ILLNESS: Edward Riley is a 55 y.o. male who presents to the clinic today for:   HPI     Retina Follow Up   Patient presents with  Diabetic Retinopathy.  In both eyes.  This started 1 week ago.  I, the attending physician,  performed the HPI with the patient and updated documentation appropriately.        Comments   Patient here for 1 weeks retina follow up for PRP PDR OU. Patient states vision doing good. No eye pain.       Last edited by Bernarda Caffey, MD on 08/25/2021 12:08 PM.    Pt here for PRP  Referring physician: Luetta Nutting, DO [address]  [address] [place],  [place] [address]  HISTORICAL INFORMATION:   Selected notes from the MEDICAL RECORD NUMBER Referred by Dr. Luetta Nutting for diabetic eye exam LEE: [facility] Ocular Hx- PMH-    CURRENT MEDICATIONS: Current Outpatient Medications (Ophthalmic Drugs)  Medication Sig   prednisoLONE acetate (PRED FORTE) 1 % ophthalmic suspension Place 1 drop into the right eye 4 (four) times daily for 7 days.   No current facility-administered medications for this visit. (Ophthalmic Drugs)   Current Outpatient Medications (Other)  Medication Sig   AMBULATORY NON FORMULARY MEDICATION Bayer test strips and lancets. Use to check blood sugar twice a day. Dx: Type Two Diabetes Mellitus   amLODipine (NORVASC) 10 MG tablet TAKE 1 TABLET(10 MG) BY MOUTH DAILY   BD PEN NEEDLE NANO U/F 32G X 4 MM MISC USE AS DIRECTED   Blood Glucose Monitoring Suppl (ONE TOUCH ULTRA 2) w/Device KIT Use to check blood sugar twice a day. Send strips and lancets, quantity 100 refills 3.  Dx: Type Two Diabetes Mellitus   Continuous Blood Gluc Sensor (FREESTYLE LIBRE SENSOR SYSTEM) MISC Use as directed. Change device every 2 weeks   Dapagliflozin-metFORMIN HCl ER (XIGDUO XR) 08-999  MG TB24 Take 1 tablet by mouth daily.   glucose blood (ONE TOUCH ULTRA TEST) test strip Use as instructed   hydrochlorothiazide (HYDRODIURIL) 25 MG tablet TAKE 1 TABLET(25 MG) BY MOUTH DAILY   insulin glargine, 1 Unit Dial, (TOUJEO SOLOSTAR) 300 UNIT/ML Solostar Pen Inject 30 Units into the skin daily.   Insulin Pen Needle (PEN NEEDLES) 31G X 8 MM MISC Use the pen needles for both the insulin and victoza pens daily   Lancet Device MISC Check blood sugar once or twice daily as needed.   lisinopril (ZESTRIL) 20 MG tablet Take 1 tablet (20 mg total) by mouth every morning.   pioglitazone (ACTOS) 45 MG tablet Take 1 tablet (45 mg total) by mouth daily.   rosuvastatin (CRESTOR) 20 MG tablet Take 1 tablet (20 mg total) by mouth daily.   Semaglutide (RYBELSUS) 3 MG TABS Take 3 mg by mouth daily.   Semaglutide (RYBELSUS) 7 MG TABS Take 7 mg by mouth daily. Start after completion of 30 days at 14m   tadalafil (CIALIS) 20 MG tablet Take 1 tablet (20 mg total) by mouth every other day.   No current facility-administered medications for this visit. (Other)   REVIEW OF SYSTEMS: ROS   Positive for: Endocrine, Cardiovascular, Eyes Negative for: Constitutional, Gastrointestinal, Neurological, Skin, Genitourinary, Musculoskeletal, HENT, Respiratory, Psychiatric, Allergic/Imm, Heme/Lymph Last edited by CTheodore Demark COA on 08/25/2021  9:44 AM.  ALLERGIES No Known Allergies  PAST MEDICAL HISTORY Past Medical History:  Diagnosis Date   Diabetes mellitus without complication ([place])    Hyperlipidemia    Hypertension    Obese    Retinal hemorrhage, both eyes    Sessile colonic polyp 02/04/2019   Colonoscopy February 2020.   History reviewed. No pertinent surgical history.  FAMILY HISTORY Family History  Problem Relation Age of Onset   Arthritis Mother    Hyperlipidemia Mother    Hypertension Mother    Cancer Father 62       Prostate (Metastatic) Lung, Esophagus, Stomach   Alcohol abuse  Father    Kidney disease Brother    Hypertension Brother    Cancer Paternal Aunt        Breast Cancer   Alzheimer's disease Maternal Grandmother    Heart disease Paternal Grandmother    Diabetes Brother    Alcohol abuse Maternal Uncle    SOCIAL HISTORY Social History   Tobacco Use   Smoking status: Never   Smokeless tobacco: Never  Substance Use Topics   Alcohol use: Yes    Comment: Rarely   Drug use: No       OPHTHALMIC EXAM:  Base Eye Exam     Visual Acuity (Snellen - Linear)       Right Left   Dist cc 20/40 20/30   Dist ph cc 20/30 20/25    Correction: Glasses         Tonometry (Tonopen, 9:41 AM)       Right Left   Pressure 21 21         Pupils       Dark Light Shape React APD   Right 2 1 Round Minimal None   Left 2 1 Round Minimal None         Visual Fields (Counting fingers)       Left Right    Full Full         Extraocular Movement       Right Left    Full Full         Neuro/Psych     Oriented x3: Yes   Mood/Affect: Normal         Dilation     Both eyes: 1.0% Mydriacyl, 2.5% Phenylephrine @ 9:41 AM           Slit Lamp and Fundus Exam     Slit Lamp Exam       Right Left   Lids/Lashes Dermatochalasis - upper lid Dermatochalasis - upper lid   Conjunctiva/Sclera nasal pingeucula nasal and temporal pinguecula   Cornea trace PEE trace PEE   Anterior Chamber Deep and quiet Deep and quiet   Iris Round and dilated, No NVI Round and dilated, No NVI   Lens 2+ Nuclear sclerosis, 2+ Cortical cataract 2+ Nuclear sclerosis, 2+ Cortical cataract   Vitreous Mild Vitreous syneresis Mild Vitreous syneresis         Fundus Exam       Right Left   Disc Pink and Sharp, PPA, Compact mild tilt, Pink and Sharp, PPA   C/D Ratio 0.2 0.2   Macula Blunted foveal reflex, trace MA, focal exudates superior macula, +perifoveal cystic changes -- slightly improved Good foveal reflex, Microaneurysms, Cystic changes temporal to fovea    Vessels mild attenuation, mild Copper wiring, mild AV crossing changes, ?NV temporal periphery mild attenuation, mild Copper wiring, mild AV crossing changes, trace focal fibrosis along distal IT arcade, ?NV temporal  periphery   Periphery Attached, 360 PRP with room for fill in, pre-retinal heme temporal periphery    Attached, 360 PRP with room for fill in, pre-retinal heme temporal periphery              Refraction     Wearing Rx       Sphere Cylinder Axis   Right -2.50 +1.00 138   Left -2.25 +1.00 173            IMAGING AND PROCEDURES  Imaging and Procedures for 08/25/2021  OCT, Retina - OU - Both Eyes       Right Eye Quality was good. Central Foveal Thickness: 498. Progression has worsened. Findings include abnormal foveal contour, intraretinal fluid, no SRF, vitreomacular adhesion (Interval increase in IRF, interval development of trace subfoveal pocket of SRF).   Left Eye Quality was good. Central Foveal Thickness: 353. Progression has improved. Findings include normal foveal contour, no SRF, intraretinal fluid, vitreomacular adhesion (Mild interval improvement in temporal IRF/cystic changes).   Notes *Images captured and stored on drive  Diagnosis / Impression:  +DME OU OD: Interval increase in IRF, interval development of trace subfoveal pocket of SRF OS: Mild interval improvement in temporal IRF/cystic changes  Clinical management:  See below  Abbreviations: NFP - Normal foveal profile. CME - cystoid macular edema. PED - pigment epithelial detachment. IRF - intraretinal fluid. SRF - subretinal fluid. EZ - ellipsoid zone. ERM - epiretinal membrane. ORA - outer retinal atrophy. ORT - outer retinal tubulation. SRHM - subretinal hyper-reflective material. IRHM - intraretinal hyper-reflective material      Panretinal Photocoagulation - OD - Right Eye       LASER PROCEDURE NOTE  Diagnosis:   Proliferative Diabetic Retinopathy, RIGHT EYE  Procedure:   Pan-retinal photocoagulation using slit lamp laser, RIGHT EYE, fill-in  Anesthesia:  Topical  Surgeon: Bernarda Caffey, MD, PhD   Informed consent obtained, operative eye marked, and time out performed prior to initiation of laser.   Lumenis [id number] slit lamp laser Pattern: 3x3 square Power: 380 mW Duration: 40 msec  Spot size: 200 microns  # spots: 1428 spots of fill-in PRP laser  Complications: None.  RTC: 3 wks for DFE/OCT, possible injection vs laser OS  Patient tolerated the procedure well and received written and verbal post-procedure care information/education.            ASSESSMENT/PLAN:    ICD-10-CM   1. Proliferative diabetic retinopathy of both eyes with macular edema associated with type 2 diabetes mellitus ([place])  R74.0814 Panretinal Photocoagulation - OD - Right Eye    2. Retinal edema  H35.81 OCT, Retina - OU - Both Eyes    3. Essential hypertension  I10     4. Hypertensive retinopathy of both eyes  H35.033     5. Combined forms of age-related cataract of both eyes  H25.813      1,2. 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, OU (OD > OS)  - pt with history of PRP laser OU in [place] ~10+ yrs ago -- pt can't remember provider - exam shows scattered MA/IRH and 360 PRP, room for fill in OU - FA (9.19.22) shows late-leaking MA, vascular nonperfusion, +NV OU -- will need PRP fill in OU - s/p IVA OD #1 (09.19.22) - s/p IVA OS #1 (09.23.22) - OCT shows +DME OU; OD with Interval increase in IRF, interval development of trace subfoveal pocket of SRF; OS w/ mild interval dec in IRF/cystic changes - recommend PRP OD today, 09.29.22 -  pt wishes to proceed with laser procedure - RBA of procedure discussed, questions answered - informed consent obtained and signed - see procedure note - start PF QID OD x7 days - f/u in 3 wks -- DFE/OCT, possible injection vs laser PRP OS  3,4. Hypertensive retinopathy OU - discussed importance of tight BP control -  monitor   5. Mixed Cataract OU - The symptoms of cataract, surgical options, and treatments and risks were discussed with patient. - discussed diagnosis and progression - not yet visually significant - monitor for now  Ophthalmic Meds Ordered this visit:  Meds ordered this encounter  Medications   prednisoLONE acetate (PRED FORTE) 1 % ophthalmic suspension    Sig: Place 1 drop into the right eye 4 (four) times daily for 7 days.    Dispense:  10 mL    Refill:  0       Return for f/u October 21, PDR OU, DFE, OCT.  There are no Patient Instructions on file for this visit.    Explained the diagnoses, plan, and follow up with the patient and they expressed understanding.  Patient expressed understanding of the importance of proper follow up care.   This document serves as a record of services personally performed by Gardiner Sleeper, MD, PhD. It was created on their behalf by San Jetty. Owens Shark, OA an ophthalmic technician. The creation of this record is the provider's dictation and/or activities during the visit.    Electronically signed by: San Jetty. Owens Shark, [place] 09.28.2022 12:16 PM   Gardiner Sleeper, M.D., Ph.D. Diseases & Surgery of the Retina and Vitreous [facility]  I have reviewed the above documentation for accuracy and completeness, and I agree with the above. Gardiner Sleeper, M.D., Ph.D. 08/25/21 12:16 PM  Abbreviations: M myopia (nearsighted); A astigmatism; H hyperopia (farsighted); P presbyopia; Mrx spectacle prescription;  CTL contact lenses; OD right eye; OS left eye; OU both eyes  XT exotropia; ET esotropia; PEK punctate epithelial keratitis; PEE punctate epithelial erosions; DES dry eye syndrome; MGD meibomian gland dysfunction; ATs artificial tears; PFAT's preservative free artificial tears; [place] nuclear sclerotic cataract; PSC posterior subcapsular cataract; ERM epi-retinal membrane; PVD posterior vitreous detachment; RD retinal detachment; DM  diabetes mellitus; DR diabetic retinopathy; NPDR non-proliferative diabetic retinopathy; PDR proliferative diabetic retinopathy; CSME clinically significant macular edema; DME diabetic macular edema; dbh dot blot hemorrhages; CWS cotton wool spot; POAG primary open angle glaucoma; C/D cup-to-disc ratio; HVF humphrey visual field; GVF goldmann visual field; OCT optical coherence tomography; IOP intraocular pressure; BRVO Branch retinal vein occlusion; CRVO central retinal vein occlusion; CRAO central retinal artery occlusion; BRAO branch retinal artery occlusion; RT retinal tear; SB scleral buckle; PPV pars plana vitrectomy; VH Vitreous hemorrhage; PRP panretinal laser photocoagulation; IVK intravitreal kenalog; VMT vitreomacular traction; MH Macular hole;  NVD neovascularization of the disc; NVE neovascularization elsewhere; AREDS age related eye disease study; ARMD age related macular degeneration; POAG primary open angle glaucoma; EBMD epithelial/anterior basement membrane dystrophy; ACIOL anterior chamber intraocular lens; IOL intraocular lens; PCIOL posterior chamber intraocular lens; Phaco/IOL phacoemulsification with intraocular lens placement; [place] photorefractive keratectomy; LASIK laser assisted in situ keratomileusis; HTN hypertension; DM diabetes mellitus; COPD chronic obstructive pulmonary disease

## 2021-08-25 ENCOUNTER — Encounter (INDEPENDENT_AMBULATORY_CARE_PROVIDER_SITE_OTHER): Payer: Self-pay | Admitting: Ophthalmology

## 2021-08-25 ENCOUNTER — Other Ambulatory Visit: Payer: Self-pay

## 2021-08-25 ENCOUNTER — Ambulatory Visit (INDEPENDENT_AMBULATORY_CARE_PROVIDER_SITE_OTHER): Payer: No Typology Code available for payment source | Admitting: Ophthalmology

## 2021-08-25 DIAGNOSIS — H25813 Combined forms of age-related cataract, bilateral: Secondary | ICD-10-CM

## 2021-08-25 DIAGNOSIS — H35033 Hypertensive retinopathy, bilateral: Secondary | ICD-10-CM | POA: Diagnosis not present

## 2021-08-25 DIAGNOSIS — E113513 Type 2 diabetes mellitus with proliferative diabetic retinopathy with macular edema, bilateral: Secondary | ICD-10-CM

## 2021-08-25 DIAGNOSIS — I10 Essential (primary) hypertension: Secondary | ICD-10-CM | POA: Diagnosis not present

## 2021-08-25 DIAGNOSIS — H3581 Retinal edema: Secondary | ICD-10-CM

## 2021-08-25 MED ORDER — PREDNISOLONE ACETATE 1 % OP SUSP: 1.0000 [drp] | Freq: Four times a day (QID) | OPHTHALMIC | 0 refills | Status: AC

## 2021-09-14 NOTE — Progress Notes (Signed)
[facility] Note  09/16/2021     CHIEF COMPLAINT Patient presents for Retina Follow Up  HISTORY OF PRESENT ILLNESS: Edward Riley is a 55 y.o. male who presents to the clinic today for:   HPI     Retina Follow Up   Patient presents with  Diabetic Retinopathy.  In both eyes.  This started 3 weeks ago.  I, the attending physician,  performed the HPI with the patient and updated documentation appropriately.        Comments   Patient here for 3 weeks retina follow up for PDR OU. Po PRP OD. Patient states vision about the same. No eye pain. OD feels like it is dry all the time. Like a rough spot on outside of OD.       Last edited by Bernarda Caffey, MD on 09/16/2021  3:58 PM.     Referring physician: Luetta Nutting, DO [address]  [address] [place],  [place] [address]  HISTORICAL INFORMATION:   Selected notes from the MEDICAL RECORD NUMBER Referred by Dr. Luetta Nutting for diabetic eye exam LEE: [facility] Ocular Hx- PMH-    CURRENT MEDICATIONS: No current outpatient medications on file. (Ophthalmic Drugs)   No current facility-administered medications for this visit. (Ophthalmic Drugs)   Current Outpatient Medications (Other)  Medication Sig   AMBULATORY NON FORMULARY MEDICATION Bayer test strips and lancets. Use to check blood sugar twice a day. Dx: Type Two Diabetes Mellitus   amLODipine (NORVASC) 10 MG tablet TAKE 1 TABLET(10 MG) BY MOUTH DAILY   BD PEN NEEDLE NANO U/F 32G X 4 MM MISC USE AS DIRECTED   Blood Glucose Monitoring Suppl (ONE TOUCH ULTRA 2) w/Device KIT Use to check blood sugar twice a day. Send strips and lancets, quantity 100 refills 3.  Dx: Type Two Diabetes Mellitus   Continuous Blood Gluc Sensor (FREESTYLE LIBRE SENSOR SYSTEM) MISC Use as directed. Change device every 2 weeks   Dapagliflozin-metFORMIN HCl ER (XIGDUO XR) 08-999 MG TB24 Take 1 tablet by mouth daily.   glucose blood (ONE  TOUCH ULTRA TEST) test strip Use as instructed   hydrochlorothiazide (HYDRODIURIL) 25 MG tablet TAKE 1 TABLET(25 MG) BY MOUTH DAILY   insulin glargine, 1 Unit Dial, (TOUJEO SOLOSTAR) 300 UNIT/ML Solostar Pen Inject 30 Units into the skin daily.   Insulin Pen Needle (PEN NEEDLES) 31G X 8 MM MISC Use the pen needles for both the insulin and victoza pens daily   Lancet Device MISC Check blood sugar once or twice daily as needed.   lisinopril (ZESTRIL) 20 MG tablet Take 1 tablet (20 mg total) by mouth every morning.   pioglitazone (ACTOS) 45 MG tablet Take 1 tablet (45 mg total) by mouth daily.   rosuvastatin (CRESTOR) 20 MG tablet Take 1 tablet (20 mg total) by mouth daily.   Semaglutide (RYBELSUS) 3 MG TABS Take 3 mg by mouth daily.   Semaglutide (RYBELSUS) 7 MG TABS Take 7 mg by mouth daily. Start after completion of 30 days at $Remov'3mg'pxZPno$    tadalafil (CIALIS) 20 MG tablet Take 1 tablet (20 mg total) by mouth every other day.   No current facility-administered medications for this visit. (Other)   REVIEW OF SYSTEMS: ROS   Positive for: Endocrine, Cardiovascular, Eyes Negative for: Constitutional, Gastrointestinal, Neurological, Skin, Genitourinary, Musculoskeletal, HENT, Respiratory, Psychiatric, Allergic/Imm, Heme/Lymph Last edited by Theodore Demark, COA on 09/16/2021  2:27 PM.     ALLERGIES No Known Allergies  PAST MEDICAL HISTORY Past Medical History:  Diagnosis Date   Diabetes mellitus without complication ([place])    Hyperlipidemia    Hypertension    Obese    Retinal hemorrhage, both eyes    Sessile colonic polyp 02/04/2019   Colonoscopy February 2020.   History reviewed. No pertinent surgical history.  FAMILY HISTORY Family History  Problem Relation Age of Onset   Arthritis Mother    Hyperlipidemia Mother    Hypertension Mother    Cancer Father 5       Prostate (Metastatic) Lung, Esophagus, Stomach   Alcohol abuse Father    Kidney disease Brother    Hypertension Brother     Cancer Paternal Aunt        Breast Cancer   Alzheimer's disease Maternal Grandmother    Heart disease Paternal Grandmother    Diabetes Brother    Alcohol abuse Maternal Uncle    SOCIAL HISTORY Social History   Tobacco Use   Smoking status: Never   Smokeless tobacco: Never  Substance Use Topics   Alcohol use: Yes    Comment: Rarely   Drug use: No       OPHTHALMIC EXAM: Base Eye Exam     Visual Acuity (Snellen - Linear)       Right Left   Dist cc 20/30 -1 20/25 -1   Dist ph cc 20/25 -2 20/20    Correction: Glasses         Tonometry (Tonopen, 2:24 PM)       Right Left   Pressure 15 14         Pupils       Dark Light Shape React APD   Right 2 1 Round Minimal None   Left 2 1 Round Minimal None         Visual Fields (Counting fingers)       Left Right    Full Full         Extraocular Movement       Right Left    Full Full         Neuro/Psych     Oriented x3: Yes   Mood/Affect: Normal         Dilation     Both eyes: 1.0% Mydriacyl, 2.5% Phenylephrine @ 2:23 PM           Slit Lamp and Fundus Exam     Slit Lamp Exam       Right Left   Lids/Lashes Dermatochalasis - upper lid Dermatochalasis - upper lid   Conjunctiva/Sclera nasal pingeucula nasal and temporal pinguecula   Cornea trace PEE trace PEE   Anterior Chamber Deep and quiet Deep and quiet   Iris Round and dilated, No NVI Round and dilated, No NVI   Lens 2+ Nuclear sclerosis, 2+ Cortical cataract 2+ Nuclear sclerosis, 2+ Cortical cataract   Vitreous Mild Vitreous syneresis Mild Vitreous syneresis         Fundus Exam       Right Left   Disc Pink and Sharp, PPA, Compact mild tilt, Pink and Sharp, PPA   C/D Ratio 0.2 0.2   Macula Blunted foveal reflex, trace MA, focal exudates superior macula -- improved, +perifoveal cystic changes -- slightly increased Good foveal reflex, Microaneurysms -- improved, Cystic changes temporal to fovea -- improved   Vessels mild  attenuation, mild Copper wiring, mild AV crossing changes, ?NV temporal periphery mild attenuation, mild Copper wiring, mild AV crossing changes, trace focal fibrosis along distal IT  arcade, ?NV temporal periphery   Periphery Attached, 360 PRP with good laser fill in, pre-retinal heme temporal periphery    Attached, 360 PRP with room for fill in, scattered IRH/DBH             Refraction     Wearing Rx       Sphere Cylinder Axis   Right -2.50 +1.00 138   Left -2.25 +1.00 173           IMAGING AND PROCEDURES  Imaging and Procedures for 09/16/2021  OCT, Retina - OU - Both Eyes       Right Eye Quality was good. Central Foveal Thickness: 549. Progression has worsened. Findings include abnormal foveal contour, intraretinal fluid, no SRF, vitreomacular adhesion (Mild Interval increase in IRF, interval improvement in central SRF).   Left Eye Quality was good. Central Foveal Thickness: 354. Progression has improved. Findings include normal foveal contour, no SRF, intraretinal fluid, vitreomacular adhesion (Mild interval improvement in temporal IRF/cystic changes -- essentially resolved).   Notes *Images captured and stored on drive  Diagnosis / Impression:  +DME OU OD: Mild Interval increase in IRF, interval improvement in central SRF OS: Mild interval improvement in temporal IRF/cystic changes -- essentially resolved  Clinical management:  See below  Abbreviations: NFP - Normal foveal profile. CME - cystoid macular edema. PED - pigment epithelial detachment. IRF - intraretinal fluid. SRF - subretinal fluid. EZ - ellipsoid zone. ERM - epiretinal membrane. ORA - outer retinal atrophy. ORT - outer retinal tubulation. SRHM - subretinal hyper-reflective material. IRHM - intraretinal hyper-reflective material      Intravitreal Injection, Pharmacologic Agent - OD - Right Eye       Time Out 09/16/2021. 3:36 PM. Confirmed correct patient, procedure, site, and patient consented.    Anesthesia Topical anesthesia was used. Anesthetic medications included Lidocaine 2%, Proparacaine 0.5%.   Procedure Preparation included 5% betadine to ocular surface, eyelid speculum. A supplied needle was used.   Injection: 1.25 mg Bevacizumab 1.62m/0.05ml   Route: Intravitreal, Site: Right Eye   NDC:: [id number] Lot: [id number] , Expiration date: 11/09/2021, Waste: 0 mL   Post-op Post injection exam found visual acuity of at least counting fingers. The patient tolerated the procedure well. There were no complications. The patient received written and verbal post procedure care education. Post injection medications were not given.      Intravitreal Injection, Pharmacologic Agent - OS - Left Eye       Time Out 09/16/2021. 3:38 PM. Confirmed correct patient, procedure, site, and patient consented.   Anesthesia Topical anesthesia was used. Anesthetic medications included Lidocaine 2%, Proparacaine 0.5%.   Procedure Preparation included 5% betadine to ocular surface, eyelid speculum. A (32 g) needle was used.   Injection: 1.25 mg Bevacizumab 1.286m0.05ml   Route: Intravitreal, Site: Left Eye   NDC: [id number]: : [id number] date: 11/15/2021, Waste: 0.05 mL   Post-op Post injection exam found visual acuity of at least counting fingers. The patient tolerated the procedure well. There were no complications. The patient received written and verbal post procedure care education. Post injection medications were not given.            ASSESSMENT/PLAN:   ICD-10-CM   1. Proliferative diabetic retinopathy of both eyes with macular edema associated with type 2 diabetes mellitus (HCC)  E1L89.3734ntravitreal Injection, Pharmacologic Agent - OD - Right Eye    Intravitreal Injection, Pharmacologic Agent - OS - Left Eye    Bevacizumab (AVASTIN) SOLN 1.25 mg  Bevacizumab (AVASTIN) SOLN 1.25 mg    2. Retinal edema  H35.81 OCT, Retina - OU - Both Eyes    3.  Essential hypertension  I10     4. Hypertensive retinopathy of both eyes  H35.033     5. Combined forms of age-related cataract of both eyes  H25.813     1,2. 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, OU (OD > OS)  - pt with history of PRP laser OU in [place] ~10+ yrs ago -- pt can't remember provider - exam shows scattered MA/IRH and 360 PRP, room for fill in OU - FA (9.19.22) shows late-leaking MA, vascular nonperfusion, +NV OU -- will need PRP fill in OU - s/p IVA OD #1 (09.19.22) - s/p IVA OS #1 (09.23.22) - s/p PRP OD (09.29.22) - OCT shows +DME OU; Mild Interval increase in IRF, interval improvement in central SRF; OS Mild interval improvement in temporal IRF/cystic changes -- essentially improved - recommend IVA OU #2 today, 10.21.22 - pt wishes to proceed with laser procedure - RBA of procedure discussed, questions answered - informed consent obtained and signed - see procedure note - f/u in 2-3 wks -- DFE/OCT, PRP OS fill in - f/u in 4 wks for DFE/OCT, possible injection(s)  3,4. Hypertensive retinopathy OU - discussed importance of tight BP control - monitor   5. Mixed Cataract OU - The symptoms of cataract, surgical options, and treatments and risks were discussed with patient. - discussed diagnosis and progression - not yet visually significant - monitor for now  Ophthalmic Meds Ordered this visit:  Meds ordered this encounter  Medications   Bevacizumab (AVASTIN) SOLN 1.25 mg   Bevacizumab (AVASTIN) SOLN 1.25 mg     Return for 2-3 weeks , DFE, OCT, PRP OS fill in .  There are no Patient Instructions on file for this visit.   Explained the diagnoses, plan, and follow up with the patient and they expressed understanding.  Patient expressed understanding of the importance of proper follow up care.   This document serves as a record of services personally performed by Gardiner Sleeper, MD, PhD. It was created on their behalf by Leonie Douglas, an ophthalmic  technician. The creation of this record is the provider's dictation and/or activities during the visit.    Electronically signed by: Leonie Douglas COA, 09/16/21  4:02 PM  Gardiner Sleeper, M.D., Ph.D. Diseases & Surgery of the Retina and [place] 09/16/2021  I have reviewed the above documentation for accuracy and completeness, and I agree with the above. Gardiner Sleeper, M.D., Ph.D. 09/16/21 4:02 PM   Abbreviations: M myopia (nearsighted); A astigmatism; H hyperopia (farsighted); P presbyopia; Mrx spectacle prescription;  CTL contact lenses; OD right eye; OS left eye; OU both eyes  XT exotropia; ET esotropia; PEK punctate epithelial keratitis; PEE punctate epithelial erosions; DES dry eye syndrome; MGD meibomian gland dysfunction; ATs artificial tears; PFAT's preservative free artificial tears; [place] nuclear sclerotic cataract; PSC posterior subcapsular cataract; ERM epi-retinal membrane; PVD posterior vitreous detachment; RD retinal detachment; DM diabetes mellitus; DR diabetic retinopathy; NPDR non-proliferative diabetic retinopathy; PDR proliferative diabetic retinopathy; CSME clinically significant macular edema; DME diabetic macular edema; dbh dot blot hemorrhages; CWS cotton wool spot; POAG primary open angle glaucoma; C/D cup-to-disc ratio; HVF humphrey visual field; GVF goldmann visual field; OCT optical coherence tomography; IOP intraocular pressure; BRVO Branch retinal vein occlusion; CRVO central retinal vein occlusion; CRAO central retinal artery occlusion; BRAO branch retinal artery occlusion; RT  retinal tear; SB scleral buckle; PPV pars plana vitrectomy; VH Vitreous hemorrhage; PRP panretinal laser photocoagulation; IVK intravitreal kenalog; VMT vitreomacular traction; MH Macular hole;  NVD neovascularization of the disc; NVE neovascularization elsewhere; AREDS age related eye disease study; ARMD age related macular degeneration; POAG primary open angle  glaucoma; EBMD epithelial/anterior basement membrane dystrophy; ACIOL anterior chamber intraocular lens; IOL intraocular lens; PCIOL posterior chamber intraocular lens; Phaco/IOL phacoemulsification with intraocular lens placement; [place] photorefractive keratectomy; LASIK laser assisted in situ keratomileusis; HTN hypertension; DM diabetes mellitus; COPD chronic obstructive pulmonary disease

## 2021-09-16 ENCOUNTER — Ambulatory Visit (INDEPENDENT_AMBULATORY_CARE_PROVIDER_SITE_OTHER): Payer: No Typology Code available for payment source | Admitting: Ophthalmology

## 2021-09-16 ENCOUNTER — Other Ambulatory Visit: Payer: Self-pay

## 2021-09-16 ENCOUNTER — Encounter (INDEPENDENT_AMBULATORY_CARE_PROVIDER_SITE_OTHER): Payer: Self-pay | Admitting: Ophthalmology

## 2021-09-16 DIAGNOSIS — H3581 Retinal edema: Secondary | ICD-10-CM

## 2021-09-16 DIAGNOSIS — E113513 Type 2 diabetes mellitus with proliferative diabetic retinopathy with macular edema, bilateral: Secondary | ICD-10-CM

## 2021-09-16 DIAGNOSIS — H25813 Combined forms of age-related cataract, bilateral: Secondary | ICD-10-CM | POA: Diagnosis not present

## 2021-09-16 DIAGNOSIS — I10 Essential (primary) hypertension: Secondary | ICD-10-CM | POA: Diagnosis not present

## 2021-09-16 DIAGNOSIS — H35033 Hypertensive retinopathy, bilateral: Secondary | ICD-10-CM

## 2021-09-16 MED ORDER — BEVACIZUMAB CHEMO INJECTION 1.25MG/0.05ML SYRINGE FOR KALEIDOSCOPE
1.2500 mg | INTRAVITREAL | Status: AC | PRN
  Administered 2021-09-16: 1.25 mg via INTRAVITREAL

## 2021-10-07 NOTE — Progress Notes (Signed)
[facility] Note  10/10/2021     CHIEF COMPLAINT Patient presents for Retina Follow Up  HISTORY OF PRESENT ILLNESS: Edward Riley is a 55 y.o. male who presents to the clinic today for:   HPI     Retina Follow Up   Patient presents with  Diabetic Retinopathy.  In both eyes.  I, the attending physician,  performed the HPI with the patient and updated documentation appropriately.        Comments   3 week follow up PDR OU- Doing well.  No changes in vision since last visit.  BS 150 last week A1C unsure      Last edited by Bernarda Caffey, MD on 10/10/2021  1:10 PM.    Pt here for PRP fill in OS today  Referring physician: Luetta Nutting, DO [address] [place],  [place] [address]  HISTORICAL INFORMATION:   Selected notes from the MEDICAL RECORD NUMBER Referred by Dr. Luetta Nutting for diabetic eye exam LEE: [facility] Ocular Hx- PMH-    CURRENT MEDICATIONS: Current Outpatient Medications (Ophthalmic Drugs)  Medication Sig   prednisoLONE acetate (PRED FORTE) 1 % ophthalmic suspension Place 1 drop into the left eye 4 (four) times daily for 7 days.   No current facility-administered medications for this visit. (Ophthalmic Drugs)   Current Outpatient Medications (Other)  Medication Sig   AMBULATORY NON FORMULARY MEDICATION Bayer test strips and lancets. Use to check blood sugar twice a day. Dx: Type Two Diabetes Mellitus   amLODipine (NORVASC) 10 MG tablet TAKE 1 TABLET(10 MG) BY MOUTH DAILY   Dapagliflozin-metFORMIN HCl ER (XIGDUO XR) 08-999 MG TB24 Take 1 tablet by mouth daily.   hydrochlorothiazide (HYDRODIURIL) 25 MG tablet TAKE 1 TABLET(25 MG) BY MOUTH DAILY   lisinopril (ZESTRIL) 20 MG tablet Take 1 tablet (20 mg total) by mouth every morning.   pioglitazone (ACTOS) 45 MG tablet Take 1 tablet (45 mg total) by mouth daily.   rosuvastatin (CRESTOR) 20 MG tablet Take 1 tablet (20 mg total) by  mouth daily.   Semaglutide (RYBELSUS) 7 MG TABS Take 7 mg by mouth daily. Start after completion of 30 days at 36m   tadalafil (CIALIS) 20 MG tablet Take 1 tablet (20 mg total) by mouth every other day.   BD PEN NEEDLE NANO U/F 32G X 4 MM MISC USE AS DIRECTED   Blood Glucose Monitoring Suppl (ONE TOUCH ULTRA 2) w/Device KIT Use to check blood sugar twice a day. Send strips and lancets, quantity 100 refills 3.  Dx: Type Two Diabetes Mellitus   Continuous Blood Gluc Sensor (FREESTYLE LIBRE SENSOR SYSTEM) MISC Use as directed. Change device every 2 weeks   glucose blood (ONE TOUCH ULTRA TEST) test strip Use as instructed   insulin glargine, 1 Unit Dial, (TOUJEO SOLOSTAR) 300 UNIT/ML Solostar Pen Inject 30 Units into the skin daily.   Insulin Pen Needle (PEN NEEDLES) 31G X 8 MM MISC Use the pen needles for both the insulin and victoza pens daily   Lancet Device MISC Check blood sugar once or twice daily as needed.   Semaglutide (RYBELSUS) 3 MG TABS Take 3 mg by mouth daily.   No current facility-administered medications for this visit. (Other)   REVIEW OF SYSTEMS: ROS   Positive for: Endocrine, Cardiovascular, Eyes Negative for: Constitutional, Gastrointestinal, Neurological, Skin, Genitourinary, Musculoskeletal, HENT, Respiratory, Psychiatric, Allergic/Imm, Heme/Lymph Last edited by HLeonie Douglas COA on 10/10/2021  7:58 AM.  ALLERGIES No Known Allergies  PAST MEDICAL HISTORY Past Medical History:  Diagnosis Date   Diabetes mellitus without complication ([place])    Hyperlipidemia    Hypertension    Obese    Retinal hemorrhage, both eyes    Sessile colonic polyp 02/04/2019   Colonoscopy February 2020.   History reviewed. No pertinent surgical history.  FAMILY HISTORY Family History  Problem Relation Age of Onset   Arthritis Mother    Hyperlipidemia Mother    Hypertension Mother    Cancer Father 48       Prostate (Metastatic) Lung, Esophagus, Stomach   Alcohol abuse Father     Kidney disease Brother    Hypertension Brother    Cancer Paternal Aunt        Breast Cancer   Alzheimer's disease Maternal Grandmother    Heart disease Paternal Grandmother    Diabetes Brother    Alcohol abuse Maternal Uncle    SOCIAL HISTORY Social History   Tobacco Use   Smoking status: Never   Smokeless tobacco: Never  Substance Use Topics   Alcohol use: Yes    Comment: Rarely   Drug use: No       OPHTHALMIC EXAM: Base Eye Exam     Visual Acuity (Snellen - Linear)       Right Left   Dist cc 20/30 +1 20/25 -2   Dist ph cc 20/25 +2 20/25         Tonometry (Tonopen, 8:04 AM)       Right Left   Pressure 16 16         Pupils       Dark Light Shape React APD   Right 3 2 Round Minimal None   Left 3 2 Round Minimal None         Visual Fields (Counting fingers)       Left Right    Full Full         Extraocular Movement       Right Left    Full Full         Neuro/Psych     Oriented x3: Yes   Mood/Affect: Normal         Dilation     Both eyes: 1.0% Mydriacyl, 2.5% Phenylephrine @ 8:04 AM           Slit Lamp and Fundus Exam     Slit Lamp Exam       Right Left   Lids/Lashes Dermatochalasis - upper lid Dermatochalasis - upper lid   Conjunctiva/Sclera nasal pingeucula nasal and temporal pinguecula   Cornea trace PEE trace PEE   Anterior Chamber Deep and quiet Deep and quiet   Iris Round and dilated, No NVI Round and dilated, No NVI   Lens 2+ Nuclear sclerosis, 2+ Cortical cataract 2+ Nuclear sclerosis, 2+ Cortical cataract   Vitreous Mild Vitreous syneresis Mild Vitreous syneresis         Fundus Exam       Right Left   Disc Pink and Sharp, PPA, Compact mild tilt, Pink and Sharp, PPA   C/D Ratio 0.2 0.2   Macula Blunted foveal reflex, trace MA, focal exudates superior macula -- improved, +perifoveal cystic changes -- slightly increased Good foveal reflex, Microaneurysms -- improved, Cystic changes temporal to fovea --  improved   Vessels mild attenuation, mild Copper wiring, mild AV crossing changes, ?NV temporal periphery mild attenuation, mild Copper wiring, mild AV crossing changes, trace focal fibrosis along distal IT  arcade, ?NV temporal periphery   Periphery Attached, 360 PRP with good laser fill in, pre-retinal heme temporal periphery    Attached, 360 PRP with room for fill in, scattered IRH/DBH             Refraction     Wearing Rx       Sphere Cylinder Axis   Right -2.50 +1.00 138   Left -2.25 +1.00 173           IMAGING AND PROCEDURES  Imaging and Procedures for 10/10/2021  OCT, Retina - OU - Both Eyes       Right Eye Quality was good. Central Foveal Thickness: 540. Progression has been stable. Findings include abnormal foveal contour, intraretinal fluid, no SRF, vitreomacular adhesion (persistent IRF).   Left Eye Quality was good. Central Foveal Thickness: 348. Progression has improved. Findings include normal foveal contour, no SRF, intraretinal fluid, vitreomacular adhesion (persistent IRF/cystic changes temporal macula).   Notes *Images captured and stored on drive  Diagnosis / Impression:  +DME OU OD: persistent IRF OS: persistent IRF/cystic changes temporal macula  Clinical management:  See below  Abbreviations: NFP - Normal foveal profile. CME - cystoid macular edema. PED - pigment epithelial detachment. IRF - intraretinal fluid. SRF - subretinal fluid. EZ - ellipsoid zone. ERM - epiretinal membrane. ORA - outer retinal atrophy. ORT - outer retinal tubulation. SRHM - subretinal hyper-reflective material. IRHM - intraretinal hyper-reflective material      Panretinal Photocoagulation - OS - Left Eye       Time Out Confirmed correct patient, procedure, site, and patient consented.   Notes LASER PROCEDURE NOTE  Diagnosis:   Proliferative Diabetic Retinopathy, LEFT EYE  Procedure:  Pan-retinal photocoagulation using slit lamp laser, LEFT EYE,  fill-in  Anesthesia:  Topical  Surgeon: Bernarda Caffey, MD, PhD   Informed consent obtained, operative eye marked, and time out performed prior to initiation of laser.   Lumenis [id number] slit lamp laser Pattern: 2x2 square Power: 420 mW Duration: 30 msec  Spot size: 200 microns  # spots: 759 spots posterior fill-in -- tighter to arcades  Complications: None.  RTC: 1 wk -- DFE/OCT, possible injxns  Patient tolerated the procedure well and received written and verbal post-procedure care information/education.            ASSESSMENT/PLAN:   ICD-10-CM   1. Proliferative diabetic retinopathy of both eyes with macular edema associated with type 2 diabetes mellitus ([place])  Z36.6440 Panretinal Photocoagulation - OS - Left Eye    2. Retinal edema  H35.81 OCT, Retina - OU - Both Eyes    3. Essential hypertension  I10     4. Hypertensive retinopathy of both eyes  H35.033     5. Combined forms of age-related cataract of both eyes  H25.813      1,2. 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, OU (OD > OS)  - pt with history of PRP laser OU in [place] ~10+ yrs ago -- pt can't remember provider - exam shows scattered MA/IRH and 360 PRP, room for fill in OU - FA (9.19.22) shows late-leaking MA, vascular nonperfusion, +NV OU -- will need PRP fill in OU - s/p IVA OD #1 (09.19.22) - s/p IVA OS #1 (09.23.22) - s/p IVA OU #2 (10.21.22) - s/p PRP OD (09.29.22) - OCT shows +DME OU; OD: persistent IRF; OS: persistent IRF/cystic changes temporal macula - recommend PRP OS fill in today, 11.14.22 - pt wishes to proceed with laser procedure - RBA of procedure discussed,  questions answered - informed consent obtained and signed - start PF QID OS x7 days - see procedure note - f/u Weds, Nov. 23 at 10:00 DFE/OCT, possible injection(s)  3,4. Hypertensive retinopathy OU - discussed importance of tight BP control - monitor   5. Mixed Cataract OU - The symptoms of cataract, surgical  options, and treatments and risks were discussed with patient. - discussed diagnosis and progression - not yet visually significant - monitor for now  Ophthalmic Meds Ordered this visit:  Meds ordered this encounter  Medications   prednisoLONE acetate (PRED FORTE) 1 % ophthalmic suspension    Sig: Place 1 drop into the left eye 4 (four) times daily for 7 days.    Dispense:  5 mL    Refill:  0     Return for f/u Nov. 23,  PDR OU, DFE, OCT.  There are no Patient Instructions on file for this visit.   Explained the diagnoses, plan, and follow up with the patient and they expressed understanding.  Patient expressed understanding of the importance of proper follow up care.   This document serves as a record of services personally performed by Gardiner Sleeper, MD, PhD. It was created on their behalf by San Jetty. Owens Shark, OA an ophthalmic technician. The creation of this record is the provider's dictation and/or activities during the visit.    Electronically signed by: San Jetty. Owens Shark, [place] 11.11.2022 1:13 PM  Gardiner Sleeper, M.D., Ph.D. Diseases & Surgery of the Retina and Vitreous [facility]  I have reviewed the above documentation for accuracy and completeness, and I agree with the above. Gardiner Sleeper, M.D., Ph.D. 10/10/21 1:13 PM   Abbreviations: M myopia (nearsighted); A astigmatism; H hyperopia (farsighted); P presbyopia; Mrx spectacle prescription;  CTL contact lenses; OD right eye; OS left eye; OU both eyes  XT exotropia; ET esotropia; PEK punctate epithelial keratitis; PEE punctate epithelial erosions; DES dry eye syndrome; MGD meibomian gland dysfunction; ATs artificial tears; PFAT's preservative free artificial tears; [place] nuclear sclerotic cataract; PSC posterior subcapsular cataract; ERM epi-retinal membrane; PVD posterior vitreous detachment; RD retinal detachment; DM diabetes mellitus; DR diabetic retinopathy; NPDR non-proliferative diabetic retinopathy;  PDR proliferative diabetic retinopathy; CSME clinically significant macular edema; DME diabetic macular edema; dbh dot blot hemorrhages; CWS cotton wool spot; POAG primary open angle glaucoma; C/D cup-to-disc ratio; HVF humphrey visual field; GVF goldmann visual field; OCT optical coherence tomography; IOP intraocular pressure; BRVO Branch retinal vein occlusion; CRVO central retinal vein occlusion; CRAO central retinal artery occlusion; BRAO branch retinal artery occlusion; RT retinal tear; SB scleral buckle; PPV pars plana vitrectomy; VH Vitreous hemorrhage; PRP panretinal laser photocoagulation; IVK intravitreal kenalog; VMT vitreomacular traction; MH Macular hole;  NVD neovascularization of the disc; NVE neovascularization elsewhere; AREDS age related eye disease study; ARMD age related macular degeneration; POAG primary open angle glaucoma; EBMD epithelial/anterior basement membrane dystrophy; ACIOL anterior chamber intraocular lens; IOL intraocular lens; PCIOL posterior chamber intraocular lens; Phaco/IOL phacoemulsification with intraocular lens placement; [place] photorefractive keratectomy; LASIK laser assisted in situ keratomileusis; HTN hypertension; DM diabetes mellitus; COPD chronic obstructive pulmonary disease

## 2021-10-10 ENCOUNTER — Ambulatory Visit (INDEPENDENT_AMBULATORY_CARE_PROVIDER_SITE_OTHER): Payer: No Typology Code available for payment source | Admitting: Ophthalmology

## 2021-10-10 ENCOUNTER — Encounter (INDEPENDENT_AMBULATORY_CARE_PROVIDER_SITE_OTHER): Payer: Self-pay | Admitting: Ophthalmology

## 2021-10-10 ENCOUNTER — Other Ambulatory Visit: Payer: Self-pay

## 2021-10-10 DIAGNOSIS — I10 Essential (primary) hypertension: Secondary | ICD-10-CM | POA: Diagnosis not present

## 2021-10-10 DIAGNOSIS — H3581 Retinal edema: Secondary | ICD-10-CM

## 2021-10-10 DIAGNOSIS — H25813 Combined forms of age-related cataract, bilateral: Secondary | ICD-10-CM

## 2021-10-10 DIAGNOSIS — E113513 Type 2 diabetes mellitus with proliferative diabetic retinopathy with macular edema, bilateral: Secondary | ICD-10-CM | POA: Diagnosis not present

## 2021-10-10 DIAGNOSIS — H35033 Hypertensive retinopathy, bilateral: Secondary | ICD-10-CM | POA: Diagnosis not present

## 2021-10-10 MED ORDER — PREDNISOLONE ACETATE 1 % OP SUSP: 1.0000 [drp] | Freq: Four times a day (QID) | OPHTHALMIC | 0 refills | Status: AC

## 2021-10-13 NOTE — Progress Notes (Signed)
[facility] Note  10/19/2021     CHIEF COMPLAINT Patient presents for Retina Follow Up  HISTORY OF PRESENT ILLNESS: Edward Riley is a 55 y.o. male who presents to the clinic today for:   HPI     Retina Follow Up   Patient presents with  Diabetic Retinopathy.  In both eyes.  This started 1 week ago.  I, the attending physician,  performed the HPI with the patient and updated documentation appropriately.        Comments   Patient here for 1 week retina follow up for PDR OU. Patient states vision no difference. No eye pain.       Last edited by Bernarda Caffey, MD on 10/19/2021  3:09 PM.      Referring physician: Luetta Nutting, DO [address]  [address] [place],  [place] [address]  HISTORICAL INFORMATION:   Selected notes from the MEDICAL RECORD NUMBER Referred by Dr. Luetta Nutting for diabetic eye exam LEE: [facility] Ocular Hx- PMH-    CURRENT MEDICATIONS: No current outpatient medications on file. (Ophthalmic Drugs)   No current facility-administered medications for this visit. (Ophthalmic Drugs)   Current Outpatient Medications (Other)  Medication Sig   AMBULATORY NON FORMULARY MEDICATION Bayer test strips and lancets. Use to check blood sugar twice a day. Dx: Type Two Diabetes Mellitus   amLODipine (NORVASC) 10 MG tablet TAKE 1 TABLET(10 MG) BY MOUTH DAILY   BD PEN NEEDLE NANO U/F 32G X 4 MM MISC USE AS DIRECTED   Blood Glucose Monitoring Suppl (ONE TOUCH ULTRA 2) w/Device KIT Use to check blood sugar twice a day. Send strips and lancets, quantity 100 refills 3.  Dx: Type Two Diabetes Mellitus   Continuous Blood Gluc Sensor (FREESTYLE LIBRE SENSOR SYSTEM) MISC Use as directed. Change device every 2 weeks   Dapagliflozin-metFORMIN HCl ER (XIGDUO XR) 08-999 MG TB24 Take 1 tablet by mouth daily.   glucose blood (ONE TOUCH ULTRA TEST) test strip Use as instructed   hydrochlorothiazide (HYDRODIURIL) 25 MG  tablet TAKE 1 TABLET(25 MG) BY MOUTH DAILY   Insulin Pen Needle (PEN NEEDLES) 31G X 8 MM MISC Use the pen needles for both the insulin and victoza pens daily   Lancet Device MISC Check blood sugar once or twice daily as needed.   lisinopril (ZESTRIL) 20 MG tablet Take 1 tablet (20 mg total) by mouth every morning.   pioglitazone (ACTOS) 45 MG tablet Take 1 tablet (45 mg total) by mouth daily.   rosuvastatin (CRESTOR) 20 MG tablet Take 1 tablet (20 mg total) by mouth daily.   Semaglutide (RYBELSUS) 7 MG TABS Take 7 mg by mouth daily. Start after completion of 30 days at 44m   tadalafil (CIALIS) 20 MG tablet Take 1 tablet (20 mg total) by mouth every other day.   insulin glargine, 1 Unit Dial, (TOUJEO SOLOSTAR) 300 UNIT/ML Solostar Pen Inject 30 Units into the skin daily.   Semaglutide (RYBELSUS) 3 MG TABS Take 3 mg by mouth daily.   No current facility-administered medications for this visit. (Other)   REVIEW OF SYSTEMS: ROS   Positive for: Endocrine, Cardiovascular, Eyes Negative for: Constitutional, Gastrointestinal, Neurological, Skin, Genitourinary, Musculoskeletal, HENT, Respiratory, Psychiatric, Allergic/Imm, Heme/Lymph Last edited by CTheodore Demark COA on 10/19/2021 10:03 AM.      ALLERGIES No Known Allergies  PAST MEDICAL HISTORY Past Medical History:  Diagnosis Date   Diabetes mellitus without complication ([place]  Hyperlipidemia    Hypertension    Obese    Retinal hemorrhage, both eyes    Sessile colonic polyp 02/04/2019   Colonoscopy February 2020.   History reviewed. No pertinent surgical history.  FAMILY HISTORY Family History  Problem Relation Age of Onset   Arthritis Mother    Hyperlipidemia Mother    Hypertension Mother    Cancer Father 64       Prostate (Metastatic) Lung, Esophagus, Stomach   Alcohol abuse Father    Kidney disease Brother    Hypertension Brother    Cancer Paternal Aunt        Breast Cancer   Alzheimer's disease Maternal Grandmother     Heart disease Paternal Grandmother    Diabetes Brother    Alcohol abuse Maternal Uncle    SOCIAL HISTORY Social History   Tobacco Use   Smoking status: Never   Smokeless tobacco: Never  Vaping Use   Vaping Use: Never used  Substance Use Topics   Alcohol use: Yes    Comment: Rarely   Drug use: No       OPHTHALMIC EXAM: Base Eye Exam     Visual Acuity (Snellen - Linear)       Right Left   Dist cc 20/25 -2 20/30   Dist ph cc NI 20/25 -1    Correction: Glasses         Tonometry (Tonopen, 10:01 AM)       Right Left   Pressure 20 16         Pupils       Dark Light Shape React APD   Right 3 2 Round Minimal None   Left 3 2 Round Minimal None         Visual Fields (Counting fingers)       Left Right    Full Full         Extraocular Movement       Right Left    Full, Ortho Full, Ortho         Neuro/Psych     Oriented x3: Yes   Mood/Affect: Normal         Dilation     Both eyes: 1.0% Mydriacyl, 2.5% Phenylephrine @ 10:01 AM           Slit Lamp and Fundus Exam     Slit Lamp Exam       Right Left   Lids/Lashes Dermatochalasis - upper lid Dermatochalasis - upper lid   Conjunctiva/Sclera nasal pingeucula nasal and temporal pinguecula   Cornea trace PEE trace PEE   Anterior Chamber Deep and quiet Deep and quiet   Iris Round and dilated, No NVI Round and dilated, No NVI   Lens 2+ Nuclear sclerosis, 2+ Cortical cataract 2+ Nuclear sclerosis, 2+ Cortical cataract   Anterior Vitreous Mild Vitreous syneresis Mild Vitreous syneresis         Fundus Exam       Right Left   Disc Pink and Sharp, PPA, Compact mild tilt, Pink and Sharp, PPA   C/D Ratio 0.2 0.2   Macula good foveal reflex, trace MA, focal exudates superior macula -- improved, +perifoveal cystic changes --improved Good foveal reflex, Microaneurysms -- improved, Cystic changes temporal to fovea -- improved   Vessels mild attenuation, mild Copper wiring, mild AV crossing  changes, ?NV temporal periphery mild attenuation, mild Copper wiring, mild AV crossing changes, trace focal fibrosis along distal IT arcade, ?NV temporal periphery   Periphery Attached, 360 PRP  with good laser fill in, pre-retinal heme temporal periphery improved Attached, 360 PRP with good for fill in, scattered IRH/DBH           Refraction     Wearing Rx       Sphere Cylinder Axis   Right -2.50 +1.00 138   Left -2.25 +1.00 173           IMAGING AND PROCEDURES  Imaging and Procedures for 10/19/2021  OCT, Retina - OU - Both Eyes       Right Eye Quality was good. Central Foveal Thickness: 443. Progression has improved. Findings include abnormal foveal contour, intraretinal fluid, no SRF, vitreomacular adhesion (Interval improvement in IRF and foveal contour).   Left Eye Quality was good. Central Foveal Thickness: 356. Progression has improved. Findings include normal foveal contour, no SRF, intraretinal fluid, vitreomacular adhesion (Mild interval improvement in IRF/cystic changes temporal macula).   Notes *Images captured and stored on drive  Diagnosis / Impression:  +DME OU OD: Interval improvement in IRF and foveal contour OS: Mild interval improvement in IRF/cystic changes temporal macula  Clinical management:  See below  Abbreviations: NFP - Normal foveal profile. CME - cystoid macular edema. PED - pigment epithelial detachment. IRF - intraretinal fluid. SRF - subretinal fluid. EZ - ellipsoid zone. ERM - epiretinal membrane. ORA - outer retinal atrophy. ORT - outer retinal tubulation. SRHM - subretinal hyper-reflective material. IRHM - intraretinal hyper-reflective material      Intravitreal Injection, Pharmacologic Agent - OD - Right Eye       Time Out 10/19/2021. 10:52 AM. Confirmed correct patient, procedure, site, and patient consented.   Anesthesia Topical anesthesia was used. Anesthetic medications included Lidocaine 2%, Proparacaine 0.5%.    Procedure Preparation included 5% betadine to ocular surface, eyelid speculum. A supplied needle was used.   Injection: 1.25 mg Bevacizumab 1.61m/0.05ml   Route: Intravitreal, Site: Right Eye   NDC: 50242-060-01, Lot: [id number] Expiration date: 12/06/2021, Waste: 0 mL   Post-op Post injection exam found visual acuity of at least counting fingers. The patient tolerated the procedure well. There were no complications. The patient received written and verbal post procedure care education. Post injection medications were not given.      Intravitreal Injection, Pharmacologic Agent - OS - Left Eye       Time Out 10/19/2021. 10:53 AM. Confirmed correct patient, procedure, site, and patient consented.   Anesthesia Topical anesthesia was used. Anesthetic medications included Lidocaine 2%, Proparacaine 0.5%.   Procedure Preparation included 5% betadine to ocular surface, eyelid speculum. A (32 g) needle was used.   Injection: 1.25 mg Bevacizumab 1.251m0.05ml   Route: Intravitreal, Site: Left Eye   NDC: : 72536-644-03Lot: [id number], Expiration date: 11/30/2021, Waste: 0.05 mL   Post-op Post injection exam found visual acuity of at least counting fingers. The patient tolerated the procedure well. There were no complications. The patient received written and verbal post procedure care education.             ASSESSMENT/PLAN:   ICD-10-CM   1. Proliferative diabetic retinopathy of both eyes with macular edema associated with type 2 diabetes mellitus (HCC)  [id number] Injection, Pharmacologic Agent - OD - Right Eye    Intravitreal Injection, Pharmacologic Agent - OS - Left Eye    Bevacizumab (AVASTIN) SOLN 1.25 mg    Bevacizumab (AVASTIN) SOLN 1.25 mg    2. Retinal edema  H35.81 OCT, Retina - OU - Both Eyes    3. Essential hypertension  I10  4. Hypertensive retinopathy of both eyes  H35.033     5. Combined forms of age-related cataract of both eyes  H25.813        1,2. 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, OU (OD > OS)  - pt with history of PRP laser OU in [place] ~10+ yrs ago -- pt can't remember provider - exam shows scattered MA/IRH and 360 PRP, room for fill in OU - FA (9.19.22) shows late-leaking MA, vascular nonperfusion, +NV OU -- will need PRP fill in OU - s/p IVA OD #1 (09.19.22) - s/p IVA OS #1 (09.23.22) - s/p IVA OU #2 (10.21.22) - s/p PRP OD (09.29.22) - s/p PRP OS (11.14.22) - BCVA 20/25 OU - OCT shows OD: Interval improvement in IRF and foveal contour; OS: Mild interval improvement in IRF/cystic changes temporal macula - recommend IVA OU #3 today, 11.23.22 for DME - pt wishes to proceed  - RBA of procedure discussed, questions answered - informed consent obtained and signed - see procedure notes - f/u 4 weeks DFE/OCT, possible injection(s)  3,4. Hypertensive retinopathy OU - discussed importance of tight BP control - monitor   5. Mixed Cataract OU - The symptoms of cataract, surgical options, and treatments and risks were discussed with patient. - discussed diagnosis and progression - not yet visually significant - monitor for now  Ophthalmic Meds Ordered this visit:  Meds ordered this encounter  Medications   Bevacizumab (AVASTIN) SOLN 1.25 mg   Bevacizumab (AVASTIN) SOLN 1.25 mg      Return in about 4 weeks (around 11/16/2021) for f/u PDR OU, DFE, OCT.  There are no Patient Instructions on file for this visit.   Explained the diagnoses, plan, and follow up with the patient and they expressed understanding.  Patient expressed understanding of the importance of proper follow up care.   This document serves as a record of services personally performed by Gardiner Sleeper, MD, PhD. It was created on their behalf by Orvan Falconer, an ophthalmic technician. The creation of this record is the provider's dictation and/or activities during the visit.    Electronically signed by: Orvan Falconer, OA,  10/19/21  3:14 PM  This document serves as a record of services personally performed by Gardiner Sleeper, MD, PhD. It was created on their behalf by San Jetty. Owens Shark, OA an ophthalmic technician. The creation of this record is the provider's dictation and/or activities during the visit.    Electronically signed by: San Jetty. Owens Shark, [place] 11.23.2022 3:14 PM   Gardiner Sleeper, M.D., Ph.D. Diseases & Surgery of the Retina and Vitreous [facility]  I have reviewed the above documentation for accuracy and completeness, and I agree with the above. Gardiner Sleeper, M.D., Ph.D. 10/19/21 3:17 PM   Abbreviations: M myopia (nearsighted); A astigmatism; H hyperopia (farsighted); P presbyopia; Mrx spectacle prescription;  CTL contact lenses; OD right eye; OS left eye; OU both eyes  XT exotropia; ET esotropia; PEK punctate epithelial keratitis; PEE punctate epithelial erosions; DES dry eye syndrome; MGD meibomian gland dysfunction; ATs artificial tears; PFAT's preservative free artificial tears; [place] nuclear sclerotic cataract; PSC posterior subcapsular cataract; ERM epi-retinal membrane; PVD posterior vitreous detachment; RD retinal detachment; DM diabetes mellitus; DR diabetic retinopathy; NPDR non-proliferative diabetic retinopathy; PDR proliferative diabetic retinopathy; CSME clinically significant macular edema; DME diabetic macular edema; dbh dot blot hemorrhages; CWS cotton wool spot; POAG primary open angle glaucoma; C/D cup-to-disc ratio; HVF humphrey visual field; GVF goldmann visual field; OCT optical  coherence tomography; IOP intraocular pressure; BRVO Branch retinal vein occlusion; CRVO central retinal vein occlusion; CRAO central retinal artery occlusion; BRAO branch retinal artery occlusion; RT retinal tear; SB scleral buckle; PPV pars plana vitrectomy; VH Vitreous hemorrhage; PRP panretinal laser photocoagulation; IVK intravitreal kenalog; VMT vitreomacular traction; MH Macular  hole;  NVD neovascularization of the disc; NVE neovascularization elsewhere; AREDS age related eye disease study; ARMD age related macular degeneration; POAG primary open angle glaucoma; EBMD epithelial/anterior basement membrane dystrophy; ACIOL anterior chamber intraocular lens; IOL intraocular lens; PCIOL posterior chamber intraocular lens; Phaco/IOL phacoemulsification with intraocular lens placement; [place] photorefractive keratectomy; LASIK laser assisted in situ keratomileusis; HTN hypertension; DM diabetes mellitus; COPD chronic obstructive pulmonary disease

## 2021-10-19 ENCOUNTER — Other Ambulatory Visit: Payer: Self-pay

## 2021-10-19 ENCOUNTER — Ambulatory Visit (INDEPENDENT_AMBULATORY_CARE_PROVIDER_SITE_OTHER): Payer: No Typology Code available for payment source | Admitting: Ophthalmology

## 2021-10-19 ENCOUNTER — Encounter (INDEPENDENT_AMBULATORY_CARE_PROVIDER_SITE_OTHER): Payer: Self-pay | Admitting: Ophthalmology

## 2021-10-19 DIAGNOSIS — I10 Essential (primary) hypertension: Secondary | ICD-10-CM | POA: Diagnosis not present

## 2021-10-19 DIAGNOSIS — H25813 Combined forms of age-related cataract, bilateral: Secondary | ICD-10-CM | POA: Diagnosis not present

## 2021-10-19 DIAGNOSIS — H35033 Hypertensive retinopathy, bilateral: Secondary | ICD-10-CM | POA: Diagnosis not present

## 2021-10-19 DIAGNOSIS — H3581 Retinal edema: Secondary | ICD-10-CM

## 2021-10-19 DIAGNOSIS — E113513 Type 2 diabetes mellitus with proliferative diabetic retinopathy with macular edema, bilateral: Secondary | ICD-10-CM | POA: Diagnosis not present

## 2021-10-19 MED ORDER — BEVACIZUMAB CHEMO INJECTION 1.25MG/0.05ML SYRINGE FOR KALEIDOSCOPE
1.2500 mg | INTRAVITREAL | Status: AC | PRN
Start: 2021-10-19 — End: 2021-10-19
  Administered 2021-10-19: 1.25 mg via INTRAVITREAL

## 2021-10-19 MED ORDER — BEVACIZUMAB CHEMO INJECTION 1.25MG/0.05ML SYRINGE FOR KALEIDOSCOPE
1.2500 mg | INTRAVITREAL | Status: AC | PRN
  Administered 2021-10-19: 1.25 mg via INTRAVITREAL

## 2021-10-23 ENCOUNTER — Encounter: Payer: Self-pay | Admitting: Family Medicine

## 2021-10-24 ENCOUNTER — Other Ambulatory Visit: Payer: Self-pay | Admitting: Family Medicine

## 2021-10-26 ENCOUNTER — Encounter: Payer: Self-pay | Admitting: Family Medicine

## 2021-10-26 ENCOUNTER — Other Ambulatory Visit: Payer: Self-pay

## 2021-10-26 ENCOUNTER — Ambulatory Visit (INDEPENDENT_AMBULATORY_CARE_PROVIDER_SITE_OTHER): Payer: No Typology Code available for payment source | Admitting: Family Medicine

## 2021-10-26 VITALS — BP 131/67 | HR 76 | Temp 98.0°F | Ht 68.0 in | Wt 256.0 lb

## 2021-10-26 DIAGNOSIS — J029 Acute pharyngitis, unspecified: Secondary | ICD-10-CM | POA: Diagnosis not present

## 2021-10-26 DIAGNOSIS — Z23 Encounter for immunization: Secondary | ICD-10-CM

## 2021-10-26 MED ORDER — PREDNISONE 20 MG PO TABS: 40.0000 mg | ORAL_TABLET | Freq: Every day | ORAL | 0 refills | Status: DC

## 2021-10-26 NOTE — Progress Notes (Signed)
Acute Office Visit  Subjective:    Patient ID: Edward Riley, male    DOB: [birth year], 55 y.o.   MRN: [id number]  Chief Complaint  Patient presents with   Sore Throat   Ear Pain    X 1 day    HPI Patient is in today for ear pain and sore throat.  He says about 3 days ago he started getting a sore throat and some congestion.  He says the congestion is a little bit better today he still moving a little bit out of his throat area but the sore throat is still there.  He says it is very sore but then it feels little bit better if he drinks something hot or if he eats a little bit or does his lozenges.  He has been using a little bit of DayQuil and says that helps as well.  He says driving back last night it was so painful that he was almost in tears but says it feels different than when he has had strep throat.  He denies any cough.  No fevers or chills.  Past Medical History:  Diagnosis Date   Diabetes mellitus without complication ([place])    Hyperlipidemia    Hypertension    Obese    Retinal hemorrhage, both eyes    Sessile colonic polyp 02/04/2019   Colonoscopy February 2020.    No past surgical history on file.  Family History  Problem Relation Age of Onset   Arthritis Mother    Hyperlipidemia Mother    Hypertension Mother    Cancer Father 18       Prostate (Metastatic) Lung, Esophagus, Stomach   Alcohol abuse Father    Kidney disease Brother    Hypertension Brother    Cancer Paternal Aunt        Breast Cancer   Alzheimer's disease Maternal Grandmother    Heart disease Paternal Grandmother    Diabetes Brother    Alcohol abuse Maternal Uncle     Social History   Socioeconomic History   Marital status: Single    Spouse name: Not on file   Number of children: Not on file   Years of education: 12   Highest education level: Not on file  Occupational History   Occupation: TRUCK DRIVER     Employer: other - Aeronautical engineer    Comment: [employer]  Tobacco Use    Smoking status: Never   Smokeless tobacco: Never  Vaping Use   Vaping Use: Never used  Substance and Sexual Activity   Alcohol use: Yes    Comment: Rarely   Drug use: No   Sexual activity: Yes  Other Topics Concern   Not on file  Social History Narrative   Marital Status: Single    Children: None   Pets: None    Living Situation: Lives alone   Occupation: Administrator Printmaker)     Education:  12 th Grade    Tobacco Use/Exposure:  None    Alcohol Use:  Rarely   Drug Use:  None   Diet:  Regular   Exercise:  Very Active at work    Hobbies:  Merchant navy officer                Social Determinants of Radio broadcast assistant Strain: Not on file  Food Insecurity: Not on file  Transportation Needs: Not on file  Physical Activity: Not on file  Stress: Not on file  Social Connections:  Not on file  Intimate Partner Violence: Not on file    Outpatient Medications Prior to Visit  Medication Sig Dispense Refill   AMBULATORY NON FORMULARY MEDICATION Bayer test strips and lancets. Use to check blood sugar twice a day. Dx: Type Two Diabetes Mellitus 100 Units 11   amLODipine (NORVASC) 10 MG tablet TAKE 1 TABLET(10 MG) BY MOUTH DAILY 90 tablet 3   BD PEN NEEDLE NANO U/F 32G X 4 MM MISC USE AS DIRECTED 200 each 2   Blood Glucose Monitoring Suppl (ONE TOUCH ULTRA 2) w/Device KIT Use to check blood sugar twice a day. Send strips and lancets, quantity 100 refills 3.  Dx: Type Two Diabetes Mellitus 1 each 0   Continuous Blood Gluc Sensor ([place]) MISC Use as directed. Change device every 2 weeks 1 each 12   glucose blood (ONE TOUCH ULTRA TEST) test strip Use as instructed 100 each 12   hydrochlorothiazide (HYDRODIURIL) 25 MG tablet TAKE 1 TABLET(25 MG) BY MOUTH DAILY 90 tablet 3   insulin glargine, 1 Unit Dial, (TOUJEO SOLOSTAR) 300 UNIT/ML Solostar Pen Inject 30 Units into the skin daily. 6 mL 2   Insulin Pen Needle (PEN NEEDLES) 31G X 8 MM MISC Use  the pen needles for both the insulin and victoza pens daily 100 each 12   Lancet Device MISC Check blood sugar once or twice daily as needed. 100 each prn   lisinopril (ZESTRIL) 20 MG tablet Take 1 tablet (20 mg total) by mouth every morning. 90 tablet 3   pioglitazone (ACTOS) 45 MG tablet Take 1 tablet (45 mg total) by mouth daily. 90 tablet 3   rosuvastatin (CRESTOR) 20 MG tablet Take 1 tablet (20 mg total) by mouth daily. 90 tablet 3   Semaglutide (RYBELSUS) 7 MG TABS Take 7 mg by mouth daily. Start after completion of 30 days at 108m 30 tablet 3   tadalafil (CIALIS) 20 MG tablet Take 1 tablet (20 mg total) by mouth every other day. 90 tablet 1   XIGDUO XR 08-999 MG TB24 TAKE ONE TABLET BY MOUTH DAILY 30 tablet 2   Semaglutide (RYBELSUS) 3 MG TABS Take 3 mg by mouth daily. 30 tablet 0   No facility-administered medications prior to visit.    No Known Allergies  Review of Systems     Objective:    Physical Exam Constitutional:      Appearance: He is well-developed.  HENT:     Head: Normocephalic and atraumatic.     Right Ear: Tympanic membrane, ear canal and external ear normal.     Left Ear: Tympanic membrane, ear canal and external ear normal.     Nose: Nose normal. No congestion.     Mouth/Throat:     Mouth: Mucous membranes are moist.     Pharynx: Oropharynx is clear. No pharyngeal swelling.     Tonsils: No tonsillar exudate or tonsillar abscesses.  Eyes:     Conjunctiva/sclera: Conjunctivae normal.     Pupils: Pupils are equal, round, and reactive to light.  Neck:     Thyroid: No thyromegaly.  Cardiovascular:     Rate and Rhythm: Normal rate.     Heart sounds: Normal heart sounds.  Pulmonary:     Effort: Pulmonary effort is normal.     Breath sounds: Normal breath sounds.  Musculoskeletal:     Cervical back: Neck supple.  Lymphadenopathy:     Cervical: No cervical adenopathy.  Skin:    General: Skin is  warm and dry.  Neurological:     Mental Status: He is  alert and oriented to person, place, and time.    BP 131/67   Pulse 76   Temp 98 F (36.7 C)   Ht '5\' 8"'  (1.727 m)   Wt 256 lb (116.1 kg)   SpO2 99%   BMI 38.92 kg/m  Wt Readings from Last 3 Encounters:  10/26/21 256 lb (116.1 kg)  07/04/21 258 lb (117 kg)  02/28/21 253 lb (114.8 kg)    Health Maintenance Due  Topic Date Due   Pneumococcal Vaccine 25-58 Years old (2 - PCV) 11/17/2017   OPHTHALMOLOGY EXAM  12/21/2019   COVID-19 Vaccine (2 - Booster for Janssen series) 05/01/2020   Zoster Vaccines- Shingrix (2 of 2) 08/29/2021    There are no preventive care reminders to display for this patient.   Lab Results  Component Value Date   TSH 0.54 06/02/2016   Lab Results  Component Value Date   WBC 8.6 07/04/2021   HGB 15.6 07/04/2021   HCT 46.9 07/04/2021   MCV 92.5 07/04/2021   PLT 217 07/04/2021   Lab Results  Component Value Date   NA 138 07/04/2021   K 4.9 07/04/2021   CO2 30 07/04/2021   GLUCOSE 205 (H) 07/04/2021   BUN 31 (H) 07/04/2021   CREATININE 1.26 07/04/2021   BILITOT 0.5 07/04/2021   ALKPHOS 52 11/17/2016   AST 15 07/04/2021   ALT 15 07/04/2021   PROT 7.6 07/04/2021   ALBUMIN 4.6 11/17/2016   CALCIUM 10.4 (H) 07/04/2021   EGFR 68 07/04/2021   Lab Results  Component Value Date   CHOL 176 07/04/2021   Lab Results  Component Value Date   HDL 40 07/04/2021   Lab Results  Component Value Date   LDLCALC 111 (H) 07/04/2021   Lab Results  Component Value Date   TRIG 139 07/04/2021   Lab Results  Component Value Date   CHOLHDL 4.4 07/04/2021   Lab Results  Component Value Date   HGBA1C 9.5 (H) 07/04/2021       Assessment & Plan:   Problem List Items Addressed This Visit   None Visit Diagnoses     Viral pharyngitis    -  Primary   Need for immunization against influenza       Relevant Orders   Flu Vaccine QUAD 66moIM (Fluarix, Fluzone & Alfiuria Quad PF) (Completed)      Viral pharyngitis-really do not think this is strep  throat as he is also had concomitant nasal congestion.  So we does discussed maybe doing a short course of prednisone since the throat feels extremely sore..  Recommend continue with salt water gargles and lozenges okay to use DayQuil.  He does have a follow-up with his PCP on Monday so if he is not feeling better they can address it at that time.  We also discussed that his blood pressure is a little above optimal and encouraged him to discuss that with his PCP on Monday as well.  He says he actually really has been working on changing his diet and cutting out MColgatethis week.  Meds ordered this encounter  Medications   predniSONE (DELTASONE) 20 MG tablet    Sig: Take 2 tablets (40 mg total) by mouth daily with breakfast.    Dispense:  10 tablet    Refill:  0     CBeatrice Lecher MD

## 2021-10-26 NOTE — Progress Notes (Signed)
Pt reports that he has been experiencing sxs since Monday. Bilateral ear pain x 1 day.

## 2021-10-31 ENCOUNTER — Encounter: Payer: Self-pay | Admitting: Family Medicine

## 2021-10-31 ENCOUNTER — Other Ambulatory Visit: Payer: Self-pay

## 2021-10-31 ENCOUNTER — Ambulatory Visit (INDEPENDENT_AMBULATORY_CARE_PROVIDER_SITE_OTHER): Payer: No Typology Code available for payment source | Admitting: Family Medicine

## 2021-10-31 VITALS — BP 125/68 | HR 78 | Temp 97.0°F | Ht 68.0 in | Wt 249.0 lb

## 2021-10-31 DIAGNOSIS — E1121 Type 2 diabetes mellitus with diabetic nephropathy: Secondary | ICD-10-CM | POA: Diagnosis not present

## 2021-10-31 DIAGNOSIS — E785 Hyperlipidemia, unspecified: Secondary | ICD-10-CM

## 2021-10-31 DIAGNOSIS — E113553 Type 2 diabetes mellitus with stable proliferative diabetic retinopathy, bilateral: Secondary | ICD-10-CM

## 2021-10-31 DIAGNOSIS — E1159 Type 2 diabetes mellitus with other circulatory complications: Secondary | ICD-10-CM | POA: Diagnosis not present

## 2021-10-31 DIAGNOSIS — E1169 Type 2 diabetes mellitus with other specified complication: Secondary | ICD-10-CM

## 2021-10-31 DIAGNOSIS — I152 Hypertension secondary to endocrine disorders: Secondary | ICD-10-CM

## 2021-10-31 DIAGNOSIS — Z794 Long term (current) use of insulin: Secondary | ICD-10-CM

## 2021-10-31 DIAGNOSIS — Z23 Encounter for immunization: Secondary | ICD-10-CM

## 2021-10-31 LAB — POCT GLYCOSYLATED HEMOGLOBIN (HGB A1C): HbA1c, POC (controlled diabetic range): 7.9 % — AB (ref 0.0–7.0)

## 2021-10-31 MED ORDER — TIRZEPATIDE 5 MG/0.5ML ~~LOC~~ SOAJ: 5.0000 mg | SUBCUTANEOUS | 0 refills | Status: DC

## 2021-10-31 MED ORDER — TIRZEPATIDE 2.5 MG/0.5ML ~~LOC~~ SOAJ: 2.5000 mg | SUBCUTANEOUS | 0 refills | Status: DC

## 2021-10-31 MED ORDER — TIRZEPATIDE 10 MG/0.5ML ~~LOC~~ SOAJ: 10.0000 mg | SUBCUTANEOUS | 0 refills | Status: DC

## 2021-10-31 MED ORDER — TIRZEPATIDE 7.5 MG/0.5ML ~~LOC~~ SOAJ: 7.5000 mg | SUBCUTANEOUS | 0 refills | Status: DC

## 2021-10-31 NOTE — Assessment & Plan Note (Signed)
Followed by ophthalmology.   

## 2021-10-31 NOTE — Progress Notes (Signed)
Edward Riley - 55 y.o. male MRN [id number]  Date of birth: 10/01/1966  Subjective Chief Complaint  Patient presents with   Diabetes   Hypertension    HPI Edward Riley is a 55 year old male here today for follow-up visit.  He reports that he is doing well.  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  He has been working on dietary change that with his blood sugars.  In addition to insulin he is taking Rybelsus, Actos and Xigduo.  He does not monitor blood sugars at home regularly.  Blood pressures have been fairly well controlled with combination of lisinopril, hydrochlorothiazide and amlodipine.  No side effects with this.  He denies chest pain, shortness of breath, palpitations, headache or Vision changes.  ROS:  A comprehensive ROS was completed and negative except as noted per HPI  No Known Allergies  Past Medical History:  Diagnosis Date   Diabetes mellitus without complication (HCC)    Hyperlipidemia    Hypertension    Obese    Retinal hemorrhage, both eyes    Sessile colonic polyp 02/04/2019   Colonoscopy February 2020.    History reviewed. No pertinent surgical history.  Social History   Socioeconomic History   Marital status: Single    Spouse name: Not on file   Number of children: Not on file   Years of education: 12   Highest education level: Not on file  Occupational History   Occupation: TRUCK DRIVER     Employer: other - Physiological scientist    Comment: [employer]  Tobacco Use   Smoking status: Never   Smokeless tobacco: Never  Vaping Use   Vaping Use: Never used  Substance and Sexual Activity   Alcohol use: Yes    Comment: Rarely   Drug use: No   Sexual activity: Yes  Other Topics Concern   Not on file  Social History Narrative   Marital Status: Single    Children: None   Pets: None    Living Situation: Lives alone   Occupation: Naval architect Research officer, trade union)     Education:  12 th Grade    Tobacco  Use/Exposure:  None    Alcohol Use:  Rarely   Drug Use:  None   Diet:  Regular   Exercise:  Very Active at work    Hobbies:  Teacher, early years/pre                Social Determinants of Corporate investment banker Strain: Not on file  Food Insecurity: Not on file  Transportation Needs: Not on file  Physical Activity: Not on file  Stress: Not on file  Social Connections: Not on file    Family History  Problem Relation Age of Onset   Arthritis Mother    Hyperlipidemia Mother    Hypertension Mother    Cancer Father 49       Prostate (Metastatic) Lung, Esophagus, Stomach   Alcohol abuse Father    Kidney disease Brother    Hypertension Brother    Cancer Paternal Aunt        Breast Cancer   Alzheimer's disease Maternal Grandmother    Heart disease Paternal Grandmother    Diabetes Brother    Alcohol abuse Maternal Uncle     Health Maintenance  Topic Date Due   OPHTHALMOLOGY EXAM  12/21/2019   COVID-19 Vaccine (2 - Booster for Janssen series) 05/01/2020   Zoster Vaccines- Shingrix (2 of 2) 08/29/2021  Hepatitis C Screening  07/04/2022 (Originally 04/04/1986)   HIV Screening  07/04/2022 (Originally 04/05/1983)   HEMOGLOBIN A1C  05/01/2022   FOOT EXAM  07/04/2022   TETANUS/TDAP  11/17/2026   COLONOSCOPY (Pts 45-47yrs Insurance coverage will need to be confirmed)  01/24/2029   Pneumococcal Vaccine 12-22 Years old  Completed   INFLUENZA VACCINE  Completed   HPV VACCINES  Aged Out     ----------------------------------------------------------------------------------------------------------------------------------------------------------------------------------------------------------------- Physical Exam BP 125/68 (BP Location: Left Arm, Patient Position: Sitting, Cuff Size: Large)   Pulse 78   Temp (!) 97 F (36.1 C)   Ht 5\' 8"  (1.727 m)   Wt 249 lb (112.9 kg)   SpO2 99%   BMI 37.86 kg/m   Physical Exam Constitutional:      Appearance: Normal appearance.  HENT:      Head: Normocephalic and atraumatic.  Eyes:     General: No scleral icterus. Cardiovascular:     Rate and Rhythm: Normal rate and regular rhythm.  Pulmonary:     Effort: Pulmonary effort is normal.     Breath sounds: Normal breath sounds.  Musculoskeletal:     Cervical back: Neck supple.  Neurological:     General: No focal deficit present.     Mental Status: He is alert.  Psychiatric:        Mood and Affect: Mood normal.        Behavior: Behavior normal.    ------------------------------------------------------------------------------------------------------------------------------------------------------------------------------------------------------------------- Assessment and Plan  Hypertension associated with diabetes (HCC) Blood pressure remains well controlled.  Recommend continuation of current antihypertensive medications.  Low-sodium diet encouraged.  Diabetes with retinopathy (HCC) Followed by ophthalmology.  Hyperlipidemia associated with type 2 diabetes mellitus (HCC) Doing well with Crestor.  Continue current strength.  Type 2 diabetes mellitus with diabetic nephropathy Blood sugars have improved but are not well controlled.  He wishes to discontinue insulin.  Discontinue Rybelsus and Toujeo.  Adding Mounjaro and continue to work on dietary change.  We discussed that if blood sugars do not continue to improve we will need to add insulin back on.   Meds ordered this encounter  Medications   tirzepatide (MOUNJARO) 2.5 MG/0.5ML Pen    Sig: Inject 2.5 mg into the skin once a week. Take for 28 days and increase to 5mg     Dispense:  2 mL    Refill:  0   tirzepatide (MOUNJARO) 5 MG/0.5ML Pen    Sig: Inject 5 mg into the skin once a week. Take for 28 days and increase to 7.5mg     Dispense:  2 mL    Refill:  0   tirzepatide (MOUNJARO) 7.5 MG/0.5ML Pen    Sig: Inject 7.5 mg into the skin once a week. Take for 28 days and increase to 10mg     Dispense:  2 mL     Refill:  0   tirzepatide (MOUNJARO) 10 MG/0.5ML Pen    Sig: Inject 10 mg into the skin once a week.    Dispense:  6 mL    Refill:  0    Return in about 3 months (around 01/29/2022) for DM.    This visit occurred during the SARS-CoV-2 public health emergency.  Safety protocols were in place, including screening questions prior to the visit, additional usage of staff PPE, and extensive cleaning of exam room while observing appropriate contact time as indicated for disinfecting solutions.

## 2021-10-31 NOTE — Assessment & Plan Note (Signed)
Blood sugars have improved but are not well controlled.  He wishes to discontinue insulin.  Discontinue Rybelsus and Toujeo.  Adding Mounjaro and continue to work on dietary change.  We discussed that if blood sugars do not continue to improve we will need to add insulin back on.

## 2021-10-31 NOTE — Patient Instructions (Signed)
Discontinue Rybelsus.  Start Mounjaro, increase monthly as directed.  Follow up in 3-4  months.

## 2021-10-31 NOTE — Assessment & Plan Note (Signed)
Blood pressure remains well controlled.  Recommend continuation of current antihypertensive medications.  Low-sodium diet encouraged.

## 2021-10-31 NOTE — Assessment & Plan Note (Signed)
Doing well with Crestor.  Continue current strength.

## 2021-11-01 ENCOUNTER — Telehealth: Payer: Self-pay

## 2021-11-01 NOTE — Telephone Encounter (Signed)
Medication: tirzepatide Gregg[facility] Keen) 2.5 MG/0.5ML Pen Prior authorization submitted via CoverMyMeds on 11/01/2021 PA submission pending

## 2021-11-14 NOTE — Progress Notes (Shared)
[facility] Note  11/18/2021     CHIEF COMPLAINT Patient presents for No chief complaint on file.   HISTORY OF PRESENT ILLNESS: Edward Riley is a 55 y.o. male who presents to the clinic today for:      Referring physician: Luetta Nutting, DO [address]  [address] [place],   [id number]  HISTORICAL INFORMATION:   Selected notes from the MEDICAL RECORD NUMBER Referred by Dr. Luetta Nutting for diabetic eye exam LEE: [facility] Ocular Hx- PMH-    CURRENT MEDICATIONS: No current outpatient medications on file. (Ophthalmic Drugs)   No current facility-administered medications for this visit. (Ophthalmic Drugs)   Current Outpatient Medications (Other)  Medication Sig   AMBULATORY NON FORMULARY MEDICATION Bayer test strips and lancets. Use to check blood sugar twice a day. Dx: Type Two Diabetes Mellitus   amLODipine (NORVASC) 10 MG tablet TAKE 1 TABLET(10 MG) BY MOUTH DAILY   BD PEN NEEDLE NANO U/F 32G X 4 MM MISC USE AS DIRECTED   Blood Glucose Monitoring Suppl (ONE TOUCH ULTRA 2) w/Device KIT Use to check blood sugar twice a day. Send strips and lancets, quantity 100 refills 3.  Dx: Type Two Diabetes Mellitus   glucose blood (ONE TOUCH ULTRA TEST) test strip Use as instructed   hydrochlorothiazide (HYDRODIURIL) 25 MG tablet TAKE 1 TABLET(25 MG) BY MOUTH DAILY   Insulin Pen Needle (PEN NEEDLES) 31G X 8 MM MISC Use the pen needles for both the insulin and victoza pens daily   Lancet Device MISC Check blood sugar once or twice daily as needed.   lisinopril (ZESTRIL) 20 MG tablet Take 1 tablet (20 mg total) by mouth every morning.   pioglitazone (ACTOS) 45 MG tablet Take 1 tablet (45 mg total) by mouth daily.   predniSONE (DELTASONE) 20 MG tablet Take 2 tablets (40 mg total) by mouth daily with breakfast.   rosuvastatin (CRESTOR) 20 MG tablet Take 1 tablet (20 mg total) by mouth daily.   tadalafil (CIALIS) 20 MG  tablet Take 1 tablet (20 mg total) by mouth every other day.   tirzepatide (MOUNJARO) 10 MG/0.5ML Pen Inject 10 mg into the skin once a week.   tirzepatide [facility]) 2.5 MG/0.5ML Pen Inject 2.5 mg into the skin once a week. Take for 28 days and increase to 28m   tirzepatide (MOUNJARO) 5 MG/0.5ML Pen Inject 5 mg into the skin once a week. Take for 28 days and increase to 7.56m  tirzepatide (MOUNJARO) 7.5 MG/0.5ML Pen Inject 7.5 mg into the skin once a week. Take for 28 days and increase to 1064m XIGDUO XR 08-999 MG TB24 TAKE ONE TABLET BY MOUTH DAILY   No current facility-administered medications for this visit. (Other)   REVIEW OF SYSTEMS:    ALLERGIES No Known Allergies  PAST MEDICAL HISTORY Past Medical History:  Diagnosis Date   Diabetes mellitus [place] complication [place]  Hyperlipidemia    Hypertension    Obese    Retinal hemorrhage, both eyes    Sessile colonic polyp 02/04/2019   Colonoscopy February 2020.   No past surgical history on file.  FAMILY HISTORY Family History  Problem Relation Age of Onset   Arthritis Mother    Hyperlipidemia Mother    Hypertension Mother    Cancer Father 60 72    Prostate (Metastatic) Lung, Esophagus, Stomach   Alcohol abuse Father    Kidney disease Brother  Hypertension Brother    Cancer Paternal Aunt        Breast Cancer   Alzheimer's disease Maternal Grandmother    Heart disease Paternal Grandmother    Diabetes Brother    Alcohol abuse Maternal Uncle    SOCIAL HISTORY Social History   Tobacco Use   Smoking status: Never   Smokeless tobacco: Never  Vaping Use   Vaping Use: Never used  Substance Use Topics   Alcohol use: Yes    Comment: Rarely   Drug use: No       OPHTHALMIC EXAM: Not recorded    IMAGING AND PROCEDURES  Imaging and Procedures for 11/18/2021           ASSESSMENT/PLAN:   ICD-10-CM   1. Proliferative diabetic retinopathy of both eyes with macular edema associated with type 2  diabetes mellitus ([place])  K34.9179     2. Retinal edema  H35.81     3. Essential hypertension  I10     4. Hypertensive retinopathy of both eyes  H35.033     5. Combined forms of age-related cataract of both eyes  H25.813        1,2. 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, OU (OD > OS)  - pt with history of PRP laser OU in [place] ~10+ yrs ago -- pt can't remember provider - exam shows scattered MA/IRH and 360 PRP, room for fill in OU - FA (9.19.22) shows late-leaking MA, vascular nonperfusion, +NV OU -- will need PRP fill in OU - s/p IVA OD #1 (09.19.22) - s/p IVA OS #1 (09.23.22) - s/p IVA OU #2 (10.21.22), #3 (11.23.22) - s/p PRP OD (09.29.22) - s/p PRP OS (11.14.22) - BCVA 20/25 OU - OCT shows OD: Interval improvement in IRF and foveal contour; OS: Mild interval improvement in IRF/cystic changes temporal macula - recommend IVA OU #4 today, 12.23.22 for DME - pt wishes to proceed  - RBA of procedure discussed, questions answered - informed consent obtained and signed - see procedure notes - f/u 4 weeks DFE/OCT, possible injection(s)  3,4. Hypertensive retinopathy OU - discussed importance of tight BP control - monitor   5. Mixed Cataract OU - The symptoms of cataract, surgical options, and treatments and risks were discussed with patient. - discussed diagnosis and progression - not yet visually significant - monitor for now  Ophthalmic Meds Ordered this visit:  No orders of the defined types were placed in this encounter.     No follow-ups on file.  There are no Patient Instructions on file for this visit.   Explained the diagnoses, plan, and follow up with the patient and they expressed understanding.  Patient expressed understanding of the importance of proper follow up care.   This document serves as a record of services personally performed by Gardiner Sleeper, MD, PhD. It was created on their behalf by Leonie Douglas, an ophthalmic technician. The  creation of this record is the provider's dictation and/or activities during the visit.    Electronically signed by: Leonie Douglas COA, 11/14/21  2:44 PM   Gardiner Sleeper, M.D., Ph.D. Diseases & Surgery of the Retina and Vitreous [facility] 11/18/2021   Abbreviations: M myopia (nearsighted); A astigmatism; H hyperopia (farsighted); P presbyopia; Mrx spectacle prescription;  CTL contact lenses; OD right eye; OS left eye; OU both eyes  XT exotropia; ET esotropia; PEK punctate epithelial keratitis; PEE punctate epithelial erosions; DES dry eye syndrome; MGD meibomian gland dysfunction; ATs artificial tears;  PFAT's preservative free artificial tears; [place] nuclear sclerotic cataract; PSC posterior subcapsular cataract; ERM epi-retinal membrane; PVD posterior vitreous detachment; RD retinal detachment; DM diabetes mellitus; DR diabetic retinopathy; NPDR non-proliferative diabetic retinopathy; PDR proliferative diabetic retinopathy; CSME clinically significant macular edema; DME diabetic macular edema; dbh dot blot hemorrhages; CWS cotton wool spot; POAG primary open angle glaucoma; C/D cup-to-disc ratio; HVF humphrey visual field; GVF goldmann visual field; OCT optical coherence tomography; IOP intraocular pressure; BRVO Branch retinal vein occlusion; CRVO central retinal vein occlusion; CRAO central retinal artery occlusion; BRAO branch retinal artery occlusion; RT retinal tear; SB scleral buckle; PPV pars plana vitrectomy; VH Vitreous hemorrhage; PRP panretinal laser photocoagulation; IVK intravitreal kenalog; VMT vitreomacular traction; MH Macular hole;  NVD neovascularization of the disc; NVE neovascularization elsewhere; AREDS age related eye disease study; ARMD age related macular degeneration; POAG primary open angle glaucoma; EBMD epithelial/anterior basement membrane dystrophy; ACIOL anterior chamber intraocular lens; IOL intraocular lens; PCIOL posterior chamber intraocular  lens; Phaco/IOL phacoemulsification with intraocular lens placement; [place] photorefractive keratectomy; LASIK laser assisted in situ keratomileusis; HTN hypertension; DM diabetes mellitus; COPD chronic obstructive pulmonary disease

## 2021-11-18 ENCOUNTER — Encounter (INDEPENDENT_AMBULATORY_CARE_PROVIDER_SITE_OTHER): Payer: No Typology Code available for payment source | Admitting: Ophthalmology

## 2021-11-30 NOTE — Progress Notes (Addendum)
[facility] Note  12/02/2021     CHIEF COMPLAINT Patient presents for Retina Follow Up   HISTORY OF PRESENT ILLNESS: Edward Riley is a 56 y.o. male who presents to the clinic today for:   HPI     Retina Follow Up   Patient presents with  Diabetic Retinopathy.  In both eyes.  This started 6 weeks ago.  I, the attending physician,  performed the HPI with the patient and updated documentation appropriately.        Comments   Patient here for 6 weeks retina follow up for PDR OU. Patient states vision doing good. About the same. Some days better than other days. OD has a floater since last shot. First time having a floater. No eye pain.       Last edited by Bernarda Caffey, MD on 12/03/2021  2:09 AM.    Pt is delayed to follow up from 4 weeks to 6 weeks due to weather, he states vision is the same  Referring physician: Luetta Nutting, [address],  [place] [address]  HISTORICAL INFORMATION:   Selected notes from the [facility] Referred by Dr. Luetta Nutting for diabetic eye exam LEE: [facility] Ocular Hx- PMH-    CURRENT MEDICATIONS: No current outpatient medications on file. (Ophthalmic Drugs)   No current facility-administered medications for this visit. (Ophthalmic Drugs)   Current Outpatient Medications (Other)  Medication Sig   AMBULATORY NON FORMULARY MEDICATION Bayer test strips and lancets. Use to check blood sugar twice a day. Dx: Type Two Diabetes Mellitus   amLODipine (NORVASC) 10 MG tablet TAKE 1 TABLET(10 MG) BY MOUTH DAILY   BD PEN NEEDLE NANO U/F 32G X 4 MM MISC USE AS DIRECTED   Blood Glucose Monitoring Suppl (ONE TOUCH ULTRA 2) w/Device KIT Use to check blood sugar twice a day. Send strips and lancets, quantity 100 refills 3.  Dx: Type Two Diabetes Mellitus   glucose blood (ONE TOUCH ULTRA TEST) test strip Use as instructed   hydrochlorothiazide (HYDRODIURIL) 25 MG  tablet TAKE 1 TABLET(25 MG) BY MOUTH DAILY   Insulin Pen Needle (PEN NEEDLES) 31G X 8 MM MISC Use the pen needles for both the insulin and victoza pens daily   Lancet Device MISC Check blood sugar once or twice daily as needed.   lisinopril (ZESTRIL) 20 MG tablet Take 1 tablet (20 mg total) by mouth every morning.   pioglitazone (ACTOS) 45 MG tablet Take 1 tablet (45 mg total) by mouth daily.   predniSONE (DELTASONE) 20 MG tablet Take 2 tablets (40 mg total) by mouth daily with breakfast.   rosuvastatin (CRESTOR) 20 MG tablet Take 1 tablet (20 mg total) by mouth daily.   tadalafil (CIALIS) 20 MG tablet Take 1 tablet (20 mg total) by mouth every other day.   tirzepatide (MOUNJARO) 10 MG/0.5ML Pen Inject 10 mg into the skin once a week.   tirzepatide [facility]) 2.5 MG/0.5ML Pen Inject 2.5 mg into the skin once a week. Take for 28 days and increase to 46m   tirzepatide (MOUNJARO) 5 MG/0.5ML Pen Inject 5 mg into the skin once a week. Take for 28 days and increase to 7.568m  tirzepatide (MOUNJARO) 7.5 MG/0.5ML Pen Inject 7.5 mg into the skin once a week. Take for 28 days and increase to 1028m XIGDUO XR 08-999 MG TB24 TAKE ONE TABLET BY MOUTH DAILY   No current  facility-administered medications for this visit. (Other)   REVIEW OF SYSTEMS: ROS   Positive for: Endocrine, Cardiovascular, Eyes Negative for: Constitutional, Gastrointestinal, Neurological, Skin, Genitourinary, Musculoskeletal, HENT, Respiratory, Psychiatric, Allergic/Imm, Heme/Lymph Last edited by Theodore Demark, COA on 12/02/2021  2:04 PM.     ALLERGIES No Known Allergies  PAST MEDICAL HISTORY Past Medical History:  Diagnosis Date   Diabetes mellitus without complication ([place])    Hyperlipidemia    Hypertension    Obese    Retinal hemorrhage, both eyes    Sessile colonic polyp 02/04/2019   Colonoscopy February 2020.   History reviewed. No pertinent surgical history.  FAMILY HISTORY Family History  Problem Relation Age  of Onset   Arthritis Mother    Hyperlipidemia Mother    Hypertension Mother    Cancer Father 25       Prostate (Metastatic) Lung, Esophagus, Stomach   Alcohol abuse Father    Kidney disease Brother    Hypertension Brother    Cancer Paternal Aunt        Breast Cancer   Alzheimer's disease Maternal Grandmother    Heart disease Paternal Grandmother    Diabetes Brother    Alcohol abuse Maternal Uncle    SOCIAL HISTORY Social History   Tobacco Use   Smoking status: Never   Smokeless tobacco: Never  Vaping Use   Vaping Use: Never used  Substance Use Topics   Alcohol use: Yes    Comment: Rarely   Drug use: No       OPHTHALMIC EXAM: Base Eye Exam     Visual Acuity (Snellen - Linear)       Right Left   Dist cc 20/25 20/20 -2   Dist ph cc 20/20 -2     Correction: Glasses         Tonometry (Tonopen, 2:01 PM)       Right Left   Pressure 16 14         Pupils       Dark Light Shape React APD   Right 3 2 Round Minimal None   Left 3 2 Round Minimal None         Visual Fields (Counting fingers)       Left Right    Full Full         Extraocular Movement       Right Left    Full, Ortho Full, Ortho         Neuro/Psych     Oriented x3: Yes   Mood/Affect: Normal         Dilation     Both eyes: 1.0% Mydriacyl, 2.5% Phenylephrine @ 2:01 PM           Slit Lamp and Fundus Exam     Slit Lamp Exam       Right Left   Lids/Lashes Dermatochalasis - upper lid Dermatochalasis - upper lid   Conjunctiva/Sclera nasal pingeucula nasal and temporal pinguecula   Cornea trace PEE trace PEE   Anterior Chamber Deep and quiet Deep and quiet   Iris Round and dilated, No NVI Round and dilated, No NVI   Lens 2+ Nuclear sclerosis, 2+ Cortical cataract 2+ Nuclear sclerosis, 2+ Cortical cataract   Anterior Vitreous Mild Vitreous syneresis Mild Vitreous syneresis         Fundus Exam       Right Left   Disc Pink and Sharp, PPA, Compact mild tilt, Pink  and Sharp, PPA   C/D Ratio 0.2  0.2   Macula good foveal reflex, trace MA, focal exudates superior macula -- improved/resolved, +perifoveal cystic changes --improved Good foveal reflex, Microaneurysms -- improved, trace Cystic changes temporal to fovea -- persistent   Vessels mild attenuation, mild Copper wiring, mild AV crossing changes, ?NV temporal periphery mild attenuation, mild Copper wiring, mild AV crossing changes, trace focal fibrosis along distal IT arcade, ?NV temporal periphery   Periphery Attached, 360 PRP with good laser fill in, pre-retinal heme temporal periphery improved -- ?macroaneurysms Attached, 360 PRP with good fill in, scattered IRH/DBH - improved           Refraction     Wearing Rx       Sphere Cylinder Axis   Right -2.50 +1.00 138   Left -2.25 +1.00 173           IMAGING AND PROCEDURES  Imaging and Procedures for 12/02/2021  OCT, Retina - OU - Both Eyes       Right Eye Quality was good. Central Foveal Thickness: 410. Progression has improved. Findings include intraretinal fluid, no SRF, vitreomacular adhesion , normal foveal contour (Interval improvement in IRF ).   Left Eye Quality was good. Central Foveal Thickness: 343. Progression has improved. Findings include normal foveal contour, no SRF, intraretinal fluid, vitreomacular adhesion (Persistent cystic changes greatest temporal macula).   Notes *Images captured and stored on drive  Diagnosis / Impression:  +DME OU OD: Interval improvement in IRF OS: Mild interval improvement in IRF/cystic changes temporal macula  Clinical management:  See below  Abbreviations: NFP - Normal foveal profile. CME - cystoid macular edema. PED - pigment epithelial detachment. IRF - intraretinal fluid. SRF - subretinal fluid. EZ - ellipsoid zone. ERM - epiretinal membrane. ORA - outer retinal atrophy. ORT - outer retinal tubulation. SRHM - subretinal hyper-reflective material. IRHM - intraretinal hyper-reflective  material      Intravitreal Injection, Pharmacologic Agent - OD - Right Eye       Time Out 12/02/2021. 2:20 PM. Confirmed correct patient, procedure, site, and patient consented.   Anesthesia Topical anesthesia was used. Anesthetic medications included Lidocaine 2%, Proparacaine 0.5%.   Procedure Preparation included 5% betadine to ocular surface, eyelid speculum. A supplied needle was used.   Injection: 1.25 mg Bevacizumab 1.54m/0.05ml   Route: Intravitreal, Site: Right Eye   NDC:: 50539-767-34 Lot: [id number] , Expiration date: 01/03/2022, Waste: 0 mL   Post-op Post injection exam found visual acuity of at least counting fingers. The patient tolerated the procedure well. There were no complications. The patient received written and verbal post procedure care education. Post injection medications were not given.      Intravitreal Injection, Pharmacologic Agent - OS - Left Eye       Time Out 12/02/2021. 2:20 PM. Confirmed correct patient, procedure, site, and patient consented.   Anesthesia Topical anesthesia was used. Anesthetic medications included Lidocaine 2%, Proparacaine 0.5%.   Procedure Preparation included 5% betadine to ocular surface, eyelid speculum. A (32 g) needle was used.   Injection: 1.25 mg Bevacizumab 1.262m0.05ml   Route: Intravitreal, Site: Left Eye   NDC: [id number]: : [id number] date: 12/24/2021, Waste: 0 mL   Post-op Post injection exam found visual acuity of at least counting fingers. The patient tolerated the procedure well. There were no complications. The patient received written and verbal post procedure care education. Post injection medications were not given.            ASSESSMENT/PLAN:   ICD-10-CM   1. Proliferative diabetic retinopathy of  both eyes with macular edema associated with type 2 diabetes mellitus (HCC)  E11.3513 OCT, Retina - OU - Both Eyes    Intravitreal Injection, Pharmacologic Agent - OD - Right Eye     Intravitreal Injection, Pharmacologic Agent - OS - Left Eye    Bevacizumab (AVASTIN) SOLN 1.25 mg    Bevacizumab (AVASTIN) SOLN 1.25 mg    2. Essential hypertension  I10     3. Hypertensive retinopathy of both eyes  H35.033     4. Combined forms of age-related cataract of both eyes  H25.813      1. 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, OU (OD > OS)  - pt is delayed to follow up from 4 weeks to 6 weeks due to weather  - pt with history of PRP laser OU in [place] ~10+ yrs ago -- pt can't remember provider - exam shows scattered MA/IRH and 360 PRP, room for fill in OU - FA (9.19.22) shows late-leaking MA, vascular nonperfusion, +NV OU -- will need PRP fill in OU - s/p PRP OD (09.29.22) - s/p PRP OS (11.14.22) - s/p IVA OD #1 (09.19.22) - s/p IVA OS #1 (09.23.22) - s/p IVA OU #2 (10.21.22), #3 (12.23.22) - BCVA 20/20 OU - OCT shows OD: Interval improvement in IRF; OS: Mild interval improvement in IRF/cystic changes temporal macula - recommend IVA OU #4 today, 01.06.23 for DME - pt wishes to proceed  - RBA of procedure discussed, questions answered - informed consent obtained and signed - see procedure notes - f/u 4 weeks DFE/OCT, possible injection(s)  2,3. Hypertensive retinopathy OU - discussed importance of tight BP control - monitor   4. Mixed Cataract OU - The symptoms of cataract, surgical options, and treatments and risks were discussed with patient. - discussed diagnosis and progression - not yet visually significant - monitor for now  Ophthalmic Meds Ordered this visit:  Meds ordered this encounter  Medications   Bevacizumab (AVASTIN) SOLN 1.25 mg   Bevacizumab (AVASTIN) SOLN 1.25 mg     Return in about 4 weeks (around 12/30/2021) for PDR w/ DME OU , Dilated Exam, OCT, Possible Injxn.  There are no Patient Instructions on file for this visit.   Explained the diagnoses, plan, and follow up with the patient and they expressed understanding.  Patient  expressed understanding of the importance of proper follow up care.   This document serves as a record of services personally performed by Gardiner Sleeper, MD, PhD. It was created on their behalf by Leonie Douglas, an ophthalmic technician. The creation of this record is the provider's dictation and/or activities during the visit.    Electronically signed by: Leonie Douglas COA, 12/04/21  12:44 PM  Gardiner Sleeper, M.D., Ph.D. Diseases & Surgery of the Retina and [place] 12/02/2021  I have reviewed the above documentation for accuracy and completeness, and I agree with the above. Gardiner Sleeper, M.D., Ph.D. 12/04/21 12:44 PM   Abbreviations: M myopia (nearsighted); A astigmatism; H hyperopia (farsighted); P presbyopia; Mrx spectacle prescription;  CTL contact lenses; OD right eye; OS left eye; OU both eyes  XT exotropia; ET esotropia; PEK punctate epithelial keratitis; PEE punctate epithelial erosions; DES dry eye syndrome; MGD meibomian gland dysfunction; ATs artificial tears; PFAT's preservative free artificial tears; [place] nuclear sclerotic cataract; PSC posterior subcapsular cataract; ERM epi-retinal membrane; PVD posterior vitreous detachment; RD retinal detachment; DM diabetes mellitus; DR diabetic retinopathy; NPDR non-proliferative diabetic retinopathy; PDR proliferative diabetic retinopathy; CSME  clinically significant macular edema; DME diabetic macular edema; dbh dot blot hemorrhages; CWS cotton wool spot; POAG primary open angle glaucoma; C/D cup-to-disc ratio; HVF humphrey visual field; GVF goldmann visual field; OCT optical coherence tomography; IOP intraocular pressure; BRVO Branch retinal vein occlusion; CRVO central retinal vein occlusion; CRAO central retinal artery occlusion; BRAO branch retinal artery occlusion; RT retinal tear; SB scleral buckle; PPV pars plana vitrectomy; VH Vitreous hemorrhage; PRP panretinal laser photocoagulation; IVK intravitreal  kenalog; VMT vitreomacular traction; MH Macular hole;  NVD neovascularization of the disc; NVE neovascularization elsewhere; AREDS age related eye disease study; ARMD age related macular degeneration; POAG primary open angle glaucoma; EBMD epithelial/anterior basement membrane dystrophy; ACIOL anterior chamber intraocular lens; IOL intraocular lens; PCIOL posterior chamber intraocular lens; Phaco/IOL phacoemulsification with intraocular lens placement; [place] photorefractive keratectomy; LASIK laser assisted in situ keratomileusis; HTN hypertension; DM diabetes mellitus; COPD chronic obstructive pulmonary disease

## 2021-12-02 ENCOUNTER — Other Ambulatory Visit: Payer: Self-pay

## 2021-12-02 ENCOUNTER — Ambulatory Visit (INDEPENDENT_AMBULATORY_CARE_PROVIDER_SITE_OTHER): Payer: No Typology Code available for payment source | Admitting: Ophthalmology

## 2021-12-02 ENCOUNTER — Encounter (INDEPENDENT_AMBULATORY_CARE_PROVIDER_SITE_OTHER): Payer: Self-pay | Admitting: Ophthalmology

## 2021-12-02 DIAGNOSIS — H35033 Hypertensive retinopathy, bilateral: Secondary | ICD-10-CM

## 2021-12-02 DIAGNOSIS — H25813 Combined forms of age-related cataract, bilateral: Secondary | ICD-10-CM

## 2021-12-02 DIAGNOSIS — I10 Essential (primary) hypertension: Secondary | ICD-10-CM | POA: Diagnosis not present

## 2021-12-02 DIAGNOSIS — E113513 Type 2 diabetes mellitus with proliferative diabetic retinopathy with macular edema, bilateral: Secondary | ICD-10-CM

## 2021-12-02 DIAGNOSIS — H3581 Retinal edema: Secondary | ICD-10-CM

## 2021-12-04 ENCOUNTER — Encounter (INDEPENDENT_AMBULATORY_CARE_PROVIDER_SITE_OTHER): Payer: Self-pay | Admitting: Ophthalmology

## 2021-12-04 DIAGNOSIS — E113513 Type 2 diabetes mellitus with proliferative diabetic retinopathy with macular edema, bilateral: Secondary | ICD-10-CM

## 2021-12-04 MED ORDER — BEVACIZUMAB CHEMO INJECTION 1.25MG/0.05ML SYRINGE FOR KALEIDOSCOPE
1.2500 mg | INTRAVITREAL | Status: AC | PRN
  Administered 2021-12-04: 1.25 mg via INTRAVITREAL

## 2021-12-28 NOTE — Progress Notes (Signed)
[facility] Note  12/30/2021     CHIEF COMPLAINT Patient presents for Retina Follow Up   HISTORY OF PRESENT ILLNESS: Edward Riley is a 56 y.o. male who presents to the clinic today for:   HPI     Retina Follow Up   Patient presents with  Diabetic Retinopathy.  In both eyes.  This started 4 weeks ago.  I, the attending physician,  performed the HPI with the patient and updated documentation appropriately.        Comments   Patient here for 4 weeks retina follow up for PDR OU. Patient states vision doing the same. No difference. No eye pain.       Last edited by Bernarda Caffey, MD on 01/02/2022  8:52 AM.      Referring physician: Luetta Nutting, DO [address]  [address] [place],  [place] [address]  HISTORICAL INFORMATION:   Selected notes from the MEDICAL RECORD NUMBER Referred by Dr. Luetta Nutting for diabetic eye exam LEE: [facility] Ocular Hx- PMH-    CURRENT MEDICATIONS: No current outpatient medications on file. (Ophthalmic Drugs)   No current facility-administered medications for this visit. (Ophthalmic Drugs)   Current Outpatient Medications (Other)  Medication Sig   AMBULATORY NON FORMULARY MEDICATION Bayer test strips and lancets. Use to check blood sugar twice a day. Dx: Type Two Diabetes Mellitus   amLODipine (NORVASC) 10 MG tablet TAKE 1 TABLET(10 MG) BY MOUTH DAILY   BD PEN NEEDLE NANO U/F 32G X 4 MM MISC USE AS DIRECTED   Blood Glucose Monitoring Suppl (ONE TOUCH ULTRA 2) w/Device KIT Use to check blood sugar twice a day. Send strips and lancets, quantity 100 refills 3.  Dx: Type Two Diabetes Mellitus   glucose blood (ONE TOUCH ULTRA TEST) test strip Use as instructed   hydrochlorothiazide (HYDRODIURIL) 25 MG tablet TAKE 1 TABLET(25 MG) BY MOUTH DAILY   Insulin Pen Needle (PEN NEEDLES) 31G X 8 MM MISC Use the pen needles for both the insulin and victoza pens daily   Lancet Device MISC Check  blood sugar once or twice daily as needed.   lisinopril (ZESTRIL) 20 MG tablet Take 1 tablet (20 mg total) by mouth every morning.   pioglitazone (ACTOS) 45 MG tablet Take 1 tablet (45 mg total) by mouth daily.   predniSONE (DELTASONE) 20 MG tablet Take 2 tablets (40 mg total) by mouth daily with breakfast.   rosuvastatin (CRESTOR) 20 MG tablet Take 1 tablet (20 mg total) by mouth daily.   tadalafil (CIALIS) 20 MG tablet Take 1 tablet (20 mg total) by mouth every other day.   tirzepatide (MOUNJARO) 10 MG/0.5ML Pen Inject 10 mg into the skin once a week.   tirzepatide [facility]) 2.5 MG/0.5ML Pen Inject 2.5 mg into the skin once a week. Take for 28 days and increase to 73m   tirzepatide (MOUNJARO) 5 MG/0.5ML Pen Inject 5 mg into the skin once a week. Take for 28 days and increase to 7.562m  tirzepatide (MOUNJARO) 7.5 MG/0.5ML Pen Inject 7.5 mg into the skin once a week. Take for 28 days and increase to 1045m XIGDUO XR 08-999 MG TB24 TAKE ONE TABLET BY MOUTH DAILY   No current facility-administered medications for this visit. (Other)   REVIEW OF SYSTEMS: ROS   Positive for: Endocrine, Cardiovascular, Eyes Negative for: Constitutional, Gastrointestinal, Neurological, Skin, Genitourinary, Musculoskeletal, HENT, Respiratory, Psychiatric, Allergic/Imm, Heme/Lymph Last edited by ClaTheodore Demark  COA on 12/30/2021  1:39 PM.     ALLERGIES No Known Allergies  PAST MEDICAL HISTORY Past Medical History:  Diagnosis Date   Diabetes mellitus without complication ([place])    Hyperlipidemia    Hypertension    Obese    Retinal hemorrhage, both eyes    Sessile colonic polyp 02/04/2019   Colonoscopy February 2020.   History reviewed. No pertinent surgical history.  FAMILY HISTORY Family History  Problem Relation Age of Onset   Arthritis Mother    Hyperlipidemia Mother    Hypertension Mother    Cancer Father 32       Prostate (Metastatic) Lung, Esophagus, Stomach   Alcohol abuse Father     Kidney disease Brother    Hypertension Brother    Cancer Paternal Aunt        Breast Cancer   Alzheimer's disease Maternal Grandmother    Heart disease Paternal Grandmother    Diabetes Brother    Alcohol abuse Maternal Uncle    SOCIAL HISTORY Social History   Tobacco Use   Smoking status: Never   Smokeless tobacco: Never  Vaping Use   Vaping Use: Never used  Substance Use Topics   Alcohol use: Yes    Comment: Rarely   Drug use: No       OPHTHALMIC EXAM: Base Eye Exam     Visual Acuity (Snellen - Linear)       Right Left   Dist cc 20/25 -1 20/20 -1   Dist ph cc 20/20 -2     Correction: Glasses         Tonometry (Tonopen, 1:36 PM)       Right Left   Pressure 12 12         Pupils       Dark Light Shape React APD   Right 3 2 Round Minimal None   Left 3 2 Round Minimal None         Visual Fields (Counting fingers)       Left Right    Full Full         Extraocular Movement       Right Left    Full, Ortho Full, Ortho         Neuro/Psych     Oriented x3: Yes   Mood/Affect: Normal         Dilation     Both eyes: 1.0% Mydriacyl, 2.5% Phenylephrine @ 1:36 PM           Slit Lamp and Fundus Exam     Slit Lamp Exam       Right Left   Lids/Lashes Dermatochalasis - upper lid Dermatochalasis - upper lid   Conjunctiva/Sclera nasal pingeucula nasal and temporal pinguecula   Cornea trace PEE trace PEE   Anterior Chamber Deep and quiet Deep and quiet   Iris Round and dilated, No NVI Round and dilated, No NVI   Lens 2+ Nuclear sclerosis, 2+ Cortical cataract 2+ Nuclear sclerosis, 2+ Cortical cataract   Anterior Vitreous Mild Vitreous syneresis Mild Vitreous syneresis         Fundus Exam       Right Left   Disc Pink and Sharp, PPA, Compact mild tilt, Pink and Sharp, PPA   C/D Ratio 0.2 0.2   Macula good foveal reflex, trace MA, no exudates, +perifoveal cystic changes --improved Good foveal reflex, Microaneurysms -- improved, trace  Cystic changes temporal to fovea -- improved   Vessels mild attenuation, mild Copper wiring,  mild AV crossing changes, ?NV temporal periphery mild attenuation, mild Copper wiring, mild AV crossing changes, trace focal fibrosis along distal IT arcade, ?NV temporal periphery   Periphery Attached, 360 PRP with good laser fill in, pre-retinal heme temporal periphery improved -- ?macroaneurysms Attached, 360 PRP with good fill in, scattered IRH/DBH - improved           Refraction     Wearing Rx       Sphere Cylinder Axis   Right -2.50 +1.00 138   Left -2.25 +1.00 173           IMAGING AND PROCEDURES  Imaging and Procedures for 12/30/2021  OCT, Retina - OU - Both Eyes       Right Eye Quality was good. Central Foveal Thickness: 362. Progression has improved. Findings include intraretinal fluid, no SRF, vitreomacular adhesion , normal foveal contour (Mild Interval improvement in non-central IRF/cystic changes ).   Left Eye Quality was good. Central Foveal Thickness: 343. Progression has improved. Findings include normal foveal contour, no SRF, intraretinal fluid, vitreomacular adhesion (Interval improvement in IRF temporal macula).   Notes *Images captured and stored on drive  Diagnosis / Impression:  +DME OU OD: Mild Interval improvement in non-central IRF/cystic changes  OS: Interval improvement in IRF temporal macula  Clinical management:  See below  Abbreviations: NFP - Normal foveal profile. CME - cystoid macular edema. PED - pigment epithelial detachment. IRF - intraretinal fluid. SRF - subretinal fluid. EZ - ellipsoid zone. ERM - epiretinal membrane. ORA - outer retinal atrophy. ORT - outer retinal tubulation. SRHM - subretinal hyper-reflective material. IRHM - intraretinal hyper-reflective material      Intravitreal Injection, Pharmacologic Agent - OD - Right Eye       Time Out 12/30/2021. 2:50 PM. Confirmed correct patient, procedure, site, and patient consented.    Anesthesia Topical anesthesia was used. Anesthetic medications included Lidocaine 2%, Proparacaine 0.5%.   Procedure Preparation included 5% betadine to ocular surface, eyelid speculum. A supplied needle was used.   Injection: 1.25 mg Bevacizumab 1.13m/0.05ml   Route: Intravitreal, Site: Right Eye   NDC:: 83094-076-80 Lot: [id number] , Expiration date: 02/08/2022   Post-op Post injection exam found visual acuity of at least counting fingers. The patient tolerated the procedure well. There were no complications. The patient received written and verbal post procedure care education. Post injection medications were not given.      Intravitreal Injection, Pharmacologic Agent - OS - Left Eye       Time Out 12/30/2021. 2:50 PM. Confirmed correct patient, procedure, site, and patient consented.   Anesthesia Topical anesthesia was used. Anesthetic medications included Lidocaine 2%, Proparacaine 0.5%.   Procedure Preparation included 5% betadine to ocular surface, eyelid speculum. A (32 g) needle was used.   Injection: 1.25 mg Bevacizumab 1.259m0.05ml   Route: Intravitreal, Site: Left Eye   NDC: 30mLot: [id number], Expiration date: 02/06/2022   Post-op Post injection exam found visual acuity of at least counting fingers. The patient tolerated the procedure well. There were no complications. The patient received written and verbal post procedure care education. Post injection medications were not given.            ASSESSMENT/PLAN:   ICD-10-CM   1. Proliferative diabetic retinopathy of both eyes with macular edema associated with type 2 diabetes mellitus (HCC)  E11.3513 OCT, Retina - OU - Both Eyes    Intravitreal Injection, Pharmacologic Agent - OD - Right Eye    Intravitreal Injection, Pharmacologic Agent - OS -  Left Eye    Bevacizumab (AVASTIN) SOLN 1.25 mg    Bevacizumab (AVASTIN) SOLN 1.25 mg    2. Essential hypertension  I10     3. Hypertensive retinopathy of  both eyes  H35.033     4. Combined forms of age-related cataract of both eyes  H25.813      1. 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, OU (OD > OS)  - pt with history of PRP laser OU in [place] ~10+ yrs ago -- pt can't remember provider - exam shows scattered MA/IRH and 360 PRP, room for fill in OU - FA (9.19.22) shows late-leaking MA, vascular nonperfusion, +NV OU -- will need PRP fill in OU - s/p PRP OD (09.29.22) - s/p PRP OS (11.14.22) - s/p IVA OD #1 (09.19.22) - s/p IVA OS #1 (09.23.22) - s/p IVA OU #2 (10.21.22), #3 (12.23.22), #4 (01.06.23) - BCVA 20/20 OU - OCT shows OD: Mild Interval improvement in non-central IRF/cystic changes; OS: Interval improvement in IRF temporal macula - recommend IVA OU #5 today, 02.03.23 for DME - pt wishes to proceed  - RBA of procedure discussed, questions answered - informed consent obtained and signed - see procedure notes - f/u 4 weeks DFE/OCT, possible injection(s)  2,3. Hypertensive retinopathy OU - discussed importance of tight BP control - monitor    4. Mixed Cataract OU - The symptoms of cataract, surgical options, and treatments and risks were discussed with patient. - discussed diagnosis and progression - not yet visually significant - monitor for now  Ophthalmic Meds Ordered this visit:  Meds ordered this encounter  Medications   Bevacizumab (AVASTIN) SOLN 1.25 mg   Bevacizumab (AVASTIN) SOLN 1.25 mg     Return in about 4 weeks (around 01/27/2022) for f/u PDR OU, DFE, OCT.  There are no Patient Instructions on file for this visit.   Explained the diagnoses, plan, and follow up with the patient and they expressed understanding.  Patient expressed understanding of the importance of proper follow up care.   This document serves as a record of services personally performed by Gardiner Sleeper, MD, PhD. It was created on their behalf by Leonie Douglas, an ophthalmic technician. The creation of this record is the  provider's dictation and/or activities during the visit.    Electronically signed by: Leonie Douglas COA, 01/02/22  8:54 AM  This document serves as a record of services personally performed by Gardiner Sleeper, MD, PhD. It was created on their behalf by San Jetty. Owens Shark, OA an ophthalmic technician. The creation of this record is the provider's dictation and/or activities during the visit.    Electronically signed by: San Jetty. Owens Shark, [place] 02.03.2023 8:54 AM   Gardiner Sleeper, M.D., Ph.D. Diseases & Surgery of the Retina and [place] 12/30/2021  I have reviewed the above documentation for accuracy and completeness, and I agree with the above. Gardiner Sleeper, M.D., Ph.D. 01/02/22 8:54 AM  Abbreviations: M myopia (nearsighted); A astigmatism; H hyperopia (farsighted); P presbyopia; Mrx spectacle prescription;  CTL contact lenses; OD right eye; OS left eye; OU both eyes  XT exotropia; ET esotropia; PEK punctate epithelial keratitis; PEE punctate epithelial erosions; DES dry eye syndrome; MGD meibomian gland dysfunction; ATs artificial tears; PFAT's preservative free artificial tears; [place] nuclear sclerotic cataract; PSC posterior subcapsular cataract; ERM epi-retinal membrane; PVD posterior vitreous detachment; RD retinal detachment; DM diabetes mellitus; DR diabetic retinopathy; NPDR non-proliferative diabetic retinopathy; PDR proliferative diabetic retinopathy; CSME clinically significant macular  edema; DME diabetic macular edema; dbh dot blot hemorrhages; CWS cotton wool spot; POAG primary open angle glaucoma; C/D cup-to-disc ratio; HVF humphrey visual field; GVF goldmann visual field; OCT optical coherence tomography; IOP intraocular pressure; BRVO Branch retinal vein occlusion; CRVO central retinal vein occlusion; CRAO central retinal artery occlusion; BRAO branch retinal artery occlusion; RT retinal tear; SB scleral buckle; PPV pars plana vitrectomy; VH Vitreous  hemorrhage; PRP panretinal laser photocoagulation; IVK intravitreal kenalog; VMT vitreomacular traction; MH Macular hole;  NVD neovascularization of the disc; NVE neovascularization elsewhere; AREDS age related eye disease study; ARMD age related macular degeneration; POAG primary open angle glaucoma; EBMD epithelial/anterior basement membrane dystrophy; ACIOL anterior chamber intraocular lens; IOL intraocular lens; PCIOL posterior chamber intraocular lens; Phaco/IOL phacoemulsification with intraocular lens placement; [place] photorefractive keratectomy; LASIK laser assisted in situ keratomileusis; HTN hypertension; DM diabetes mellitus; COPD chronic obstructive pulmonary disease

## 2021-12-30 ENCOUNTER — Encounter (INDEPENDENT_AMBULATORY_CARE_PROVIDER_SITE_OTHER): Payer: Self-pay | Admitting: Ophthalmology

## 2021-12-30 ENCOUNTER — Other Ambulatory Visit: Payer: Self-pay

## 2021-12-30 ENCOUNTER — Ambulatory Visit (INDEPENDENT_AMBULATORY_CARE_PROVIDER_SITE_OTHER): Payer: No Typology Code available for payment source | Admitting: Ophthalmology

## 2021-12-30 DIAGNOSIS — I10 Essential (primary) hypertension: Secondary | ICD-10-CM

## 2021-12-30 DIAGNOSIS — H25813 Combined forms of age-related cataract, bilateral: Secondary | ICD-10-CM

## 2021-12-30 DIAGNOSIS — E113513 Type 2 diabetes mellitus with proliferative diabetic retinopathy with macular edema, bilateral: Secondary | ICD-10-CM

## 2021-12-30 DIAGNOSIS — H35033 Hypertensive retinopathy, bilateral: Secondary | ICD-10-CM

## 2022-01-02 ENCOUNTER — Encounter (INDEPENDENT_AMBULATORY_CARE_PROVIDER_SITE_OTHER): Payer: Self-pay | Admitting: Ophthalmology

## 2022-01-02 ENCOUNTER — Other Ambulatory Visit: Payer: Self-pay | Admitting: Family Medicine

## 2022-01-02 MED ORDER — BEVACIZUMAB CHEMO INJECTION 1.25MG/0.05ML SYRINGE FOR KALEIDOSCOPE
1.2500 mg | INTRAVITREAL | Status: AC | PRN
  Administered 2022-01-02: 1.25 mg via INTRAVITREAL

## 2022-01-10 ENCOUNTER — Other Ambulatory Visit: Payer: Self-pay

## 2022-01-10 ENCOUNTER — Telehealth: Payer: Self-pay

## 2022-01-10 MED ORDER — TIRZEPATIDE 5 MG/0.5ML ~~LOC~~ SOAJ: 5.0000 mg | SUBCUTANEOUS | 0 refills | Status: DC

## 2022-01-10 NOTE — Telephone Encounter (Signed)
Initiated Prior authorization JZP:[id number] (MOUNJARO) 2.5 MG/0.5ML Pen MOUNJARO) 5 MG (MOUNJARO) 7.5 MOUNJARO) 10      Via: Covermymeds Case/Key:[id number] - PA Case ID: [id number] Status: approved as of 01/10/22 Reason:New script was sent to pharmacy Notified Pt via: Mychart

## 2022-01-25 NOTE — Progress Notes (Addendum)
[facility] Note  01/27/2022     CHIEF COMPLAINT Patient presents for Retina Follow Up   HISTORY OF PRESENT ILLNESS: Edward Riley is a 56 y.o. male who presents to the clinic today for:   HPI     Retina Follow Up   Patient presents with  Diabetic Retinopathy.  In both eyes.  This started 4 weeks ago.  I, the attending physician,  performed the HPI with the patient and updated documentation appropriately.        Comments   Patient here for 4 weeks retina follow up for PDR OU. Patient states vision the same. No eye pain.       Last edited by Bernarda Caffey, MD on 01/27/2022  3:16 PM.     Referring physician: Luetta Nutting, DO [address]  [address] [place],  [place] [address]  HISTORICAL INFORMATION:   Selected notes from the MEDICAL RECORD NUMBER Referred by Dr. Luetta Nutting for diabetic eye exam LEE: [facility] Ocular Hx- PMH-    CURRENT MEDICATIONS: No current outpatient medications on file. (Ophthalmic Drugs)   No current facility-administered medications for this visit. (Ophthalmic Drugs)   Current Outpatient Medications (Other)  Medication Sig   AMBULATORY NON FORMULARY MEDICATION Bayer test strips and lancets. Use to check blood sugar twice a day. Dx: Type Two Diabetes Mellitus   amLODipine (NORVASC) 10 MG tablet TAKE 1 TABLET(10 MG) BY MOUTH DAILY   BD PEN NEEDLE NANO U/F 32G X 4 MM MISC USE AS DIRECTED   Blood Glucose Monitoring Suppl (ONE TOUCH ULTRA 2) w/Device KIT Use to check blood sugar twice a day. Send strips and lancets, quantity 100 refills 3.  Dx: Type Two Diabetes Mellitus   glucose blood (ONE TOUCH ULTRA TEST) test strip Use as instructed   hydrochlorothiazide (HYDRODIURIL) 25 MG tablet TAKE 1 TABLET(25 MG) BY MOUTH DAILY   Insulin Pen Needle (PEN NEEDLES) 31G X 8 MM MISC Use the pen needles for both the insulin and victoza pens daily   Lancet Device MISC Check blood sugar once or twice  daily as needed.   lisinopril (ZESTRIL) 20 MG tablet Take 1 tablet (20 mg total) by mouth every morning.   pioglitazone (ACTOS) 45 MG tablet Take 1 tablet (45 mg total) by mouth daily.   predniSONE (DELTASONE) 20 MG tablet Take 2 tablets (40 mg total) by mouth daily with breakfast.   rosuvastatin (CRESTOR) 20 MG tablet Take 1 tablet (20 mg total) by mouth daily.   tadalafil (CIALIS) 20 MG tablet Take 1 tablet (20 mg total) by mouth every other day.   tirzepatide (MOUNJARO) 10 MG/0.5ML Pen Inject 10 mg into the skin once a week.   tirzepatide [facility]) 5 MG/0.5ML Pen Inject 5 mg into the skin once a week. Take for 28 days and increase to 7.68m   tirzepatide (MOUNJARO) 7.5 MG/0.5ML Pen Inject 7.5 mg into the skin once a week. Take for 28 days and increase to 165m  XIGDUO XR 08-999 MG TB24 TAKE ONE TABLET BY MOUTH DAILY   No current facility-administered medications for this visit. (Other)   REVIEW OF SYSTEMS: ROS   Positive for: Endocrine, Cardiovascular, Eyes Negative for: Constitutional, Gastrointestinal, Neurological, Skin, Genitourinary, Musculoskeletal, HENT, Respiratory, Psychiatric, Allergic/Imm, Heme/Lymph Last edited by ClTheodore DemarkCOA on 01/27/2022  1:33 PM.      ALLERGIES No Known Allergies  PAST MEDICAL HISTORY Past Medical History:  Diagnosis Date   Diabetes  mellitus without complication ([place])    Hyperlipidemia    Hypertension    Obese    Retinal hemorrhage, both eyes    Sessile colonic polyp 02/04/2019   Colonoscopy February 2020.   History reviewed. No pertinent surgical history.  FAMILY HISTORY Family History  Problem Relation Age of Onset   Arthritis Mother    Hyperlipidemia Mother    Hypertension Mother    Cancer Father 38       Prostate (Metastatic) Lung, Esophagus, Stomach   Alcohol abuse Father    Kidney disease Brother    Hypertension Brother    Cancer Paternal Aunt        Breast Cancer   Alzheimer's disease Maternal Grandmother    Heart  disease Paternal Grandmother    Diabetes Brother    Alcohol abuse Maternal Uncle    SOCIAL HISTORY Social History   Tobacco Use   Smoking status: Never   Smokeless tobacco: Never  Vaping Use   Vaping Use: Never used  Substance Use Topics   Alcohol use: Yes    Comment: Rarely   Drug use: No       OPHTHALMIC EXAM: Base Eye Exam     Visual Acuity (Snellen - Linear)       Right Left   Dist cc 20/25 -2 20/20 -1   Dist ph cc 20/20 -2     Correction: Glasses         Tonometry (Tonopen, 1:31 PM)       Right Left   Pressure 14 14         Pupils       Dark Light Shape React APD   Right 3 2 Round Minimal None   Left 3 2 Round Minimal None         Visual Fields (Counting fingers)       Left Right    Full Full         Extraocular Movement       Right Left    Full, Ortho Full, Ortho         Neuro/Psych     Oriented x3: Yes   Mood/Affect: Normal         Dilation     Both eyes: 1.0% Mydriacyl, 2.5% Phenylephrine @ 1:31 PM           Slit Lamp and Fundus Exam     Slit Lamp Exam       Right Left   Lids/Lashes Dermatochalasis - upper lid Dermatochalasis - upper lid   Conjunctiva/Sclera nasal pingeucula nasal and temporal pinguecula   Cornea trace PEE trace PEE   Anterior Chamber Deep and quiet Deep and quiet   Iris Round and dilated, No NVI Round and dilated, No NVI   Lens 2+ Nuclear sclerosis, 2+ Cortical cataract 2+ Nuclear sclerosis, 2+ Cortical cataract   Anterior Vitreous Mild Vitreous syneresis Mild Vitreous syneresis         Fundus Exam       Right Left   Disc Pink and Sharp, PPA, Compact mild tilt, Pink and Sharp, PPA   C/D Ratio 0.2 0.2   Macula good foveal reflex, trace MA, no exudates, +perifoveal cystic changes -- persistent, no great targets for focal laser Good foveal reflex, Microaneurysms -- improved, trace Cystic changes temporal macula   Vessels mild attenuation, mild Copper wiring, mild AV crossing changes, ?NV  temporal periphery mild attenuation, mild Copper wiring, mild AV crossing changes, trace focal fibrosis along distal IT arcade, ?  NV temporal periphery   Periphery Attached, 360 PRP with good laser fill in, pre-retinal heme temporal periphery improved -- ?macroaneurysms - improving Attached, 360 PRP with good fill in, scattered IRH/DBH - improved           Refraction     Wearing Rx       Sphere Cylinder Axis   Right -2.50 +1.00 138   Left -2.25 +1.00 173           IMAGING AND PROCEDURES  Imaging and Procedures for 01/27/2022  OCT, Retina - OU - Both Eyes       Right Eye Quality was good. Central Foveal Thickness: 383. Progression has been stable. Findings include intraretinal fluid, no SRF, vitreomacular adhesion , normal foveal contour (Persistent IRF - ?mild improvement).   Left Eye Quality was good. Central Foveal Thickness: 349. Progression has improved. Findings include normal foveal contour, no SRF, intraretinal fluid, vitreomacular adhesion (Trace persistent IRF/cystic changes temporal macula).   Notes *Images captured and stored on drive  Diagnosis / Impression:  +DME OU OD: Persistent IRF - ?mild improvement  OS: Trace persistent IRF/cystic changes temporal macula  Clinical management:  See below  Abbreviations: NFP - Normal foveal profile. CME - cystoid macular edema. PED - pigment epithelial detachment. IRF - intraretinal fluid. SRF - subretinal fluid. EZ - ellipsoid zone. ERM - epiretinal membrane. ORA - outer retinal atrophy. ORT - outer retinal tubulation. SRHM - subretinal hyper-reflective material. IRHM - intraretinal hyper-reflective material      Intravitreal Injection, Pharmacologic Agent - OD - Right Eye       Time Out 01/27/2022. 1:57 PM. Confirmed correct patient, procedure, site, and patient consented.   Anesthesia Topical anesthesia was used. Anesthetic medications included Lidocaine 2%, Proparacaine 0.5%.   Procedure Preparation included  5% betadine to ocular surface, eyelid speculum. A supplied needle was used.   Injection: 1.25 mg Bevacizumab 1.43m/0.05ml   Route: Intravitreal, Site: Right Eye   NDC: 50242-060-01, Lot:: [id number] Expiration date: 02/25/2022   Post-op Post injection exam found visual acuity of at least counting fingers. The patient tolerated the procedure well. There were no complications. The patient received written and verbal post procedure care education. Post injection medications were not given.      Intravitreal Injection, Pharmacologic Agent - OS - Left Eye       Time Out 01/27/2022. 1:57 PM. Confirmed correct patient, procedure, site, and patient consented.   Anesthesia Topical anesthesia was used. Anesthetic medications included Lidocaine 2%, Proparacaine 0.5%.   Procedure Preparation included 5% betadine to ocular surface, eyelid speculum. A (32 g) needle was used.   Injection: 1.25 mg Bevacizumab 1.273m0.05ml   Route: Intravitreal, Site: Left Eye   NDC: : 37628-315-17Lot: [id number] date: 03/06/2022   Post-op Post injection exam found visual acuity of at least counting fingers. The patient tolerated the procedure well. There were no complications. The patient received written and verbal post procedure care education. Post injection medications were not given.            ASSESSMENT/PLAN:   ICD-10-CM   1. Proliferative diabetic retinopathy of both eyes with macular edema associated with type 2 diabetes mellitus (HCC)  E11.3513 OCT, Retina - OU - Both Eyes    Intravitreal Injection, Pharmacologic Agent - OD - Right Eye    Intravitreal Injection, Pharmacologic Agent - OS - Left Eye    Bevacizumab (AVASTIN) SOLN 1.25 mg    Bevacizumab (AVASTIN) SOLN 1.25 mg    2. Essential hypertension  I10     3. Hypertensive retinopathy of both eyes  H35.033     4. Combined forms of age-related cataract of both eyes  H25.813       1. 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, OU (OD  > OS)  - pt with history of PRP laser OU in [place] ~10+ yrs ago -- pt can't remember provider - exam shows scattered MA/IRH and 360 PRP, room for fill in OU - FA (9.19.22) shows late-leaking MA, vascular nonperfusion, +NV OU -- will need PRP fill in OU - s/p PRP OD (09.29.22) - s/p PRP OS (11.14.22) - s/p IVA OD #1 (09.19.22) - s/p IVA OS #1 (09.23.22) - s/p IVA OU #2 (10.21.22), #3 (12.23.22), #4 (01.06.23), #5 (02.03.23) - BCVA 20/20 OU - OCT shows OD: Persistent IRF - ?mild improvement; OS: Trace persistent IRF/cystic changes temporal macula at 4 wks - recommend IVA OU #6 today, 03.03.23 for DME - pt wishes to proceed  - RBA of procedure discussed, questions answered - informed consent obtained and signed - see procedure notes - f/u 5 weeks DFE/OCT, possible injection(s)  2,3. Hypertensive retinopathy OU - discussed importance of tight BP control - monitor    4. Mixed Cataract OU - The symptoms of cataract, surgical options, and treatments and risks were discussed with patient. - discussed diagnosis and progression - not yet visually significant - monitor for now  Ophthalmic Meds Ordered this visit:  Meds ordered this encounter  Medications   Bevacizumab (AVASTIN) SOLN 1.25 mg   Bevacizumab (AVASTIN) SOLN 1.25 mg     Return in about 5 weeks (around 03/03/2022) for f/u PDR OU, DFE, OCT.  There are no Patient Instructions on file for this visit.   Explained the diagnoses, plan, and follow up with the patient and they expressed understanding.  Patient expressed understanding of the importance of proper follow up care.   This document serves as a record of services personally performed by Gardiner Sleeper, MD, PhD. It was created on their behalf by Leonie Douglas, an ophthalmic technician. The creation of this record is the provider's dictation and/or activities during the visit.    Electronically signed by: Leonie Douglas COA, 01/27/22  3:42 PM  This document serves as a  record of services personally performed by Gardiner Sleeper, MD, PhD. It was created on their behalf by San Jetty. Owens Shark, OA an ophthalmic technician. The creation of this record is the provider's dictation and/or activities during the visit.    Electronically signed by: San Jetty. Owens Shark, [place] 03.03.2023 3:42 PM  Gardiner Sleeper, M.D., Ph.D. Diseases & Surgery of the Retina and Vitreous [facility]  I have reviewed the above documentation for accuracy and completeness, and I agree with the above. Gardiner Sleeper, M.D., Ph.D. 01/27/22 3:42 PM  Abbreviations: M myopia (nearsighted); A astigmatism; H hyperopia (farsighted); P presbyopia; Mrx spectacle prescription;  CTL contact lenses; OD right eye; OS left eye; OU both eyes  XT exotropia; ET esotropia; PEK punctate epithelial keratitis; PEE punctate epithelial erosions; DES dry eye syndrome; MGD meibomian gland dysfunction; ATs artificial tears; PFAT's preservative free artificial tears; [place] nuclear sclerotic cataract; PSC posterior subcapsular cataract; ERM epi-retinal membrane; PVD posterior vitreous detachment; RD retinal detachment; DM diabetes mellitus; DR diabetic retinopathy; NPDR non-proliferative diabetic retinopathy; PDR proliferative diabetic retinopathy; CSME clinically significant macular edema; DME diabetic macular edema; dbh dot blot hemorrhages; CWS cotton wool spot; POAG primary open angle glaucoma; C/D cup-to-disc ratio; HVF humphrey  visual field; GVF goldmann visual field; OCT optical coherence tomography; IOP intraocular pressure; BRVO Branch retinal vein occlusion; CRVO central retinal vein occlusion; CRAO central retinal artery occlusion; BRAO branch retinal artery occlusion; RT retinal tear; SB scleral buckle; PPV pars plana vitrectomy; VH Vitreous hemorrhage; PRP panretinal laser photocoagulation; IVK intravitreal kenalog; VMT vitreomacular traction; MH Macular hole;  NVD neovascularization of the disc; NVE  neovascularization elsewhere; AREDS age related eye disease study; ARMD age related macular degeneration; POAG primary open angle glaucoma; EBMD epithelial/anterior basement membrane dystrophy; ACIOL anterior chamber intraocular lens; IOL intraocular lens; PCIOL posterior chamber intraocular lens; Phaco/IOL phacoemulsification with intraocular lens placement; [place] photorefractive keratectomy; LASIK laser assisted in situ keratomileusis; HTN hypertension; DM diabetes mellitus; COPD chronic obstructive pulmonary disease

## 2022-01-27 ENCOUNTER — Ambulatory Visit (INDEPENDENT_AMBULATORY_CARE_PROVIDER_SITE_OTHER): Payer: No Typology Code available for payment source | Admitting: Ophthalmology

## 2022-01-27 ENCOUNTER — Encounter (INDEPENDENT_AMBULATORY_CARE_PROVIDER_SITE_OTHER): Payer: Self-pay | Admitting: Ophthalmology

## 2022-01-27 ENCOUNTER — Other Ambulatory Visit: Payer: Self-pay

## 2022-01-27 DIAGNOSIS — H35033 Hypertensive retinopathy, bilateral: Secondary | ICD-10-CM

## 2022-01-27 DIAGNOSIS — H25813 Combined forms of age-related cataract, bilateral: Secondary | ICD-10-CM

## 2022-01-27 DIAGNOSIS — E113513 Type 2 diabetes mellitus with proliferative diabetic retinopathy with macular edema, bilateral: Secondary | ICD-10-CM

## 2022-01-27 DIAGNOSIS — I10 Essential (primary) hypertension: Secondary | ICD-10-CM

## 2022-01-27 MED ORDER — BEVACIZUMAB CHEMO INJECTION 1.25MG/0.05ML SYRINGE FOR KALEIDOSCOPE
1.2500 mg | INTRAVITREAL | Status: AC | PRN
  Administered 2022-01-27: 1.25 mg via INTRAVITREAL

## 2022-01-30 ENCOUNTER — Ambulatory Visit: Payer: No Typology Code available for payment source | Admitting: Family Medicine

## 2022-02-08 ENCOUNTER — Other Ambulatory Visit: Payer: Self-pay | Admitting: Family Medicine

## 2022-02-21 NOTE — Progress Notes (Shared)
?[facility] Note ? ?02/24/2022 ? ?  ? ?CHIEF COMPLAINT ?Patient presents for No chief complaint on file. ? ? ?HISTORY OF PRESENT ILLNESS: ?Edward Riley is a 56 y.o. male who presents to the clinic today for:  ? ? ? ?Referring physician: ?Luetta Nutting, DO ?[address]  ?[address] ?[place],  [place] [address] ? ?HISTORICAL INFORMATION:  ? ?Selected notes from the [place] ?Referred by Dr. Luetta Nutting for diabetic eye exam ?LEE: [facility] ?Ocular Hx- ?PMH- ?  ? ?CURRENT MEDICATIONS: ?No current outpatient medications on file. (Ophthalmic Drugs)  ? ?No current facility-administered medications for this visit. (Ophthalmic Drugs)  ? ?Current Outpatient Medications (Other)  ?Medication Sig  ? AMBULATORY NON FORMULARY MEDICATION Bayer test strips and lancets. Use to check blood sugar twice a day. Dx: Type Two Diabetes Mellitus  ? amLODipine (NORVASC) 10 MG tablet TAKE 1 TABLET(10 MG) BY MOUTH DAILY  ? BD PEN NEEDLE NANO U/F 32G X 4 MM MISC USE AS DIRECTED  ? Blood Glucose Monitoring Suppl (ONE TOUCH ULTRA 2) w/Device KIT Use to check blood sugar twice a day. Send strips and lancets, quantity 100 refills 3.  Dx: Type Two Diabetes Mellitus  ? glucose blood (ONE TOUCH ULTRA TEST) test strip Use as instructed  ? hydrochlorothiazide (HYDRODIURIL) 25 MG tablet TAKE 1 TABLET(25 MG) BY MOUTH DAILY  ? Insulin Pen Needle (PEN NEEDLES) 31G X 8 MM MISC Use the pen needles for both the insulin and victoza pens daily  ? Lancet Device MISC Check blood sugar once or twice daily as needed.  ? lisinopril (ZESTRIL) 20 MG tablet Take 1 tablet (20 mg total) by mouth every morning.  ? MOUNJARO 5 MG/0.5ML Pen INJECT 5 MG UNDER THE SKIN ONCE WEEKLY  ? pioglitazone (ACTOS) 45 MG tablet Take 1 tablet (45 mg total) by mouth daily.  ? predniSONE (DELTASONE) 20 MG tablet Take 2 tablets (40 mg total) by mouth daily with breakfast.  ? rosuvastatin (CRESTOR) 20 MG tablet Take 1  tablet (20 mg total) by mouth daily.  ? tadalafil (CIALIS) 20 MG tablet Take 1 tablet (20 mg total) by mouth every other day.  ? tirzepatide (MOUNJARO) 10 MG/0.5ML Pen Inject 10 mg into the skin once a week.  ? tirzepatide (MOUNJARO) 7.5 MG/0.5ML Pen Inject 7.5 mg into the skin once a week. Take for 28 days and increase to 66m  ? XIGDUO XR 08-999 MG TB24 TAKE ONE TABLET BY MOUTH DAILY  ? ?No current facility-administered medications for this visit. (Other)  ? ?REVIEW OF SYSTEMS: ? ? ? ?ALLERGIES ?No Known Allergies ? ?PAST MEDICAL HISTORY ?Past Medical History:  ?Diagnosis Date  ? [place]   ? Hyperlipidemia   ? Hypertension   ? Obese   ? Retinal hemorrhage, both eyes   ? Sessile colonic polyp 02/04/2019  ? Colonoscopy February 2020.  ? ?No past surgical history on file. ? ?FAMILY HISTORY ?Family History  ?Problem Relation Age of Onset  ? Arthritis Mother   ? Hyperlipidemia Mother   ? Hypertension Mother   ? Cancer Father 630 ?     Prostate (Metastatic) Lung, Esophagus, Stomach  ? Alcohol abuse Father   ? Kidney disease Brother   ? Hypertension Brother   ? Cancer Paternal Aunt   ?     Breast Cancer  ? Alzheimer's disease Maternal Grandmother   ? Heart disease Paternal Grandmother   ? Diabetes  Brother   ? Alcohol abuse Maternal Uncle   ? ?SOCIAL HISTORY ?Social History  ? ?Tobacco Use  ? Smoking status: Never  ? Smokeless tobacco: Never  ?Vaping Use  ? Vaping Use: Never used  ?Substance Use Topics  ? Alcohol use: Yes  ?  Comment: Rarely  ? Drug use: No  ?  ? ?  ?OPHTHALMIC EXAM: ?Not recorded ?  ? ?IMAGING AND PROCEDURES  ?Imaging and Procedures for 02/24/2022 ? ? ?  ?  ? ?  ?ASSESSMENT/PLAN: ?  ICD-10-CM   ?1. Proliferative diabetic retinopathy of both eyes with macular edema associated with type 2 diabetes mellitus ([place])  K44.0102   ?  ?2. Essential hypertension  I10   ?  ?3. Hypertensive retinopathy of both eyes  H35.033   ?  ?4. Combined forms of age-related cataract of both  eyes  H25.813   ?  ? ? ? ?1. 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, OU (OD > OS) ? - pt with history of PRP laser OU in [place] ~10+ yrs ago -- pt can't remember provider ?- exam shows scattered MA/IRH and 360 PRP, room for fill in OU ?- FA (9.19.22) shows late-leaking MA, vascular nonperfusion, +NV OU -- will need PRP fill in OU ?- s/p PRP OD (09.29.22) ?- s/p PRP OS (11.14.22) ?- s/p IVA OD #1 (09.19.22) ?- s/p IVA OS #1 (09.23.22) ?- s/p IVA OU #2 (10.21.22), #3 (12.23.22), #4 (01.06.23), #5 (02.03.23), #6 (03.03.23) ?- BCVA 20/20 OU ?- OCT shows OD: Persistent IRF - ?mild improvement; OS: Trace persistent IRF/cystic changes temporal macula at 4 wks ?- recommend IVA OU #7 today, 03.31.23 for DME ?- pt wishes to proceed  ?- RBA of procedure discussed, questions answered ?- informed consent obtained and signed ?- see procedure notes ?- f/u 5 weeks DFE/OCT, possible injection(s) ? ?2,3. Hypertensive retinopathy OU ?- discussed importance of tight BP control ?- monitor  ? ?4. Mixed Cataract OU ?- The symptoms of cataract, surgical options, and treatments and risks were discussed with patient. ?- discussed diagnosis and progression ?- not yet visually significant ?- monitor for now ? ?Ophthalmic Meds Ordered this visit:  ?No orders of the defined types were placed in this encounter. ?  ? ?No follow-ups on file. ? ?There are no Patient Instructions on file for this visit.  ? ?Explained the diagnoses, plan, and follow up with the patient and they expressed understanding.  Patient expressed understanding of the importance of proper follow up care.  ? ?This document serves as a record of services personally performed by Gardiner Sleeper, MD, PhD. It was created on their behalf by Leonie Douglas, an ophthalmic technician. The creation of this record is the provider's dictation and/or activities during the visit.   ? ?Electronically signed by: Leonie Douglas COA, 02/21/22  1:33 PM ? ? ?Gardiner Sleeper, M.D.,  Ph.D. ?Diseases & Surgery of the Retina and Vitreous ?[place] ? ? ?Abbreviations: ?M myopia (nearsighted); A astigmatism; H hyperopia (farsighted); P presbyopia; Mrx spectacle prescription;  CTL contact lenses; OD right eye; OS left eye; OU both eyes  XT exotropia; ET esotropia; PEK punctate epithelial keratitis; PEE punctate epithelial erosions; DES dry eye syndrome; MGD meibomian gland dysfunction; ATs artificial tears; PFAT's preservative free artificial tears; [place] nuclear sclerotic cataract; PSC posterior subcapsular cataract; ERM epi-retinal membrane; PVD posterior vitreous detachment; RD retinal detachment; DM diabetes mellitus; DR diabetic retinopathy; NPDR non-proliferative diabetic retinopathy; PDR proliferative diabetic retinopathy; CSME clinically significant macular  edema; DME diabetic macular edema; dbh dot blot hemorrhages; CWS cotton wool spot; POAG primary open angle glaucoma; C/D cup-to-disc ratio; HVF humphrey visual field; GVF goldmann visual field; OCT optical coherence tomography; IOP intraocular pressure; BRVO Branch retinal vein occlusion; CRVO central retinal vein occlusion; CRAO central retinal artery occlusion; BRAO branch retinal artery occlusion; RT retinal tear; SB scleral buckle; PPV pars plana vitrectomy; VH Vitreous hemorrhage; PRP panretinal laser photocoagulation; IVK intravitreal kenalog; VMT vitreomacular traction; MH Macular hole;  NVD neovascularization of the disc; NVE neovascularization elsewhere; AREDS age related eye disease study; ARMD age related macular degeneration; POAG primary open angle glaucoma; EBMD epithelial/anterior basement membrane dystrophy; ACIOL anterior chamber intraocular lens; IOL intraocular lens; PCIOL posterior chamber intraocular lens; Phaco/IOL phacoemulsification with intraocular lens placement; [place] photorefractive keratectomy; LASIK laser assisted in situ keratomileusis; HTN hypertension; DM diabetes mellitus; COPD  chronic obstructive pulmonary disease ? ?

## 2022-02-24 ENCOUNTER — Encounter (INDEPENDENT_AMBULATORY_CARE_PROVIDER_SITE_OTHER): Payer: No Typology Code available for payment source | Admitting: Ophthalmology

## 2022-02-24 DIAGNOSIS — H25813 Combined forms of age-related cataract, bilateral: Secondary | ICD-10-CM

## 2022-02-24 DIAGNOSIS — I10 Essential (primary) hypertension: Secondary | ICD-10-CM

## 2022-02-24 DIAGNOSIS — E113513 Type 2 diabetes mellitus with proliferative diabetic retinopathy with macular edema, bilateral: Secondary | ICD-10-CM

## 2022-02-24 DIAGNOSIS — H35033 Hypertensive retinopathy, bilateral: Secondary | ICD-10-CM

## 2022-03-01 NOTE — Progress Notes (Signed)
?[facility] Note ? ?03/03/2022 ? ?  ? ?CHIEF COMPLAINT ?Patient presents for Retina Follow Up ? ? ?HISTORY OF PRESENT ILLNESS: ?Edward Riley is a 56 y.o. male who presents to the clinic today for:  ? ?HPI   ? ? Retina Follow Up   ?Patient presents with  Diabetic Retinopathy.  In both eyes.  Severity is moderate.  Duration of 5 weeks.  Since onset it is stable.  I, the attending physician,  performed the HPI with the patient and updated documentation appropriately. ? ?  ?  ? ? Comments   ?Patient states vision the same OU. ? ?  ?  ?Last edited by Bernarda Caffey, MD on 03/04/2022  2:52 PM.  ?  ? ? ?Referring physician: ?Luetta Nutting, DO ?[address]  ?[address] ?[place],  [place] [address] ? ?HISTORICAL INFORMATION:  ? ?Selected notes from the [facility] ?Referred by Dr. Luetta Nutting for diabetic eye exam ?LEE: [facility] ?Ocular Hx- ?PMH- ?  ? ?CURRENT MEDICATIONS: ?No current outpatient medications on file. (Ophthalmic Drugs)  ? ?No current facility-administered medications for this visit. (Ophthalmic Drugs)  ? ?Current Outpatient Medications (Other)  ?Medication Sig  ? AMBULATORY NON FORMULARY MEDICATION Bayer test strips and lancets. Use to check blood sugar twice a day. Dx: Type Two Diabetes Mellitus  ? amLODipine (NORVASC) 10 MG tablet TAKE 1 TABLET(10 MG) BY MOUTH DAILY  ? BD PEN NEEDLE NANO U/F 32G X 4 MM MISC USE AS DIRECTED  ? Blood Glucose Monitoring Suppl (ONE TOUCH ULTRA 2) w/Device KIT Use to check blood sugar twice a day. Send strips and lancets, quantity 100 refills 3.  Dx: Type Two Diabetes Mellitus  ? glucose blood (ONE TOUCH ULTRA TEST) test strip Use as instructed  ? hydrochlorothiazide (HYDRODIURIL) 25 MG tablet TAKE 1 TABLET(25 MG) BY MOUTH DAILY  ? Insulin Pen Needle (PEN NEEDLES) 31G X 8 MM MISC Use the pen needles for both the insulin and victoza pens daily  ? Lancet Device MISC Check blood sugar once or twice daily as needed.   ? lisinopril (ZESTRIL) 20 MG tablet Take 1 tablet (20 mg total) by mouth every morning.  ? MOUNJARO 5 MG/0.5ML Pen INJECT 5 MG UNDER THE SKIN ONCE WEEKLY  ? pioglitazone (ACTOS) 45 MG tablet Take 1 tablet (45 mg total) by mouth daily.  ? rosuvastatin (CRESTOR) 20 MG tablet Take 1 tablet (20 mg total) by mouth daily.  ? tadalafil (CIALIS) 20 MG tablet Take 1 tablet (20 mg total) by mouth every other day.  ? tirzepatide (MOUNJARO) 10 MG/0.5ML Pen Inject 10 mg into the skin once a week.  ? tirzepatide (MOUNJARO) 7.5 MG/0.5ML Pen Inject 7.5 mg into the skin once a week. Take for 28 days and increase to 51m  ? XIGDUO XR 08-999 MG TB24 TAKE ONE TABLET BY MOUTH DAILY  ? predniSONE (DELTASONE) 20 MG tablet Take 2 tablets (40 mg total) by mouth daily with breakfast. (Patient not taking: Reported on 03/03/2022)  ? ?No current facility-administered medications for this visit. (Other)  ? ?REVIEW OF SYSTEMS: ?ROS   ?Positive for: Endocrine, Cardiovascular, Eyes ?Negative for: Constitutional, Gastrointestinal, Neurological, Skin, Genitourinary, Musculoskeletal, HENT, Respiratory, Psychiatric, Allergic/Imm, Heme/Lymph ?Last edited by BRoselee NovaD, COT on 03/03/2022  2:11 PM.  ?  ? ?ALLERGIES ?No Known Allergies ? ?PAST MEDICAL HISTORY ?Past Medical History:  ?Diagnosis Date  ? [place]   ? Hyperlipidemia   ?  Hypertension   ? Obese   ? Retinal hemorrhage, both eyes   ? Sessile colonic polyp 02/04/2019  ? Colonoscopy February 2020.  ? ?History reviewed. No pertinent surgical history. ? ?FAMILY HISTORY ?Family History  ?Problem Relation Age of Onset  ? Arthritis Mother   ? Hyperlipidemia Mother   ? Hypertension Mother   ? Cancer Father 67  ?     Prostate (Metastatic) Lung, Esophagus, Stomach  ? Alcohol abuse Father   ? Kidney disease Brother   ? Hypertension Brother   ? Cancer Paternal Aunt   ?     Breast Cancer  ? Alzheimer's disease Maternal Grandmother   ? Heart disease Paternal Grandmother   ?  Diabetes Brother   ? Alcohol abuse Maternal Uncle   ? ?SOCIAL HISTORY ?Social History  ? ?Tobacco Use  ? Smoking status: Never  ? Smokeless tobacco: Never  ?Vaping Use  ? Vaping Use: Never used  ?Substance Use Topics  ? Alcohol use: Yes  ?  Comment: Rarely  ? Drug use: No  ?  ? ?  ?OPHTHALMIC EXAM: ?Base Eye Exam   ? ? Visual Acuity (Snellen - Linear)   ? ?   Right Left  ? Dist cc 20/30 20/25  ? Dist ph cc 20/25 20/20 -2  ? ? Correction: Glasses  ? ?  ?  ? ? Tonometry (Tonopen, 2:16 PM)   ? ?   Right Left  ? Pressure 12 13  ? ?  ?  ? ? Pupils   ? ?   Dark Light Shape React APD  ? Right 3 2 Round Brisk None  ? Left 3 2 Round Brisk None  ? ?  ?  ? ? Visual Fields (Counting fingers)   ? ?   Left Right  ?  Full Full  ? ?  ?  ? ? Extraocular Movement   ? ?   Right Left  ?  Full, Ortho Full, Ortho  ? ?  ?  ? ? Neuro/Psych   ? ? Oriented x3: Yes  ? Mood/Affect: Normal  ? ?  ?  ? ? Dilation   ? ? Both eyes: 1.0% Mydriacyl, 2.5% Phenylephrine @ 2:16 PM  ? ?  ?  ? ?  ? ?Slit Lamp and Fundus Exam   ? ? Slit Lamp Exam   ? ?   Right Left  ? Lids/Lashes Dermatochalasis - upper lid Dermatochalasis - upper lid  ? Conjunctiva/Sclera nasal pingeucula nasal and temporal pinguecula  ? Cornea trace PEE trace PEE  ? Anterior Chamber Deep and quiet Deep and quiet  ? Iris Round and dilated, No NVI Round and dilated, No NVI  ? Lens 2+ Nuclear sclerosis, 2+ Cortical cataract 2+ Nuclear sclerosis, 2+ Cortical cataract  ? Anterior Vitreous Mild Vitreous syneresis Mild Vitreous syneresis  ? ?  ?  ? ? Fundus Exam   ? ?   Right Left  ? Disc Pink and Sharp, PPA, Compact mild tilt, Pink and Sharp, PPA  ? C/D Ratio 0.2 0.2  ? Macula good foveal reflex, trace MA, no exudates, +perifoveal cystic changes -- improved, no great targets for focal laser Good foveal reflex, Microaneurysms -- improved, trace Cystic changes temporal macula - improved  ? Vessels ?NV temporal periphery, attenuated, Tortuous mild attenuation, mild Copper wiring, mild AV crossing  changes, trace focal fibrosis along distal IT arcade, ?NV temporal periphery  ? Periphery Attached, 360 PRP with good laser fill in, pre-retinal heme temporal periphery  improved -- ?macroaneurysms - improving Attached, 360 PRP with good fill in, scattered IRH/DBH - improved  ? ?  ?  ? ?  ? ?Refraction   ? ? Wearing Rx   ? ?   Sphere Cylinder Axis  ? Right -2.50 +1.00 138  ? Left -2.25 +1.00 173  ? ?  ?  ? ?  ? ?IMAGING AND PROCEDURES  ?Imaging and Procedures for 03/03/2022 ? ?OCT, Retina - OU - Both Eyes   ? ?   ?Right Eye ?Quality was good. Central Foveal Thickness: 360. Progression has improved. Findings include intraretinal fluid, no SRF, vitreomacular adhesion , normal foveal contour (Interval improvement in perifoveal IRF).  ? ?Left Eye ?Quality was good. Central Foveal Thickness: 349. Progression has improved. Findings include normal foveal contour, no SRF, vitreomacular adhesion , no IRF (IRF temporal mac improved - just trace cystic changes remain).  ? ?Notes ?*Images captured and stored on drive ? ?Diagnosis / Impression:  ?+DME OU ?OD:  Interval improvement in parafoveal IRF ?OS: IRF temporal mac improved - just trace cystic changes remain ? ?Clinical management:  ?See below ? ?Abbreviations: NFP - Normal foveal profile. CME - cystoid macular edema. PED - pigment epithelial detachment. IRF - intraretinal fluid. SRF - subretinal fluid. EZ - ellipsoid zone. ERM - epiretinal membrane. ORA - outer retinal atrophy. ORT - outer retinal tubulation. SRHM - subretinal hyper-reflective material. IRHM - intraretinal hyper-reflective material ? ? ?  ? ?Intravitreal Injection, Pharmacologic Agent - OD - Right Eye   ? ?   ?Time Out ?03/03/2022. 2:38 PM. Confirmed correct patient, procedure, site, and patient consented.  ? ?Anesthesia ?Topical anesthesia was used. Anesthetic medications included Lidocaine 2%, Proparacaine 0.5%.  ? ?Procedure ?Preparation included 5% betadine to ocular surface, eyelid speculum. A supplied  needle was used.  ? ?Injection: ?1.25 mg Bevacizumab 1.101m/0.05ml ?  Route: Intravitreal, Site: Right Eye ?  NDC: 501749-449-67 Lot: [id number], Expiration date: 04/05/2022  ? ?Post-op ?Post injection

## 2022-03-03 ENCOUNTER — Ambulatory Visit (INDEPENDENT_AMBULATORY_CARE_PROVIDER_SITE_OTHER): Payer: No Typology Code available for payment source | Admitting: Ophthalmology

## 2022-03-03 ENCOUNTER — Encounter (INDEPENDENT_AMBULATORY_CARE_PROVIDER_SITE_OTHER): Payer: Self-pay | Admitting: Ophthalmology

## 2022-03-03 DIAGNOSIS — H35033 Hypertensive retinopathy, bilateral: Secondary | ICD-10-CM

## 2022-03-03 DIAGNOSIS — I10 Essential (primary) hypertension: Secondary | ICD-10-CM

## 2022-03-03 DIAGNOSIS — E113513 Type 2 diabetes mellitus with proliferative diabetic retinopathy with macular edema, bilateral: Secondary | ICD-10-CM | POA: Diagnosis not present

## 2022-03-03 DIAGNOSIS — H25813 Combined forms of age-related cataract, bilateral: Secondary | ICD-10-CM

## 2022-03-04 ENCOUNTER — Encounter (INDEPENDENT_AMBULATORY_CARE_PROVIDER_SITE_OTHER): Payer: Self-pay | Admitting: Ophthalmology

## 2022-03-04 MED ORDER — BEVACIZUMAB CHEMO INJECTION 1.25MG/0.05ML SYRINGE FOR KALEIDOSCOPE
1.2500 mg | INTRAVITREAL | Status: AC | PRN
  Administered 2022-03-04: 1.25 mg via INTRAVITREAL

## 2022-03-17 ENCOUNTER — Ambulatory Visit (INDEPENDENT_AMBULATORY_CARE_PROVIDER_SITE_OTHER): Payer: No Typology Code available for payment source | Admitting: Family Medicine

## 2022-03-17 ENCOUNTER — Encounter: Payer: Self-pay | Admitting: Family Medicine

## 2022-03-17 VITALS — BP 126/78 | HR 86 | Ht 68.0 in | Wt 241.0 lb

## 2022-03-17 DIAGNOSIS — E1159 Type 2 diabetes mellitus with other circulatory complications: Secondary | ICD-10-CM

## 2022-03-17 DIAGNOSIS — N529 Male erectile dysfunction, unspecified: Secondary | ICD-10-CM

## 2022-03-17 DIAGNOSIS — E785 Hyperlipidemia, unspecified: Secondary | ICD-10-CM

## 2022-03-17 DIAGNOSIS — E1121 Type 2 diabetes mellitus with diabetic nephropathy: Secondary | ICD-10-CM

## 2022-03-17 DIAGNOSIS — E1169 Type 2 diabetes mellitus with other specified complication: Secondary | ICD-10-CM

## 2022-03-17 DIAGNOSIS — Z794 Long term (current) use of insulin: Secondary | ICD-10-CM | POA: Diagnosis not present

## 2022-03-17 DIAGNOSIS — I152 Hypertension secondary to endocrine disorders: Secondary | ICD-10-CM

## 2022-03-17 LAB — POCT GLYCOSYLATED HEMOGLOBIN (HGB A1C): HbA1c, POC (controlled diabetic range): 7.1 % — AB (ref 0.0–7.0)

## 2022-03-17 MED ORDER — TIRZEPATIDE 12.5 MG/0.5ML ~~LOC~~ SOAJ: 12.5000 mg | SUBCUTANEOUS | 1 refills | Status: DC

## 2022-03-17 NOTE — Assessment & Plan Note (Signed)
BP remains well controlled.  Recommend continuation of current medications for management of HTN.   

## 2022-03-17 NOTE — Progress Notes (Signed)
?Edward Riley - 56 y.o. male MRN [id number]  Date of birth: 1966/10/22 ? ?Subjective ?No chief complaint on file. ? ? ?HPI ?Edward Riley is a 56 y.o. male here today for follow up visit. Reports he is doing well.   ? ?Diabetes managed with mounjaro, actos and xigduo.  Diabetes complicated by retinopathy.  Tolerating current regimen well and denies side effects including symptoms of hypoglycemia.  Weight is down 8 lbs since last visit. Tolerating crestor well for management of associated HLD.   ? ?BP remains well controlled with lisinopril, amlodipine and HCTZ.  Denies chest pain, shortness of breath, palpitations, headache or vision changes.  ? ?ROS:  A comprehensive ROS was completed and negative except as noted per HPI ? ?No Known Allergies ? ?Past Medical History:  ?Diagnosis Date  ? Diabetes mellitus without complication (HCC)   ? Hyperlipidemia   ? Hypertension   ? Obese   ? Retinal hemorrhage, both eyes   ? Sessile colonic polyp 02/04/2019  ? Colonoscopy February 2020.  ? ? ?No past surgical history on file. ? ?Social History  ? ?Socioeconomic History  ? Marital status: Single  ?  Spouse name: Not on file  ? Number of children: Not on file  ? Years of education: 52  ? Highest education level: Not on file  ?Occupational History  ? Occupation: TRUCK DRIVER   ?  Employer: other - [employer]  ?  Comment: [employer]  ?Tobacco Use  ? Smoking status: Never  ? Smokeless tobacco: Never  ?Vaping Use  ? Vaping Use: Never used  ?Substance and Sexual Activity  ? Alcohol use: Yes  ?  Comment: Rarely  ? Drug use: No  ? Sexual activity: Yes  ?Other Topics Concern  ? Not on file  ?Social History Narrative  ? Marital Status: Single   ? Children: None  ? Pets: None   ? Living Situation: Lives alone  ? Occupation: Truck Sports administrator)    ? Education:  12 th Grade   ? Tobacco Use/Exposure:  None   ? Alcohol Use:  Rarely  ? Drug Use:  None  ? Diet:  Regular  ? Exercise:  Very Active at work   ? Hobbies:   Racing and Boating   ?   ?   ?   ?   ? ?Social Determinants of Health  ? ?Financial Resource Strain: Not on file  ?Food Insecurity: Not on file  ?Transportation Needs: Not on file  ?Physical Activity: Not on file  ?Stress: Not on file  ?Social Connections: Not on file  ? ? ?Family History  ?Problem Relation Age of Onset  ? Arthritis Mother   ? Hyperlipidemia Mother   ? Hypertension Mother   ? Cancer Father 49  ?     Prostate (Metastatic) Lung, Esophagus, Stomach  ? Alcohol abuse Father   ? Kidney disease Brother   ? Hypertension Brother   ? Cancer Paternal Aunt   ?     Breast Cancer  ? Alzheimer's disease Maternal Grandmother   ? Heart disease Paternal Grandmother   ? Diabetes Brother   ? Alcohol abuse Maternal Uncle   ? ? ?Health Maintenance  ?Topic Date Due  ? OPHTHALMOLOGY EXAM  12/21/2019  ? COVID-19 Vaccine (2 - Booster for Janssen series) 05/01/2020  ? Zoster Vaccines- Shingrix (2 of 2) 08/29/2021  ? Hepatitis C Screening  07/04/2022 (Originally 04/04/1986)  ? HIV Screening  07/04/2022 (Originally 04/05/1983)  ? HEMOGLOBIN A1C  05/01/2022  ? INFLUENZA VACCINE  06/27/2022  ? FOOT EXAM  07/04/2022  ? TETANUS/TDAP  11/17/2026  ? COLONOSCOPY (Pts 45-4yrs Insurance coverage will need to be confirmed)  01/24/2029  ? HPV VACCINES  Aged Out  ? ? ? ?----------------------------------------------------------------------------------------------------------------------------------------------------------------------------------------------------------------- ?Physical Exam ?There were no vitals taken for this visit. ? ?Physical Exam ?Constitutional:   ?   Appearance: Normal appearance.  ?Eyes:  ?   General: No scleral icterus. ?Cardiovascular:  ?   Rate and Rhythm: Normal rate and regular rhythm.  ?Pulmonary:  ?   Effort: Pulmonary effort is normal.  ?   Breath sounds: Normal breath sounds.  ?Musculoskeletal:  ?   Cervical back: Neck supple.  ?Neurological:  ?   Mental Status: He is alert.  ?Psychiatric:     ?   Mood and  Affect: Mood normal.     ?   Behavior: Behavior normal.  ? ? ?------------------------------------------------------------------------------------------------------------------------------------------------------------------------------------------------------------------- ?Assessment and Plan ? ?Hyperlipidemia associated with type 2 diabetes mellitus (HCC) ?Doing well with rosuvastatin.  Recommend continuation at current strength.   ? ?Hypertension associated with diabetes (HCC) ?BP remains well controlled.  Recommend continuation of current medications for management of HTN.   ? ?Type 2 diabetes mellitus with diabetic nephropathy ?Blood sugars continue to improve.  Continue titration of mounjaro and encouraged to continue to work on dietary changes.   ? ?Erectile dysfunction ?Tadalfil is not working. Failed sildenafil as well.  Referral placed to urology.  ? ? ?No orders of the defined types were placed in this encounter. ? ? ?No follow-ups on file. ? ? ? ?This visit occurred during the SARS-CoV-2 public health emergency.  Safety protocols were in place, including screening questions prior to the visit, additional usage of staff PPE, and extensive cleaning of exam room while observing appropriate contact time as indicated for disinfecting solutions.  ? ?

## 2022-03-17 NOTE — Assessment & Plan Note (Signed)
Doing well with rosuvastatin.  Recommend continuation at current strength.   ?

## 2022-03-17 NOTE — Patient Instructions (Addendum)
Great to see you today! ?Once you complete 1 month at the 10mg  increase to 12.5mg .   ?Follow up in 1 month.  ?

## 2022-03-17 NOTE — Assessment & Plan Note (Signed)
Tadalfil is not working. Failed sildenafil as well.  Referral placed to urology.  ?

## 2022-03-17 NOTE — Assessment & Plan Note (Signed)
Blood sugars continue to improve.  Continue titration of mounjaro and encouraged to continue to work on dietary changes.   ?

## 2022-04-08 ENCOUNTER — Other Ambulatory Visit: Payer: Self-pay | Admitting: Family Medicine

## 2022-04-10 ENCOUNTER — Encounter: Payer: Self-pay | Admitting: Family Medicine

## 2022-04-10 NOTE — Progress Notes (Signed)
[facility] Note  04/14/2022     CHIEF COMPLAINT Patient presents for Retina Follow Up   HISTORY OF PRESENT ILLNESS: Edward Riley is a 56 y.o. male who presents to the clinic today for:   HPI     Retina Follow Up   Patient presents with  Diabetic Retinopathy.  In both eyes.  This started 6 weeks ago.  I, the attending physician,  performed the HPI with the patient and updated documentation appropriately.        Comments   Patient here for 6 months retina follow up for PDR OU. Patient states vision doing the same. No difference. No eye pain. Still has floater from the first shot.       Last edited by Bernarda Caffey, MD on 04/18/2022  1:53 AM.       Referring physician: Luetta Nutting, DO [address]  [address] [place],  [place] [address]  HISTORICAL INFORMATION:   Selected notes from the MEDICAL RECORD NUMBER Referred by Dr. Luetta Nutting for diabetic eye exam LEE: [facility] Ocular Hx- PMH-    CURRENT MEDICATIONS: No current outpatient medications on file. (Ophthalmic Drugs)   No current facility-administered medications for this visit. (Ophthalmic Drugs)   Current Outpatient Medications (Other)  Medication Sig   AMBULATORY NON FORMULARY MEDICATION Bayer test strips and lancets. Use to check blood sugar twice a day. Dx: Type Two Diabetes Mellitus   amLODipine (NORVASC) 10 MG tablet TAKE 1 TABLET(10 MG) BY MOUTH DAILY   BD PEN NEEDLE NANO U/F 32G X 4 MM MISC USE AS DIRECTED   Blood Glucose Monitoring Suppl (ONE TOUCH ULTRA 2) w/Device KIT Use to check blood sugar twice a day. Send strips and lancets, quantity 100 refills 3.  Dx: Type Two Diabetes Mellitus   Dapagliflozin-metFORMIN HCl ER (XIGDUO XR) 08-999 MG TB24 TAKE ONE TABLET BY MOUTH DAILY   glucose blood (ONE TOUCH ULTRA TEST) test strip Use as instructed   hydrochlorothiazide (HYDRODIURIL) 25 MG tablet TAKE 1 TABLET(25 MG) BY MOUTH DAILY    Insulin Pen Needle (PEN NEEDLES) 31G X 8 MM MISC Use the pen needles for both the insulin and victoza pens daily   Lancet Device MISC Check blood sugar once or twice daily as needed.   lisinopril (ZESTRIL) 20 MG tablet Take 1 tablet (20 mg total) by mouth every morning.   pioglitazone (ACTOS) 45 MG tablet Take 1 tablet (45 mg total) by mouth daily.   predniSONE (DELTASONE) 20 MG tablet Take 2 tablets (40 mg total) by mouth daily with breakfast.   rosuvastatin (CRESTOR) 20 MG tablet Take 1 tablet (20 mg total) by mouth daily.   tadalafil (CIALIS) 20 MG tablet Take 1 tablet (20 mg total) by mouth every other day.   tirzepatide (MOUNJARO) 12.5 MG/0.5ML Pen Inject 12.5 mg into the skin once a week.   No current facility-administered medications for this visit. (Other)   REVIEW OF SYSTEMS: ROS   Positive for: Endocrine, Cardiovascular, Eyes Negative for: Constitutional, Gastrointestinal, Neurological, Skin, Genitourinary, Musculoskeletal, HENT, Respiratory, Psychiatric, Allergic/Imm, Heme/Lymph Last edited by Theodore Demark, COA on 04/14/2022  1:49 PM.     ALLERGIES No Known Allergies  PAST MEDICAL HISTORY Past Medical History:  Diagnosis Date   Diabetes mellitus without complication ([facility])    Hyperlipidemia    Hypertension    Obese    Retinal hemorrhage, both eyes    Sessile colonic polyp 02/04/2019   Colonoscopy February  2020.   History reviewed. No pertinent surgical history.  FAMILY HISTORY Family History  Problem Relation Age of Onset   Arthritis Mother    Hyperlipidemia Mother    Hypertension Mother    Cancer Father 20       Prostate (Metastatic) Lung, Esophagus, Stomach   Alcohol abuse Father    Kidney disease Brother    Hypertension Brother    Cancer Paternal Aunt        Breast Cancer   Alzheimer's disease Maternal Grandmother    Heart disease Paternal Grandmother    Diabetes Brother    Alcohol abuse Maternal Uncle    SOCIAL HISTORY Social History   Tobacco  Use   Smoking status: Never   Smokeless tobacco: Never  Vaping Use   Vaping Use: Never used  Substance Use Topics   Alcohol use: Yes    Comment: Rarely   Drug use: No       OPHTHALMIC EXAM: Base Eye Exam     Visual Acuity (Snellen - Linear)       Right Left   Dist cc 20/25 -2 20/25 -1   Dist ph cc 20/20 -1 20/20 -2    Correction: Glasses         Tonometry (Tonopen, 1:46 PM)       Right Left   Pressure 14 13         Pupils       Dark Light Shape React APD   Right 3 2 Round Brisk None   Left 3 2 Round Brisk None         Visual Fields (Counting fingers)       Left Right    Full Full         Extraocular Movement       Right Left    Full, Ortho Full, Ortho         Neuro/Psych     Oriented x3: Yes   Mood/Affect: Normal         Dilation     Both eyes: 1.0% Mydriacyl, 2.5% Phenylephrine @ 1:46 PM           Slit Lamp and Fundus Exam     Slit Lamp Exam       Right Left   Lids/Lashes Dermatochalasis - upper lid Dermatochalasis - upper lid   Conjunctiva/Sclera nasal pingeucula nasal and temporal pinguecula   Cornea trace PEE trace PEE   Anterior Chamber Deep and quiet Deep and quiet   Iris Round and dilated, No NVI Round and dilated, No NVI   Lens 2+ Nuclear sclerosis, 2+ Cortical cataract 2+ Nuclear sclerosis, 2+ Cortical cataract   Anterior Vitreous Mild Vitreous syneresis Mild Vitreous syneresis         Fundus Exam       Right Left   Disc Pink and Sharp, PPA, Compact mild tilt, Pink and Sharp, PPA   C/D Ratio 0.2 0.2   Macula good foveal reflex, trace MA, no exudates, +perifoveal cystic changes -- improved, no great targets for focal laser Good foveal reflex, Microaneurysms -- improved, trace Cystic changes temporal macula - improved   Vessels ?NV temporal periphery, attenuated, Tortuous mild attenuation, mild Copper wiring, trace focal fibrosis along distal IT arcade, ?NV temporal periphery, Tortuous   Periphery Attached, 360  PRP with good laser fill in, pre-retinal heme temporal periphery improved, macroaneurysms - improving Attached, 360 PRP with good fill in, scattered IRH/DBH - improved  Refraction     Wearing Rx       Sphere Cylinder Axis   Right -2.50 +1.00 138   Left -2.25 +1.00 173           IMAGING AND PROCEDURES  Imaging and Procedures for 04/14/2022  OCT, Retina - OU - Both Eyes       Right Eye Quality was good. Central Foveal Thickness: 406. Progression has improved. Findings include intraretinal fluid, no SRF, vitreomacular adhesion , normal foveal contour (interval improvement in cystic changes).   Left Eye Quality was good. Central Foveal Thickness: 340. Progression has improved. Findings include normal foveal contour, no SRF, vitreomacular adhesion , no IRF (Stable improvement in IRF temporal mac - just trace cystic changes remain).   Notes *Images captured and stored on drive  Diagnosis / Impression:  +DME OU OD: Interval improvement in parafoveal IRF OS: Stable improvement in IRF temporal mac - just trace cystic changes remain  Clinical management:  See below  Abbreviations: NFP - Normal foveal profile. CME - cystoid macular edema. PED - pigment epithelial detachment. IRF - intraretinal fluid. SRF - subretinal fluid. EZ - ellipsoid zone. ERM - epiretinal membrane. ORA - outer retinal atrophy. ORT - outer retinal tubulation. SRHM - subretinal hyper-reflective material. IRHM - intraretinal hyper-reflective material      Intravitreal Injection, Pharmacologic Agent - OD - Right Eye       Time Out 04/14/2022. 2:17 PM. Confirmed correct patient, procedure, site, and patient consented.   Anesthesia Topical anesthesia was used. Anesthetic medications included Lidocaine 2%, Proparacaine 0.5%.   Procedure Preparation included 5% betadine to ocular surface, eyelid speculum. A (32g) needle was used.   Injection: 1.25 mg Bevacizumab 1.85m/0.05ml   Route:  Intravitreal, Site: Right Eye   NDC: 50242-060-01, Lot:: [id number] Expiration date: 06/02/2022   Post-op Post injection exam found visual acuity of at least counting fingers. The patient tolerated the procedure well. There were no complications. The patient received written and verbal post procedure care education. Post injection medications were not given.      Intravitreal Injection, Pharmacologic Agent - OS - Left Eye       Time Out 04/14/2022. 2:18 PM. Confirmed correct patient, procedure, site, and patient consented.   Anesthesia Topical anesthesia was used. Anesthetic medications included Lidocaine 2%, Proparacaine 0.5%.   Procedure Preparation included 5% betadine to ocular surface, eyelid speculum. A (32 g) needle was used.   Injection: 1.25 mg Bevacizumab 1.244m0.05ml   Route: Intravitreal, Site: Left Eye   NDC: : 41324-401-02Lot: : [id number] date: 06/11/2022   Post-op Post injection exam found visual acuity of at least counting fingers. The patient tolerated the procedure well. There were no complications. The patient received written and verbal post procedure care education. Post injection medications were not given.              ASSESSMENT/PLAN:   ICD-10-CM   1. Proliferative diabetic retinopathy of both eyes with macular edema associated with type 2 diabetes mellitus (HCC)  E11.3513 OCT, Retina - OU - Both Eyes    Intravitreal Injection, Pharmacologic Agent - OD - Right Eye    Intravitreal Injection, Pharmacologic Agent - OS - Left Eye    Bevacizumab (AVASTIN) SOLN 1.25 mg    Bevacizumab (AVASTIN) SOLN 1.25 mg    2. Essential hypertension  I10     3. Hypertensive retinopathy of both eyes  H35.033     4. Combined forms of age-related cataract of both eyes  H25.813  1. 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, OU (OD > OS)  - pt with history of PRP laser OU in [place] ~10+ yrs ago -- pt can't remember provider - exam shows scattered MA/IRH  and 360 PRP, room for fill in OU - FA (9.19.22) shows late-leaking MA, vascular nonperfusion, +NV OU -- will need PRP fill in OU - s/p PRP OD (09.29.22) - s/p PRP OS (11.14.22) - s/p IVA OD #1 (09.19.22) - s/p IVA OS #1 (09.23.22) - s/p IVA OU #2 (10.21.22), #3 (12.23.22), #4 (01.06.23), #5 (02.03.23), #6 (03.03.23), #7 (04.07.23) - BCVA 20/20 OU - OCT shows OD:  Interval improvement in parafoveal IRF; OS: Stable improvement in IRF temporal mac - just trace cystic changes remain at 6 weeks - recommend IVA OU #8 today, 05.19.23 for DME with f/u ext to 8 weeks - pt wishes to proceed  - RBA of procedure discussed, questions answered - informed consent obtained and signed - see procedure notes - f/u 8 weeks DFE/OCT, possible injection(s)  2,3. Hypertensive retinopathy OU - discussed importance of tight BP control - monitor   4. Mixed Cataract OU - The symptoms of cataract, surgical options, and treatments and risks were discussed with patient. - discussed diagnosis and progression - not yet visually significant - monitor for now  Ophthalmic Meds Ordered this visit:  Meds ordered this encounter  Medications   Bevacizumab (AVASTIN) SOLN 1.25 mg   Bevacizumab (AVASTIN) SOLN 1.25 mg     Return in about 8 weeks (around 06/09/2022) for f/u PDR OU, DFE, OCT.  There are no Patient Instructions on file for this visit.   Explained the diagnoses, plan, and follow up with the patient and they expressed understanding.  Patient expressed understanding of the importance of proper follow up care.   This document serves as a record of services personally performed by Gardiner Sleeper, MD, PhD. It was created on their behalf by Leonie Douglas, an ophthalmic technician. The creation of this record is the provider's dictation and/or activities during the visit.    Electronically signed by: Leonie Douglas COA, 04/18/22  1:58 AM   Gardiner Sleeper, M.D., Ph.D. Diseases & Surgery of the Retina and  Vitreous [facility]  I have reviewed the above documentation for accuracy and completeness, and I agree with the above. Gardiner Sleeper, M.D., Ph.D. 04/18/22 1:58 AM   Abbreviations: M myopia (nearsighted); A astigmatism; H hyperopia (farsighted); P presbyopia; Mrx spectacle prescription;  CTL contact lenses; OD right eye; OS left eye; OU both eyes  XT exotropia; ET esotropia; PEK punctate epithelial keratitis; PEE punctate epithelial erosions; DES dry eye syndrome; MGD meibomian gland dysfunction; ATs artificial tears; PFAT's preservative free artificial tears; Home nuclear sclerotic cataract; PSC posterior subcapsular cataract; ERM epi-retinal membrane; PVD posterior vitreous detachment; RD retinal detachment; DM diabetes mellitus; DR diabetic retinopathy; NPDR non-proliferative diabetic retinopathy; PDR proliferative diabetic retinopathy; CSME clinically significant macular edema; DME diabetic macular edema; dbh dot blot hemorrhages; CWS cotton wool spot; POAG primary open angle glaucoma; C/D cup-to-disc ratio; HVF humphrey visual field; GVF goldmann visual field; OCT optical coherence tomography; IOP intraocular pressure; BRVO Branch retinal vein occlusion; CRVO central retinal vein occlusion; CRAO central retinal artery occlusion; BRAO branch retinal artery occlusion; RT retinal tear; SB scleral buckle; PPV pars plana vitrectomy; VH Vitreous hemorrhage; PRP panretinal laser photocoagulation; IVK intravitreal kenalog; VMT vitreomacular traction; MH Macular hole;  NVD neovascularization of the disc; NVE neovascularization elsewhere; AREDS age related eye disease study;  ARMD age related macular degeneration; POAG primary open angle glaucoma; EBMD epithelial/anterior basement membrane dystrophy; ACIOL anterior chamber intraocular lens; IOL intraocular lens; PCIOL posterior chamber intraocular lens; Phaco/IOL phacoemulsification with intraocular lens placement; [place] photorefractive  keratectomy; LASIK laser assisted in situ keratomileusis; HTN hypertension; DM diabetes mellitus; COPD chronic obstructive pulmonary disease

## 2022-04-14 ENCOUNTER — Encounter (INDEPENDENT_AMBULATORY_CARE_PROVIDER_SITE_OTHER): Payer: Self-pay | Admitting: Ophthalmology

## 2022-04-14 ENCOUNTER — Ambulatory Visit (INDEPENDENT_AMBULATORY_CARE_PROVIDER_SITE_OTHER): Payer: No Typology Code available for payment source | Admitting: Ophthalmology

## 2022-04-14 DIAGNOSIS — I10 Essential (primary) hypertension: Secondary | ICD-10-CM | POA: Diagnosis not present

## 2022-04-14 DIAGNOSIS — H25813 Combined forms of age-related cataract, bilateral: Secondary | ICD-10-CM | POA: Diagnosis not present

## 2022-04-14 DIAGNOSIS — E113513 Type 2 diabetes mellitus with proliferative diabetic retinopathy with macular edema, bilateral: Secondary | ICD-10-CM

## 2022-04-14 DIAGNOSIS — H35033 Hypertensive retinopathy, bilateral: Secondary | ICD-10-CM | POA: Diagnosis not present

## 2022-04-18 ENCOUNTER — Encounter (INDEPENDENT_AMBULATORY_CARE_PROVIDER_SITE_OTHER): Payer: Self-pay | Admitting: Ophthalmology

## 2022-04-18 MED ORDER — BEVACIZUMAB CHEMO INJECTION 1.25MG/0.05ML SYRINGE FOR KALEIDOSCOPE
1.2500 mg | INTRAVITREAL | Status: AC | PRN
  Administered 2022-04-18: 1.25 mg via INTRAVITREAL

## 2022-06-06 NOTE — Progress Notes (Signed)
[facility] Note  06/09/2022     CHIEF COMPLAINT Patient presents for Retina Follow Up   HISTORY OF PRESENT ILLNESS: Edward Riley is a 56 y.o. male who presents to the clinic today for:   HPI     Retina Follow Up   Patient presents with  Diabetic Retinopathy.  In both eyes.  Duration of 8 weeks.  Since onset it is stable.  I, the attending physician,  performed the HPI with the patient and updated documentation appropriately.        Comments   8 week follow up PDR OU-  No changes since last visit.  Has lost 40lbs since Nov. 2022.       Last edited by Bernarda Caffey, MD on 06/09/2022  1:46 PM.    Pt states vision is stable, no changes in blood sugar or blood pressure  Referring physician: Luetta Nutting, DO [address] [place],  [place] [address]  HISTORICAL INFORMATION:   Selected notes from the MEDICAL RECORD NUMBER Referred by Dr. Luetta Nutting for diabetic eye exam LEE: [facility] Ocular Hx- PMH-    CURRENT MEDICATIONS: No current outpatient medications on file. (Ophthalmic Drugs)   No current facility-administered medications for this visit. (Ophthalmic Drugs)   Current Outpatient Medications (Other)  Medication Sig   AMBULATORY NON FORMULARY MEDICATION Bayer test strips and lancets. Use to check blood sugar twice a day. Dx: Type Two Diabetes Mellitus   amLODipine (NORVASC) 10 MG tablet TAKE 1 TABLET(10 MG) BY MOUTH DAILY   Dapagliflozin-metFORMIN HCl ER (XIGDUO XR) 08-999 MG TB24 TAKE ONE TABLET BY MOUTH DAILY   hydrochlorothiazide (HYDRODIURIL) 25 MG tablet TAKE 1 TABLET(25 MG) BY MOUTH DAILY   lisinopril (ZESTRIL) 20 MG tablet Take 1 tablet (20 mg total) by mouth every morning.   pioglitazone (ACTOS) 45 MG tablet Take 1 tablet (45 mg total) by mouth daily.   rosuvastatin (CRESTOR) 20 MG tablet Take 1 tablet (20 mg total) by mouth daily.   tadalafil (CIALIS) 20 MG tablet Take 1 tablet  (20 mg total) by mouth every other day.   tirzepatide (MOUNJARO) 12.5 MG/0.5ML Pen Inject 12.5 mg into the skin once a week.   BD PEN NEEDLE NANO U/F 32G X 4 MM MISC USE AS DIRECTED   Blood Glucose Monitoring Suppl (ONE TOUCH ULTRA 2) w/Device KIT Use to check blood sugar twice a day. Send strips and lancets, quantity 100 refills 3.  Dx: Type Two Diabetes Mellitus   glucose blood (ONE TOUCH ULTRA TEST) test strip Use as instructed   Insulin Pen Needle (PEN NEEDLES) 31G X 8 MM MISC Use the pen needles for both the insulin and victoza pens daily   Lancet Device MISC Check blood sugar once or twice daily as needed.   predniSONE (DELTASONE) 20 MG tablet Take 2 tablets (40 mg total) by mouth daily with breakfast. (Patient not taking: Reported on 06/09/2022)   No current facility-administered medications for this visit. (Other)   REVIEW OF SYSTEMS: ROS   Positive for: Endocrine, Cardiovascular, Eyes Negative for: Constitutional, Gastrointestinal, Neurological, Skin, Genitourinary, Musculoskeletal, HENT, Respiratory, Psychiatric, Allergic/Imm, Heme/Lymph Last edited by Leonie Douglas, COA on 06/09/2022 12:55 PM.     ALLERGIES No Known Allergies  PAST MEDICAL HISTORY Past Medical History:  Diagnosis Date   Diabetes mellitus without complication ([place])    Hyperlipidemia    Hypertension    Obese    Retinal hemorrhage, both eyes  Sessile colonic polyp 02/04/2019   Colonoscopy February 2020.   History reviewed. No pertinent surgical history.  FAMILY HISTORY Family History  Problem Relation Age of Onset   Arthritis Mother    Hyperlipidemia Mother    Hypertension Mother    Cancer Father 22       Prostate (Metastatic) Lung, Esophagus, Stomach   Alcohol abuse Father    Kidney disease Brother    Hypertension Brother    Cancer Paternal Aunt        Breast Cancer   Alzheimer's disease Maternal Grandmother    Heart disease Paternal Grandmother    Diabetes Brother    Alcohol abuse Maternal  Uncle    SOCIAL HISTORY Social History   Tobacco Use   Smoking status: Never   Smokeless tobacco: Never  Vaping Use   Vaping Use: Never used  Substance Use Topics   Alcohol use: Yes    Comment: Rarely   Drug use: No       OPHTHALMIC EXAM: Base Eye Exam     Visual Acuity (Snellen - Linear)       Right Left   Dist cc 20/25 20/25 +2   Dist ph cc NI 20/20 -2    Correction: Glasses         Tonometry (Tonopen, 1:01 PM)       Right Left   Pressure 13 13         Pupils       Dark Light Shape React APD   Right 3 2 Round Brisk None   Left 3 2 Round Brisk None         Visual Fields (Counting fingers)       Left Right    Full Full         Neuro/Psych     Oriented x3: Yes   Mood/Affect: Normal         Dilation     Both eyes: 1.0% Mydriacyl, 2.5% Phenylephrine @ 1:01 PM           Slit Lamp and Fundus Exam     Slit Lamp Exam       Right Left   Lids/Lashes Dermatochalasis - upper lid Dermatochalasis - upper lid   Conjunctiva/Sclera nasal pingeucula nasal and temporal pinguecula   Cornea trace PEE trace PEE   Anterior Chamber Deep and quiet Deep and quiet   Iris Round and dilated, No NVI Round and dilated, No NVI   Lens 2+ Nuclear sclerosis, 2+ Cortical cataract 2+ Nuclear sclerosis, 2+ Cortical cataract   Anterior Vitreous Mild Vitreous syneresis Mild Vitreous syneresis         Fundus Exam       Right Left   Disc Pink and Sharp, PPA, Compact mild tilt, Pink and Sharp, PPA   C/D Ratio 0.2 0.2   Macula good foveal reflex, trace MA, no exudates, +perifoveal cystic changes -- increased, no great targets for focal laser Good foveal reflex, Microaneurysms -- improved, trace Cystic changes temporal macula - slightly increased   Vessels ?NV temporal periphery, attenuated, Tortuous mild attenuation, mild Copper wiring, trace focal fibrosis along distal IT arcade, ?NV temporal periphery, Tortuous   Periphery Attached, 360 PRP with good laser fill  in, pre-retinal heme temporal periphery improved, macroaneurysms - improving, focal dot heme inferior to disc Attached, 360 PRP with good fill in, scattered IRH/DBH - improved           Refraction     Wearing Rx  Sphere Cylinder Axis   Right -2.50 +1.00 138   Left -2.25 +1.00 173           IMAGING AND PROCEDURES  Imaging and Procedures for 06/09/2022  OCT, Retina - OU - Both Eyes       Right Eye Quality was good. Central Foveal Thickness: 389. Progression has worsened. Findings include no SRF, abnormal foveal contour, intraretinal fluid, vitreomacular adhesion (interval increase in IRF greatest nasal fovea).   Left Eye Quality was good. Central Foveal Thickness: 359. Progression has worsened. Findings include normal foveal contour, no IRF, no SRF, vitreomacular adhesion (Mild interval increase in IRF / cystic changes temporal mac ).   Notes *Images captured and stored on drive  Diagnosis / Impression:  +DME OU OD: interval increase in IRF greatest nasal fovea OS: Mild interval increase in IRF / cystic changes temporal mac  Clinical management:  See below  Abbreviations: NFP - Normal foveal profile. CME - cystoid macular edema. PED - pigment epithelial detachment. IRF - intraretinal fluid. SRF - subretinal fluid. EZ - ellipsoid zone. ERM - epiretinal membrane. ORA - outer retinal atrophy. ORT - outer retinal tubulation. SRHM - subretinal hyper-reflective material. IRHM - intraretinal hyper-reflective material      Intravitreal Injection, Pharmacologic Agent - OD - Right Eye       Time Out 06/09/2022. 1:28 PM. Confirmed correct patient, procedure, site, and patient consented.   Anesthesia Topical anesthesia was used. Anesthetic medications included Lidocaine 2%, Proparacaine 0.5%.   Procedure Preparation included 5% betadine to ocular surface, eyelid speculum. A supplied (32g) needle was used.   Injection: 1.25 mg Bevacizumab 1.3m/0.05ml   Route:  Intravitreal, Site: Right Eye   NDC: 50242-060-01, Lot:: [id number] Expiration date: 07/31/2022   Post-op Post injection exam found visual acuity of at least counting fingers. The patient tolerated the procedure well. There were no complications. The patient received written and verbal post procedure care education. Post injection medications were not given.      Intravitreal Injection, Pharmacologic Agent - OS - Left Eye       Time Out 06/09/2022. 1:28 PM. Confirmed correct patient, procedure, site, and patient consented.   Anesthesia Topical anesthesia was used. Anesthetic medications included Lidocaine 2%, Proparacaine 0.5%.   Procedure Preparation included 5% betadine to ocular surface, eyelid speculum. A (32 g) needle was used.   Injection: 1.25 mg Bevacizumab 1.217m0.05ml   Route: Intravitreal, Site: Left Eye   NDC: [id number]: : [id number] date: 08/15/2022   Post-op Post injection exam found visual acuity of at least counting fingers. The patient tolerated the procedure well. There were no complications. The patient received written and verbal post procedure care education. Post injection medications were not given.            ASSESSMENT/PLAN:   ICD-10-CM   1. Proliferative diabetic retinopathy of both eyes with macular edema associated with type 2 diabetes mellitus (HCC)  E11.3513 OCT, Retina - OU - Both Eyes    Intravitreal Injection, Pharmacologic Agent - OD - Right Eye    Intravitreal Injection, Pharmacologic Agent - OS - Left Eye    Bevacizumab (AVASTIN) SOLN 1.25 mg    Bevacizumab (AVASTIN) SOLN 1.25 mg    2. Essential hypertension  I10     3. Hypertensive retinopathy of both eyes  H35.033     4. Combined forms of age-related cataract of both eyes  H25.813      1. 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, OU (OD > OS)  -  pt with history of PRP laser OU in [place] ~10+ yrs ago -- pt can't remember provider - exam shows scattered MA/IRH and  360 PRP, room for fill in OU - FA (9.19.22) shows late-leaking MA, vascular nonperfusion, +NV OU -- will need PRP fill in OU - s/p PRP OD (09.29.22) - s/p PRP OS (11.14.22) - s/p IVA OD #1 (09.19.22) - s/p IVA OS #1 (09.23.22) - s/p IVA OU #2 (10.21.22), #3 (12.23.22), #4 (01.06.23), #5 (02.03.23), #6 (03.03.23), #7 (04.07.23), #8 (05.19.23) - BCVA OD: 20/25 - decreased, 20/20 OS - stable **interval worsening of IRF at 8 weeks noted on 07.14.23 exam** - OCT shows OD: interval increase in IRF greatest nasal fovea; OS: Mild interval increase in IRF / cystic changes temporal mac at 8 weeks - recommend IVA OU #9 today, 07.14.23 for DME with f/u back to 7 weeks - pt wishes to proceed  - RBA of procedure discussed, questions answered - informed consent obtained and signed - see procedure notes - f/u 7 weeks DFE/OCT, possible injection(s)  2,3. Hypertensive retinopathy OU - discussed importance of tight BP control - monitor   4. Mixed Cataract OU - The symptoms of cataract, surgical options, and treatments and risks were discussed with patient. - discussed diagnosis and progression - not yet visually significant - monitor for now  Ophthalmic Meds Ordered this visit:  Meds ordered this encounter  Medications   Bevacizumab (AVASTIN) SOLN 1.25 mg   Bevacizumab (AVASTIN) SOLN 1.25 mg     Return in about 7 weeks (around 07/28/2022) for f/u PRD OU, DFE, OCT.  There are no Patient Instructions on file for this visit.   Explained the diagnoses, plan, and follow up with the patient and they expressed understanding.  Patient expressed understanding of the importance of proper follow up care.   This document serves as a record of services personally performed by Gardiner Sleeper, MD, PhD. It was created on their behalf by Leonie Douglas, an ophthalmic technician. The creation of this record is the provider's dictation and/or activities during the visit.    Electronically signed by: Leonie Douglas  COA, 06/09/22  1:47 PM  This document serves as a record of services personally performed by Gardiner Sleeper, MD, PhD. It was created on their behalf by San Jetty. Owens Shark, OA an ophthalmic technician. The creation of this record is the provider's dictation and/or activities during the visit.    Electronically signed by: San Jetty. Owens Shark, [place] 07.14.2023 1:47 PM  Gardiner Sleeper, M.D., Ph.D. Diseases & Surgery of the Retina and Vitreous [facility]  I have reviewed the above documentation for accuracy and completeness, and I agree with the above. Gardiner Sleeper, M.D., Ph.D. 06/09/22 1:48 PM   Abbreviations: M myopia (nearsighted); A astigmatism; H hyperopia (farsighted); P presbyopia; Mrx spectacle prescription;  CTL contact lenses; OD right eye; OS left eye; OU both eyes  XT exotropia; ET esotropia; PEK punctate epithelial keratitis; PEE punctate epithelial erosions; DES dry eye syndrome; MGD meibomian gland dysfunction; ATs artificial tears; PFAT's preservative free artificial tears; [place] nuclear sclerotic cataract; PSC posterior subcapsular cataract; ERM epi-retinal membrane; PVD posterior vitreous detachment; RD retinal detachment; DM diabetes mellitus; DR diabetic retinopathy; NPDR non-proliferative diabetic retinopathy; PDR proliferative diabetic retinopathy; CSME clinically significant macular edema; DME diabetic macular edema; dbh dot blot hemorrhages; CWS cotton wool spot; POAG primary open angle glaucoma; C/D cup-to-disc ratio; HVF humphrey visual field; GVF goldmann visual field; OCT optical coherence tomography; IOP  intraocular pressure; BRVO Branch retinal vein occlusion; CRVO central retinal vein occlusion; CRAO central retinal artery occlusion; BRAO branch retinal artery occlusion; RT retinal tear; SB scleral buckle; PPV pars plana vitrectomy; VH Vitreous hemorrhage; PRP panretinal laser photocoagulation; IVK intravitreal kenalog; VMT vitreomacular traction; MH Macular  hole;  NVD neovascularization of the disc; NVE neovascularization elsewhere; AREDS age related eye disease study; ARMD age related macular degeneration; POAG primary open angle glaucoma; EBMD epithelial/anterior basement membrane dystrophy; ACIOL anterior chamber intraocular lens; IOL intraocular lens; PCIOL posterior chamber intraocular lens; Phaco/IOL phacoemulsification with intraocular lens placement; [place] photorefractive keratectomy; LASIK laser assisted in situ keratomileusis; HTN hypertension; DM diabetes mellitus; COPD chronic obstructive pulmonary disease

## 2022-06-09 ENCOUNTER — Encounter (INDEPENDENT_AMBULATORY_CARE_PROVIDER_SITE_OTHER): Payer: Self-pay | Admitting: Ophthalmology

## 2022-06-09 ENCOUNTER — Ambulatory Visit (INDEPENDENT_AMBULATORY_CARE_PROVIDER_SITE_OTHER): Payer: No Typology Code available for payment source | Admitting: Ophthalmology

## 2022-06-09 DIAGNOSIS — H25813 Combined forms of age-related cataract, bilateral: Secondary | ICD-10-CM

## 2022-06-09 DIAGNOSIS — I10 Essential (primary) hypertension: Secondary | ICD-10-CM

## 2022-06-09 DIAGNOSIS — E113513 Type 2 diabetes mellitus with proliferative diabetic retinopathy with macular edema, bilateral: Secondary | ICD-10-CM

## 2022-06-09 DIAGNOSIS — H35033 Hypertensive retinopathy, bilateral: Secondary | ICD-10-CM

## 2022-06-09 MED ORDER — BEVACIZUMAB CHEMO INJECTION 1.25MG/0.05ML SYRINGE FOR KALEIDOSCOPE
1.2500 mg | INTRAVITREAL | Status: AC | PRN
  Administered 2022-06-09: 1.25 mg via INTRAVITREAL

## 2022-07-04 ENCOUNTER — Other Ambulatory Visit: Payer: Self-pay | Admitting: Family Medicine

## 2022-07-04 DIAGNOSIS — I10 Essential (primary) hypertension: Secondary | ICD-10-CM

## 2022-07-24 NOTE — Progress Notes (Signed)
[facility] Note  07/28/2022     CHIEF COMPLAINT Patient presents for Retina Follow Up   HISTORY OF PRESENT ILLNESS: Edward Riley is a 56 y.o. male who presents to the clinic today for:   HPI     Retina Follow Up   Patient presents with  Diabetic Retinopathy (IVA OU 07.14.23).  In both eyes.  This started years ago.  Duration of 7 weeks.  Since onset it is stable.  I, the attending physician,  performed the HPI with the patient and updated documentation appropriately.        Comments   Patient feels that the vision is the same. He has been seeing floaters in the right since the last shot. His blood sugar was 130 and his A1C 7.0      Last edited by Bernarda Caffey, MD on 07/28/2022 12:32 PM.     Pt states vision is stable  Referring physician: Luetta Nutting, DO [address] [place],  [place] [address]  HISTORICAL INFORMATION:   Selected notes from the MEDICAL RECORD NUMBER Referred by Dr. Luetta Nutting for diabetic eye exam LEE: [facility] Ocular Hx- PMH-    CURRENT MEDICATIONS: No current outpatient medications on file. (Ophthalmic Drugs)   No current facility-administered medications for this visit. (Ophthalmic Drugs)   Current Outpatient Medications (Other)  Medication Sig   AMBULATORY NON FORMULARY MEDICATION Bayer test strips and lancets. Use to check blood sugar twice a day. Dx: Type Two Diabetes Mellitus   amLODipine (NORVASC) 10 MG tablet TAKE 1 TABLET(10 MG) BY MOUTH DAILY   BD PEN NEEDLE NANO U/F 32G X 4 MM MISC USE AS DIRECTED   Blood Glucose Monitoring Suppl (ONE TOUCH ULTRA 2) w/Device KIT Use to check blood sugar twice a day. Send strips and lancets, quantity 100 refills 3.  Dx: Type Two Diabetes Mellitus   Dapagliflozin-metFORMIN HCl ER (XIGDUO XR) 08-999 MG TB24 TAKE ONE TABLET BY MOUTH DAILY   glucose blood (ONE TOUCH ULTRA TEST) test strip Use as instructed   hydrochlorothiazide  (HYDRODIURIL) 25 MG tablet TAKE 1 TABLET(25 MG) BY MOUTH DAILY   Insulin Pen Needle (PEN NEEDLES) 31G X 8 MM MISC Use the pen needles for both the insulin and victoza pens daily   Lancet Device MISC Check blood sugar once or twice daily as needed.   lisinopril (ZESTRIL) 20 MG tablet TAKE ONE TABLET BY MOUTH EVERY MORNING   pioglitazone (ACTOS) 45 MG tablet Take 1 tablet (45 mg total) by mouth daily.   predniSONE (DELTASONE) 20 MG tablet Take 2 tablets (40 mg total) by mouth daily with breakfast. (Patient not taking: Reported on 06/09/2022)   rosuvastatin (CRESTOR) 20 MG tablet Take 1 tablet (20 mg total) by mouth daily.   tadalafil (CIALIS) 20 MG tablet Take 1 tablet (20 mg total) by mouth every other day.   tirzepatide (MOUNJARO) 12.5 MG/0.5ML Pen Inject 12.5 mg into the skin once a week.   No current facility-administered medications for this visit. (Other)   REVIEW OF SYSTEMS: ROS   Positive for: Endocrine, Cardiovascular, Eyes Negative for: Constitutional, Gastrointestinal, Neurological, Skin, Genitourinary, Musculoskeletal, HENT, Respiratory, Psychiatric, Allergic/Imm, Heme/Lymph Last edited by Annie Paras, COT on 07/28/2022  9:42 AM.     ALLERGIES No Known Allergies  PAST MEDICAL HISTORY Past Medical History:  Diagnosis Date   Diabetes mellitus without complication ([place])    Hyperlipidemia    Hypertension    Obese  Retinal hemorrhage, both eyes    Sessile colonic polyp 02/04/2019   Colonoscopy February 2020.   History reviewed. No pertinent surgical history.  FAMILY HISTORY Family History  Problem Relation Age of Onset   Arthritis Mother    Hyperlipidemia Mother    Hypertension Mother    Cancer Father 62       Prostate (Metastatic) Lung, Esophagus, Stomach   Alcohol abuse Father    Kidney disease Brother    Hypertension Brother    Cancer Paternal Aunt        Breast Cancer   Alzheimer's disease Maternal Grandmother    Heart disease Paternal Grandmother     Diabetes Brother    Alcohol abuse Maternal Uncle    SOCIAL HISTORY Social History   Tobacco Use   Smoking status: Never   Smokeless tobacco: Never  Vaping Use   Vaping Use: Never used  Substance Use Topics   Alcohol use: Yes    Comment: Rarely   Drug use: No       OPHTHALMIC EXAM: Base Eye Exam     Visual Acuity (Snellen - Linear)       Right Left   Dist cc 20/25 20/20    Correction: Glasses         Tonometry (Tonopen, 9:47 AM)       Right Left   Pressure 16 17         Pupils       Dark Light Shape React APD   Right 3 2 Round Brisk None   Left 3 2 Round Brisk None         Visual Fields       Left Right    Full Full         Extraocular Movement       Right Left    Full, Ortho Full, Ortho         Neuro/Psych     Oriented x3: Yes   Mood/Affect: Normal         Dilation     Both eyes: 1.0% Mydriacyl, 2.5% Phenylephrine @ 9:44 AM           Slit Lamp and Fundus Exam     Slit Lamp Exam       Right Left   Lids/Lashes Dermatochalasis - upper lid Dermatochalasis - upper lid   Conjunctiva/Sclera nasal pingeucula nasal and temporal pinguecula   Cornea trace PEE trace PEE   Anterior Chamber Deep and quiet Deep and quiet   Iris Round and dilated, No NVI Round and dilated, No NVI   Lens 2+ Nuclear sclerosis, 2+ Cortical cataract 2+ Nuclear sclerosis, 2+ Cortical cataract   Anterior Vitreous Mild Vitreous syneresis Mild Vitreous syneresis         Fundus Exam       Right Left   Disc Pink and Sharp, PPA, Compact mild tilt, Pink and Sharp, PPA   C/D Ratio 0.2 0.2   Macula good foveal reflex, trace MA, no exudates, +perifoveal cystic changes -- slightly improved, no great targets for focal laser Good foveal reflex, Microaneurysms -- improved, trace Cystic changes temporal macula - stable   Vessels ?NV temporal periphery, attenuated, Tortuous mild attenuation, mild Copper wiring, trace focal fibrosis along distal IT arcade, ?NV temporal  periphery, Tortuous   Periphery Attached, 360 PRP with good laser fill in, pre-retinal heme temporal periphery improved, scattered macroaneurysms / DBH- greatest posteriorly Attached, 360 PRP with good fill in, scattered IRH/DBH - improved  Refraction     Wearing Rx       Sphere Cylinder Axis   Right -2.50 +1.00 138   Left -2.25 +1.00 173           IMAGING AND PROCEDURES  Imaging and Procedures for 07/28/2022  OCT, Retina - OU - Both Eyes       Right Eye Quality was good. Central Foveal Thickness: 377. Progression has improved. Findings include no SRF, abnormal foveal contour, intraretinal fluid, vitreomacular adhesion (interval improvement in IRF greatest nasal fovea, partial PVD).   Left Eye Quality was good. Central Foveal Thickness: 358. Progression has been stable. Findings include normal foveal contour, no IRF, no SRF, vitreomacular adhesion (Persistent IRF / cystic changes temporal mac -- very mild ).   Notes *Images captured and stored on drive  Diagnosis / Impression:  +DME OU OD: interval improvement in IRF greatest nasal fovea OS: Persistent IRF / cystic changes temporal mac -- very mild   Clinical management:  See below  Abbreviations: NFP - Normal foveal profile. CME - cystoid macular edema. PED - pigment epithelial detachment. IRF - intraretinal fluid. SRF - subretinal fluid. EZ - ellipsoid zone. ERM - epiretinal membrane. ORA - outer retinal atrophy. ORT - outer retinal tubulation. SRHM - subretinal hyper-reflective material. IRHM - intraretinal hyper-reflective material      Intravitreal Injection, Pharmacologic Agent - OD - Right Eye       Time Out 07/28/2022. 10:52 AM. Confirmed correct patient, procedure, site, and patient consented.   Anesthesia Topical anesthesia was used. Anesthetic medications included Lidocaine 2%, Proparacaine 0.5%.   Procedure Preparation included 5% betadine to ocular surface, eyelid speculum. A supplied (32g)  needle was used.   Injection: 1.25 mg Bevacizumab 1.37m/0.05ml   Route: Intravitreal, Site: Right Eye   NDC:: 08657-846-96 Lot: [id number] , Expiration date: 08/29/2022   Post-op Post injection exam found visual acuity of at least counting fingers. The patient tolerated the procedure well. There were no complications. The patient received written and verbal post procedure care education. Post injection medications were not given.      Intravitreal Injection, Pharmacologic Agent - OS - Left Eye       Time Out 07/28/2022. 10:53 AM. Confirmed correct patient, procedure, site, and patient consented.   Anesthesia Topical anesthesia was used. Anesthetic medications included Lidocaine 2%, Proparacaine 0.5%.   Procedure Preparation included 5% betadine to ocular surface, eyelid speculum. A (32 g) needle was used.   Injection: 1.25 mg Bevacizumab 1.268m0.05ml   Route: Intravitreal, Site: Left Eye   NDC: : 29528-413-24Lot: : [id number] date: 09/05/2022   Post-op Post injection exam found visual acuity of at least counting fingers. The patient tolerated the procedure well. There were no complications. The patient received written and verbal post procedure care education. Post injection medications were not given.            ASSESSMENT/PLAN:   ICD-10-CM   1. Proliferative diabetic retinopathy of both eyes with macular edema associated with type 2 diabetes mellitus (HCC)  E11.3513 OCT, Retina - OU - Both Eyes    Intravitreal Injection, Pharmacologic Agent - OD - Right Eye    Intravitreal Injection, Pharmacologic Agent - OS - Left Eye    Bevacizumab (AVASTIN) SOLN 1.25 mg    Bevacizumab (AVASTIN) SOLN 1.25 mg    2. Essential hypertension  I10     3. Hypertensive retinopathy of both eyes  H35.033     4. Combined forms of age-related cataract of both eyes  H25.813  1. 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, OU (OD > OS)  - pt with history of PRP laser OU in  [place] ~10+ yrs ago -- pt can't remember provider - exam shows scattered MA/IRH and 360 PRP, room for fill in OU - FA (9.19.22) shows late-leaking MA, vascular nonperfusion, +NV OU -- will need PRP fill in OU - s/p PRP OD (09.29.22) - s/p PRP OS (11.14.22) - s/p IVA OD #1 (09.19.22) - s/p IVA OS #1 (09.23.22) - s/p IVA OU #2 (10.21.22), #3 (12.23.22), #4 (01.06.23), #5 (02.03.23), #6 (03.03.23), #7 (04.07.23), #8 (05.19.23), #9 (07.14.23) **interval worsening of IRF at 8 weeks noted on 07.14.23 exam** - BCVA OD: 20/25; 20/20 OS - stable - OCT shows OD: interval improvement in IRF greatest nasal fovea; OS: Persistent IRF / cystic changes temporal mac -- very mild  - recommend IVA OU #10 today, 09.01.23 for DME with f/u 7 weeks - pt wishes to proceed  - RBA of procedure discussed, questions answered - informed consent obtained and signed - see procedure notes - f/u 7 weeks DFE/OCT, possible injection(s)  2,3. Hypertensive retinopathy OU - discussed importance of tight BP control - monitor   4. Mixed Cataract OU - The symptoms of cataract, surgical options, and treatments and risks were discussed with patient. - discussed diagnosis and progression - not yet visually significant - monitor for now  Ophthalmic Meds Ordered this visit:  Meds ordered this encounter  Medications   Bevacizumab (AVASTIN) SOLN 1.25 mg   Bevacizumab (AVASTIN) SOLN 1.25 mg     Return in about 7 weeks (around 09/15/2022) for f/u PDR OU, DFE, OCT.  There are no Patient Instructions on file for this visit.   Explained the diagnoses, plan, and follow up with the patient and they expressed understanding.  Patient expressed understanding of the importance of proper follow up care.   This document serves as a record of services personally performed by Gardiner Sleeper, MD, PhD. It was created on their behalf by San Jetty. Owens Shark, OA an ophthalmic technician. The creation of this record is the provider's  dictation and/or activities during the visit.    Electronically signed by: San Jetty. Marguerita Merles 08.28.2023 12:37 PM  Gardiner Sleeper, M.D., Ph.D. Diseases & Surgery of the Retina and Vitreous [facility]  I have reviewed the above documentation for accuracy and completeness, and I agree with the above. Gardiner Sleeper, M.D., Ph.D. 07/28/22 12:37 PM  Abbreviations: M myopia (nearsighted); A astigmatism; H hyperopia (farsighted); P presbyopia; Mrx spectacle prescription;  CTL contact lenses; OD right eye; OS left eye; OU both eyes  XT exotropia; ET esotropia; PEK punctate epithelial keratitis; PEE punctate epithelial erosions; DES dry eye syndrome; MGD meibomian gland dysfunction; ATs artificial tears; PFAT's preservative free artificial tears; [place] nuclear sclerotic cataract; PSC posterior subcapsular cataract; ERM epi-retinal membrane; PVD posterior vitreous detachment; RD retinal detachment; DM diabetes mellitus; DR diabetic retinopathy; NPDR non-proliferative diabetic retinopathy; PDR proliferative diabetic retinopathy; CSME clinically significant macular edema; DME diabetic macular edema; dbh dot blot hemorrhages; CWS cotton wool spot; POAG primary open angle glaucoma; C/D cup-to-disc ratio; HVF humphrey visual field; GVF goldmann visual field; OCT optical coherence tomography; IOP intraocular pressure; BRVO Branch retinal vein occlusion; CRVO central retinal vein occlusion; CRAO central retinal artery occlusion; BRAO branch retinal artery occlusion; RT retinal tear; SB scleral buckle; PPV pars plana vitrectomy; VH Vitreous hemorrhage; PRP panretinal laser photocoagulation; IVK intravitreal kenalog; VMT vitreomacular traction; MH Macular hole;  NVD neovascularization of the disc; NVE neovascularization elsewhere; AREDS age related eye disease study; ARMD age related macular degeneration; POAG primary open angle glaucoma; EBMD epithelial/anterior basement membrane dystrophy; ACIOL  anterior chamber intraocular lens; IOL intraocular lens; PCIOL posterior chamber intraocular lens; Phaco/IOL phacoemulsification with intraocular lens placement; [place] photorefractive keratectomy; LASIK laser assisted in situ keratomileusis; HTN hypertension; DM diabetes mellitus; COPD chronic obstructive pulmonary disease

## 2022-07-28 ENCOUNTER — Ambulatory Visit (INDEPENDENT_AMBULATORY_CARE_PROVIDER_SITE_OTHER): Payer: No Typology Code available for payment source | Admitting: Ophthalmology

## 2022-07-28 ENCOUNTER — Encounter (INDEPENDENT_AMBULATORY_CARE_PROVIDER_SITE_OTHER): Payer: Self-pay | Admitting: Ophthalmology

## 2022-07-28 DIAGNOSIS — I10 Essential (primary) hypertension: Secondary | ICD-10-CM | POA: Diagnosis not present

## 2022-07-28 DIAGNOSIS — H25813 Combined forms of age-related cataract, bilateral: Secondary | ICD-10-CM | POA: Diagnosis not present

## 2022-07-28 DIAGNOSIS — H35033 Hypertensive retinopathy, bilateral: Secondary | ICD-10-CM | POA: Diagnosis not present

## 2022-07-28 DIAGNOSIS — E113513 Type 2 diabetes mellitus with proliferative diabetic retinopathy with macular edema, bilateral: Secondary | ICD-10-CM | POA: Diagnosis not present

## 2022-07-28 MED ORDER — BEVACIZUMAB CHEMO INJECTION 1.25MG/0.05ML SYRINGE FOR KALEIDOSCOPE
1.2500 mg | INTRAVITREAL | Status: AC | PRN
  Administered 2022-07-28: 1.25 mg via INTRAVITREAL

## 2022-09-03 ENCOUNTER — Other Ambulatory Visit: Payer: Self-pay | Admitting: Family Medicine

## 2022-09-03 DIAGNOSIS — E119 Type 2 diabetes mellitus without complications: Secondary | ICD-10-CM

## 2022-09-04 ENCOUNTER — Other Ambulatory Visit: Payer: Self-pay | Admitting: Family Medicine

## 2022-09-06 NOTE — Progress Notes (Signed)
[facility] Note  09/15/2022     CHIEF COMPLAINT Patient presents for Retina Follow Up   HISTORY OF PRESENT ILLNESS: Edward Riley is a 56 y.o. male who presents to the clinic today for:   HPI     Retina Follow Up   Patient presents with  Diabetic Retinopathy.  In both eyes.  This started 7 weeks ago.  I, the attending physician,  performed the HPI with the patient and updated documentation appropriately.        Comments   Patient here for 7 weeks retina follow up for PDR OU. Patient states vision doing the same. No difference. No eye pain. Just a few floaters always.       Last edited by Bernarda Caffey, MD on 09/15/2022 12:07 PM.    Pt states vision is stable  Referring physician: Luetta Nutting, DO [address] [facility] [address] [place],  [place] [address]  HISTORICAL INFORMATION:   Selected notes from the MEDICAL RECORD NUMBER Referred by Dr. Luetta Nutting for diabetic eye exam LEE: [facility] Ocular Hx- PMH-    CURRENT MEDICATIONS: No current outpatient medications on file. (Ophthalmic Drugs)   No current facility-administered medications for this visit. (Ophthalmic Drugs)   Current Outpatient Medications (Other)  Medication Sig   AMBULATORY NON FORMULARY MEDICATION Bayer test strips and lancets. Use to check blood sugar twice a day. Dx: Type Two Diabetes Mellitus   amLODipine (NORVASC) 10 MG tablet TAKE 1 TABLET(10 MG) BY MOUTH DAILY   BD PEN NEEDLE NANO U/F 32G X 4 MM MISC USE AS DIRECTED   Blood Glucose Monitoring Suppl (ONE TOUCH ULTRA 2) w/Device KIT Use to check blood sugar twice a day. Send strips and lancets, quantity 100 refills 3.  Dx: Type Two Diabetes Mellitus   Dapagliflozin-metFORMIN HCl ER (XIGDUO XR) 08-999 MG TB24 TAKE ONE TABLET BY MOUTH DAILY   glucose blood (ONE TOUCH ULTRA TEST) test strip Use as instructed   hydrochlorothiazide (HYDRODIURIL) 25 MG tablet TAKE ONE TABLET BY MOUTH DAILY    Insulin Pen Needle (PEN NEEDLES) 31G X 8 MM MISC Use the pen needles for both the insulin and victoza pens daily   Lancet Device MISC Check blood sugar once or twice daily as needed.   lisinopril (ZESTRIL) 20 MG tablet TAKE ONE TABLET BY MOUTH EVERY MORNING   pioglitazone (ACTOS) 45 MG tablet TAKE ONE TABLET BY MOUTH DAILY   rosuvastatin (CRESTOR) 20 MG tablet TAKE ONE TABLET BY MOUTH DAILY   tadalafil (CIALIS) 20 MG tablet Take 1 tablet (20 mg total) by mouth every other day.   tirzepatide (MOUNJARO) 12.5 MG/0.5ML Pen Inject 12.5 mg into the skin once a week.   predniSONE (DELTASONE) 20 MG tablet Take 2 tablets (40 mg total) by mouth daily with breakfast. (Patient not taking: Reported on 06/09/2022)   No current facility-administered medications for this visit. (Other)   REVIEW OF SYSTEMS: ROS   Positive for: Endocrine, Cardiovascular, Eyes Negative for: Constitutional, Gastrointestinal, Neurological, Skin, Genitourinary, Musculoskeletal, HENT, Respiratory, Psychiatric, Allergic/Imm, Heme/Lymph Last edited by Theodore Demark, COA on 09/15/2022  9:51 AM.     ALLERGIES No Known Allergies  PAST MEDICAL HISTORY Past Medical History:  Diagnosis Date   Diabetes mellitus without complication ([place])    Hyperlipidemia    Hypertension    Obese    Retinal hemorrhage, both eyes    Sessile colonic polyp 02/04/2019   Colonoscopy February 2020.   History  reviewed. No pertinent surgical history.  FAMILY HISTORY Family History  Problem Relation Age of Onset   Arthritis Mother    Hyperlipidemia Mother    Hypertension Mother    Cancer Father 87       Prostate (Metastatic) Lung, Esophagus, Stomach   Alcohol abuse Father    Kidney disease Brother    Hypertension Brother    Cancer Paternal Aunt        Breast Cancer   Alzheimer's disease Maternal Grandmother    Heart disease Paternal Grandmother    Diabetes Brother    Alcohol abuse Maternal Uncle    SOCIAL HISTORY Social History    Tobacco Use   Smoking status: Never   Smokeless tobacco: Never  Vaping Use   Vaping Use: Never used  Substance Use Topics   Alcohol use: Yes    Comment: Rarely   Drug use: No       OPHTHALMIC EXAM: Base Eye Exam     Visual Acuity (Snellen - Linear)       Right Left   Dist cc 20/25 -2 20/20 -2    Correction: Glasses         Tonometry (Tonopen, 9:49 AM)       Right Left   Pressure 15 16         Pupils       Dark Light Shape React APD   Right 3 2 Round Brisk None   Left 3 2 Round Brisk None         Visual Fields (Counting fingers)       Left Right    Full Full         Extraocular Movement       Right Left    Full, Ortho Full, Ortho         Neuro/Psych     Oriented x3: Yes   Mood/Affect: Normal         Dilation     Both eyes: 1.0% Mydriacyl, 2.5% Phenylephrine @ 9:49 AM           Slit Lamp and Fundus Exam     Slit Lamp Exam       Right Left   Lids/Lashes Dermatochalasis - upper lid Dermatochalasis - upper lid   Conjunctiva/Sclera nasal pingeucula nasal and temporal pinguecula   Cornea trace PEE trace PEE   Anterior Chamber Deep and quiet Deep and quiet   Iris Round and dilated, No NVI Round and dilated, No NVI   Lens 2+ Nuclear sclerosis, 2+ Cortical cataract 2+ Nuclear sclerosis, 2+ Cortical cataract   Anterior Vitreous Mild Vitreous syneresis Mild Vitreous syneresis         Fundus Exam       Right Left   Disc Pink and Sharp, PPA, Compact mild tilt, Pink and Sharp, PPA   C/D Ratio 0.2 0.2   Macula good foveal reflex, trace MA, no exudates, +perifoveal cystic changes -- slightly improved, no great targets for focal laser Good foveal reflex, Microaneurysms -- slightly improved, trace Cystic changes temporal macula - stable   Vessels ?NV temporal periphery-- regressing, attenuated, Tortuous mild attenuation, mild Copper wiring, trace focal fibrosis along distal IT arcade, ?NV temporal periphery, Tortuous   Periphery  Attached, 360 PRP with good laser fill in, pre-retinal heme temporal periphery improved, scattered macroaneurysms / DBH- greatest posteriorly Attached, 360 PRP with good fill in, scattered IRH/DBH - improved           Refraction  Wearing Rx       Sphere Cylinder Axis   Right -2.50 +1.00 138   Left -2.25 +1.00 173           IMAGING AND PROCEDURES  Imaging and Procedures for 09/15/2022  OCT, Retina - OU - Both Eyes       Right Eye Quality was good. Central Foveal Thickness: 396. Progression has improved. Findings include no SRF, abnormal foveal contour, intraretinal fluid, vitreomacular adhesion (interval improvement in IRF temporal macula, persistent changes greatest nasal fovea, partial PVD).   Left Eye Quality was good. Central Foveal Thickness: 363. Progression has improved. Findings include normal foveal contour, no IRF, no SRF, vitreomacular adhesion (Persistent IRF / cystic changes temporal mac -- slightly improved).   Notes *Images captured and stored on drive  Diagnosis / Impression:  +DME OU OD: interval improvement in IRF temporal macula, persistent changes greatest nasal fovea, partial PVD OS: Persistent IRF / cystic changes temporal mac -- slightly improved  Clinical management:  See below  Abbreviations: NFP - Normal foveal profile. CME - cystoid macular edema. PED - pigment epithelial detachment. IRF - intraretinal fluid. SRF - subretinal fluid. EZ - ellipsoid zone. ERM - epiretinal membrane. ORA - outer retinal atrophy. ORT - outer retinal tubulation. SRHM - subretinal hyper-reflective material. IRHM - intraretinal hyper-reflective material      Intravitreal Injection, Pharmacologic Agent - OD - Right Eye       Time Out 09/15/2022. 10:55 AM. Confirmed correct patient, procedure, site, and patient consented.   Anesthesia Topical anesthesia was used. Anesthetic medications included Lidocaine 2%, Proparacaine 0.5%.   Procedure Preparation  included 5% betadine to ocular surface, eyelid speculum. A supplied (32g) needle was used.   Injection: 1.25 mg Bevacizumab 1.28m/0.05ml   Route: Intravitreal, Site: Right Eye   NDC:: 40086-761-95 Lot: [id number] , Expiration date: 10/08/2022   Post-op Post injection exam found visual acuity of at least counting fingers. The patient tolerated the procedure well. There were no complications. The patient received written and verbal post procedure care education. Post injection medications were not given.      Intravitreal Injection, Pharmacologic Agent - OS - Left Eye       Time Out 09/15/2022. 10:55 AM. Confirmed correct patient, procedure, site, and patient consented.   Anesthesia Topical anesthesia was used. Anesthetic medications included Lidocaine 2%, Proparacaine 0.5%.   Procedure Preparation included 5% betadine to ocular surface, eyelid speculum. A (32 g) needle was used.   Injection: 1.25 mg Bevacizumab 1.264m0.05ml   Route: Intravitreal, Site: Left Eye   NDC: : 09326-712-45Lot: : [id number] date: 10/11/2022   Post-op Post injection exam found visual acuity of at least counting fingers. The patient tolerated the procedure well. There were no complications. The patient received written and verbal post procedure care education. Post injection medications were not given.             ASSESSMENT/PLAN:   ICD-10-CM   1. Proliferative diabetic retinopathy of both eyes with macular edema associated with type 2 diabetes mellitus (HCC)  E11.3513 OCT, Retina - OU - Both Eyes    Intravitreal Injection, Pharmacologic Agent - OD - Right Eye    Intravitreal Injection, Pharmacologic Agent - OS - Left Eye    Bevacizumab (AVASTIN) SOLN 1.25 mg    Bevacizumab (AVASTIN) SOLN 1.25 mg    2. Essential hypertension  I10     3. Hypertensive retinopathy of both eyes  H35.033     4. Combined forms of age-related cataract of both  eyes  H25.813      1. Proliferative diabetic  retinopathy w/o DME, OU (OD > OS)  - last A1c 7.1 on 04.21.23  - pt with history of PRP laser OU in [place] ~10+ yrs ago -- pt can't remember provider - exam shows scattered MA/IRH and 360 PRP, room for fill in OU - FA (9.19.22) shows late-leaking MA, vascular nonperfusion, +NV OU -- will need PRP fill in OU - s/p PRP OD (09.29.22) - s/p PRP OS (11.14.22) - s/p IVA OD #1 (09.19.22) - s/p IVA OS #1 (09.23.22) - s/p IVA OU #2 (10.21.22), #3 (12.23.22), #4 (01.06.23), #5 (02.03.23), #6 (03.03.23), #7 (04.07.23), #8 (05.19.23), #9 (07.14.23), #10 (09.01.23) **interval worsening of IRF at 8 weeks noted on 07.14.23 exam** - BCVA OD: 20/25; 20/20 OS - stable - OCT shows OD: interval improvement in IRF greatest nasal fovea; OS: Persistent IRF / cystic changes temporal mac -- slightly improved - recommend IVA OU #11 today, 10.20.23 for DME with f/u 7 weeks - pt wishes to proceed  - RBA of procedure discussed, questions answered - informed consent obtained and signed - see procedure notes - f/u 7 weeks DFE/OCT, possible injection(s)  2,3. Hypertensive retinopathy OU - discussed importance of tight BP control - continue to monitor   4. Mixed Cataract OU - The symptoms of cataract, surgical options, and treatments and risks were discussed with patient. - discussed diagnosis and progression - not yet visually significant - monitor for now  Ophthalmic Meds Ordered this visit:  Meds ordered this encounter  Medications   Bevacizumab (AVASTIN) SOLN 1.25 mg   Bevacizumab (AVASTIN) SOLN 1.25 mg     Return in about 7 weeks (around 11/03/2022) for f/u PDR OU, DFE, OCT, Possible, IVA, OU.  There are no Patient Instructions on file for this visit.   Explained the diagnoses, plan, and follow up with the patient and they expressed understanding.  Patient expressed understanding of the importance of proper follow up care.   This document serves as a record of services personally performed by  Gardiner Sleeper, MD, PhD. It was created on their behalf by San Jetty. Owens Shark, OA an ophthalmic technician. The creation of this record is the provider's dictation and/or activities during the visit.    Electronically signed by: San Jetty. Owens Shark, [place] 10.11.2023 12:24 PM  This document serves as a record of services personally performed by Gardiner Sleeper, MD, PhD. It was created on their behalf by Renaldo Reel, [place] an ophthalmic technician. The creation of this record is the provider's dictation and/or activities during the visit.    Electronically signed by:  Renaldo Reel, COT  10.20.23 12:24 PM  Gardiner Sleeper, M.D., Ph.D. Diseases & Surgery of the Retina and Vitreous [facility]  I have reviewed the above documentation for accuracy and completeness, and I agree with the above. Gardiner Sleeper, M.D., Ph.D. 09/15/22 12:24 PM   Abbreviations: M myopia (nearsighted); A astigmatism; H hyperopia (farsighted); P presbyopia; Mrx spectacle prescription;  CTL contact lenses; OD right eye; OS left eye; OU both eyes  XT exotropia; ET esotropia; PEK punctate epithelial keratitis; PEE punctate epithelial erosions; DES dry eye syndrome; MGD meibomian gland dysfunction; ATs artificial tears; PFAT's preservative free artificial tears; [place] nuclear sclerotic cataract; PSC posterior subcapsular cataract; ERM epi-retinal membrane; PVD posterior vitreous detachment; RD retinal detachment; DM diabetes mellitus; DR diabetic retinopathy; NPDR non-proliferative diabetic retinopathy; PDR proliferative diabetic retinopathy; CSME clinically significant macular edema; DME diabetic  macular edema; dbh dot blot hemorrhages; CWS cotton wool spot; POAG primary open angle glaucoma; C/D cup-to-disc ratio; HVF humphrey visual field; GVF goldmann visual field; OCT optical coherence tomography; IOP intraocular pressure; BRVO Branch retinal vein occlusion; CRVO central retinal vein occlusion; CRAO central  retinal artery occlusion; BRAO branch retinal artery occlusion; RT retinal tear; SB scleral buckle; PPV pars plana vitrectomy; VH Vitreous hemorrhage; PRP panretinal laser photocoagulation; IVK intravitreal kenalog; VMT vitreomacular traction; MH Macular hole;  NVD neovascularization of the disc; NVE neovascularization elsewhere; AREDS age related eye disease study; ARMD age related macular degeneration; POAG primary open angle glaucoma; EBMD epithelial/anterior basement membrane dystrophy; ACIOL anterior chamber intraocular lens; IOL intraocular lens; PCIOL posterior chamber intraocular lens; Phaco/IOL phacoemulsification with intraocular lens placement; [place] photorefractive keratectomy; LASIK laser assisted in situ keratomileusis; HTN hypertension; DM diabetes mellitus; COPD chronic obstructive pulmonary disease

## 2022-09-15 ENCOUNTER — Ambulatory Visit (INDEPENDENT_AMBULATORY_CARE_PROVIDER_SITE_OTHER): Payer: No Typology Code available for payment source | Admitting: Ophthalmology

## 2022-09-15 ENCOUNTER — Encounter (INDEPENDENT_AMBULATORY_CARE_PROVIDER_SITE_OTHER): Payer: Self-pay | Admitting: Ophthalmology

## 2022-09-15 DIAGNOSIS — H35033 Hypertensive retinopathy, bilateral: Secondary | ICD-10-CM

## 2022-09-15 DIAGNOSIS — H25813 Combined forms of age-related cataract, bilateral: Secondary | ICD-10-CM

## 2022-09-15 DIAGNOSIS — I10 Essential (primary) hypertension: Secondary | ICD-10-CM | POA: Diagnosis not present

## 2022-09-15 DIAGNOSIS — E113513 Type 2 diabetes mellitus with proliferative diabetic retinopathy with macular edema, bilateral: Secondary | ICD-10-CM

## 2022-09-15 LAB — HM DIABETES EYE EXAM

## 2022-09-15 MED ORDER — BEVACIZUMAB CHEMO INJECTION 1.25MG/0.05ML SYRINGE FOR KALEIDOSCOPE
1.2500 mg | INTRAVITREAL | Status: AC | PRN
  Administered 2022-09-15: 1.25 mg via INTRAVITREAL

## 2022-09-21 ENCOUNTER — Encounter: Payer: Self-pay | Admitting: Family Medicine

## 2022-09-21 ENCOUNTER — Ambulatory Visit (INDEPENDENT_AMBULATORY_CARE_PROVIDER_SITE_OTHER): Payer: No Typology Code available for payment source | Admitting: Family Medicine

## 2022-09-21 VITALS — BP 122/62 | HR 43 | Ht 68.0 in | Wt 220.0 lb

## 2022-09-21 DIAGNOSIS — Z23 Encounter for immunization: Secondary | ICD-10-CM | POA: Diagnosis not present

## 2022-09-21 DIAGNOSIS — E1121 Type 2 diabetes mellitus with diabetic nephropathy: Secondary | ICD-10-CM

## 2022-09-21 DIAGNOSIS — E782 Mixed hyperlipidemia: Secondary | ICD-10-CM | POA: Diagnosis not present

## 2022-09-21 DIAGNOSIS — N529 Male erectile dysfunction, unspecified: Secondary | ICD-10-CM

## 2022-09-21 DIAGNOSIS — I152 Hypertension secondary to endocrine disorders: Secondary | ICD-10-CM

## 2022-09-21 DIAGNOSIS — N420 Calculus of prostate: Secondary | ICD-10-CM | POA: Diagnosis not present

## 2022-09-21 DIAGNOSIS — E1159 Type 2 diabetes mellitus with other circulatory complications: Secondary | ICD-10-CM | POA: Diagnosis not present

## 2022-09-21 MED ORDER — TADALAFIL 20 MG PO TABS: 20.0000 mg | ORAL_TABLET | ORAL | 1 refills | Status: DC

## 2022-09-21 MED ORDER — TIRZEPATIDE 15 MG/0.5ML ~~LOC~~ SOAJ: 15.0000 mg | SUBCUTANEOUS | 2 refills | Status: DC

## 2022-09-21 NOTE — Assessment & Plan Note (Signed)
Blood pressure is well controlled at this time.  Recommend continuation of current medication for management of hypertension.  He will let me know if he feels like his blood pressure is getting too low if he continues to lose weight.

## 2022-09-21 NOTE — Assessment & Plan Note (Signed)
He has done well with tadalafil.  Updated prescription sent in.

## 2022-09-21 NOTE — Patient Instructions (Signed)
Increase mounjaro to 15mg  weekly.  See me again in 6 months.

## 2022-09-21 NOTE — Assessment & Plan Note (Signed)
Updating A1c today.  He has had significant weight loss with Mounjaro and is tolerating well.  Continues to have regular injections for retinopathy.

## 2022-09-21 NOTE — Assessment & Plan Note (Signed)
Tolerating Crestor well at this time.  Recommend continuation.

## 2022-09-21 NOTE — Progress Notes (Signed)
Edward Riley - 56 y.o. male MRN [id number]  Date of birth: [birth year]  Subjective Chief Complaint  Patient presents with   Diabetes   Hypertension    HPI Edward Riley is a 56 y.o. male here today for follow up visit.   He reports that he is doing well  New concerns today: None  He continues on mounjaro, actos and xigduo for management of diabetes.  He is tolerating medications pretty well.  He has made some dietary changes and weight is down an additional 20 pounds.  Activity level is unchanged.    Remains on amlodipine, lisinopril and HCTZ for management of HTN.  Tolerating well and BP is well controlled at this time.  Tolerating crestor well for management of HLD.    Requesting renewal of tadalafil.  Tolerating this well.  ROS:  A comprehensive ROS was completed and negative except as noted per HPI  No Known Allergies  Past Medical History:  Diagnosis Date   Diabetes mellitus without complication (HCC)    Hyperlipidemia    Hypertension    Obese    Retinal hemorrhage, both eyes    Sessile colonic polyp 02/04/2019   Colonoscopy February 2020.    History reviewed. No pertinent surgical history.  Social History   Socioeconomic History   Marital status: Single    Spouse name: Not on file   Number of children: Not on file   Years of education: 12   Highest education level: Not on file  Occupational History   Occupation: TRUCK DRIVER     Employer: other - Physiological scientist    Comment: [employer]  Tobacco Use   Smoking status: Never   Smokeless tobacco: Never  Vaping Use   Vaping Use: Never used  Substance and Sexual Activity   Alcohol use: Yes    Comment: Rarely   Drug use: No   Sexual activity: Yes  Other Topics Concern   Not on file  Social History Narrative   Marital Status: Single    Children: None   Pets: None    Living Situation: Lives alone   Occupation: Naval architect Research officer, trade union)     Education:  12 th Grade    Tobacco  Use/Exposure:  None    Alcohol Use:  Rarely   Drug Use:  None   Diet:  Regular   Exercise:  Very Active at work    Hobbies:  Teacher, early years/pre                Social Determinants of Corporate investment banker Strain: Not on file  Food Insecurity: Not on file  Transportation Needs: Not on file  Physical Activity: Not on file  Stress: Not on file  Social Connections: Not on file    Family History  Problem Relation Age of Onset   Arthritis Mother    Hyperlipidemia Mother    Hypertension Mother    Cancer Father 61       Prostate (Metastatic) Lung, Esophagus, Stomach   Alcohol abuse Father    Kidney disease Brother    Hypertension Brother    Cancer Paternal Aunt        Breast Cancer   Alzheimer's disease Maternal Grandmother    Heart disease Paternal Grandmother    Diabetes Brother    Alcohol abuse Maternal Uncle     Health Maintenance  Topic Date Due   Diabetic kidney evaluation - Urine ACR  06/02/2017   Diabetic kidney evaluation - GFR measurement  07/04/2022   HEMOGLOBIN A1C  09/16/2022   Zoster Vaccines- Shingrix (2 of 2) 12/22/2022 (Originally 08/29/2021)   COVID-19 Vaccine (2 - Booster for Janssen series) 12/28/2022 (Originally 05/01/2020)   Hepatitis C Screening  09/22/2023 (Originally 04/04/1986)   HIV Screening  09/22/2023 (Originally 04/05/1983)   OPHTHALMOLOGY EXAM  09/16/2023   FOOT EXAM  09/22/2023   TETANUS/TDAP  11/17/2026   COLONOSCOPY (Pts 45-72yrs Insurance coverage will need to be confirmed)  01/24/2029   INFLUENZA VACCINE  Completed   HPV VACCINES  Aged Out     ----------------------------------------------------------------------------------------------------------------------------------------------------------------------------------------------------------------- Physical Exam BP 122/62 (BP Location: Right Arm, Patient Position: Sitting, Cuff Size: Large)   Pulse (!) 43   Ht 5\' 8"  (1.727 m)   Wt 220 lb (99.8 kg)   SpO2 99%   BMI 33.45 kg/m    Physical Exam Constitutional:      Appearance: Normal appearance.  Eyes:     General: No scleral icterus. Cardiovascular:     Rate and Rhythm: Normal rate and regular rhythm.  Musculoskeletal:     Cervical back: Neck supple.  Neurological:     Mental Status: He is alert.  Psychiatric:        Mood and Affect: Mood normal.        Behavior: Behavior normal.     ------------------------------------------------------------------------------------------------------------------------------------------------------------------------------------------------------------------- Assessment and Plan  Hypertension associated with diabetes ([place]) Blood pressure is well controlled at this time.  Recommend continuation of current medication for management of hypertension.  He will let me know if he feels like his blood pressure is getting too low if he continues to lose weight.  Type 2 diabetes mellitus with diabetic nephropathy Updating A1c today.  He has had significant weight loss with Mounjaro and is tolerating well.  Continues to have regular injections for retinopathy.  Hyperlipidemia Tolerating Crestor well at this time.  Recommend continuation.  Erectile dysfunction He has done well with tadalafil.  Updated prescription sent in.   Meds ordered this encounter  Medications   tirzepatide (MOUNJARO) 15 MG/0.5ML Pen    Sig: Inject 15 mg into the skin once a week.    Dispense:  6 mL    Refill:  2   tadalafil (CIALIS) 20 MG tablet    Sig: Take 1 tablet (20 mg total) by mouth every other day.    Dispense:  90 tablet    Refill:  1    Return in about 6 months (around 03/23/2023) for HTN/T2DM.    This visit occurred during the SARS-CoV-2 public health emergency.  Safety protocols were in place, including screening questions prior to the visit, additional usage of staff PPE, and extensive cleaning of exam room while observing appropriate contact time as indicated for disinfecting  solutions.

## 2022-09-22 ENCOUNTER — Ambulatory Visit: Payer: No Typology Code available for payment source | Admitting: Family Medicine

## 2022-09-22 LAB — CBC WITH DIFFERENTIAL/PLATELET
Absolute Monocytes: 621 cells/uL (ref 200–950)
Basophils Absolute: 29 cells/uL (ref 0–200)
Basophils Relative: 0.4 %
Eosinophils Absolute: 73 cells/uL (ref 15–500)
Eosinophils Relative: 1 %
HCT: 43.9 % (ref 38.5–50.0)
Hemoglobin: 15.2 g/dL (ref 13.2–17.1)
Lymphs Abs: 1321 cells/uL (ref 850–3900)
MCH: 31.4 pg (ref 27.0–33.0)
MCHC: 34.6 g/dL (ref 32.0–36.0)
MCV: 90.7 fL (ref 80.0–100.0)
MPV: 11.5 fL (ref 7.5–12.5)
Monocytes Relative: 8.5 %
Neutro Abs: 5256 cells/uL (ref 1500–7800)
Neutrophils Relative %: 72 %
Platelets: 219 10*3/uL (ref 140–400)
RBC: 4.84 10*6/uL (ref 4.20–5.80)
RDW: 12.5 % (ref 11.0–15.0)
Total Lymphocyte: 18.1 %
WBC: 7.3 10*3/uL (ref 3.8–10.8)

## 2022-09-22 LAB — COMPLETE METABOLIC PANEL WITH GFR
AG Ratio: 2 (calc) (ref 1.0–2.5)
ALT: 14 U/L (ref 9–46)
AST: 17 U/L (ref 10–35)
Albumin: 5 g/dL (ref 3.6–5.1)
Alkaline phosphatase (APISO): 56 U/L (ref 35–144)
BUN/Creatinine Ratio: 27 (calc) — ABNORMAL HIGH (ref 6–22)
BUN: 33 mg/dL — ABNORMAL HIGH (ref 7–25)
CO2: 29 mmol/L (ref 20–32)
Calcium: 10.2 mg/dL (ref 8.6–10.3)
Chloride: 99 mmol/L (ref 98–110)
Creat: 1.21 mg/dL (ref 0.70–1.30)
Globulin: 2.5 g/dL (calc) (ref 1.9–3.7)
Glucose, Bld: 114 mg/dL — ABNORMAL HIGH (ref 65–99)
Potassium: 5.2 mmol/L (ref 3.5–5.3)
Sodium: 137 mmol/L (ref 135–146)
Total Bilirubin: 0.5 mg/dL (ref 0.2–1.2)
Total Protein: 7.5 g/dL (ref 6.1–8.1)
eGFR: 71 mL/min/{1.73_m2} (ref >=60)

## 2022-09-22 LAB — HEMOGLOBIN A1C
Hgb A1c MFr Bld: 6.9 % of total Hgb — ABNORMAL HIGH (ref <5.7)
Mean Plasma Glucose: 151 mg/dL
eAG (mmol/L): 8.4 mmol/L

## 2022-09-22 LAB — TSH: TSH: 0.87 mIU/L (ref 0.40–4.50)

## 2022-09-22 LAB — LIPID PANEL W/REFLEX DIRECT LDL
Cholesterol: 130 mg/dL (ref <200)
HDL: 46 mg/dL (ref >=40)
LDL Cholesterol (Calc): 66 mg/dL (calc)
Non-HDL Cholesterol (Calc): 84 mg/dL (calc) (ref <130)
Total CHOL/HDL Ratio: 2.8 (calc) (ref <5.0)
Triglycerides: 95 mg/dL (ref <150)

## 2022-09-22 LAB — PSA: PSA: 0.45 ng/mL (ref <=4.00)

## 2022-09-22 LAB — MICROALBUMIN / CREATININE URINE RATIO
Creatinine, Urine: 93 mg/dL (ref 20–320)
Microalb Creat Ratio: 85 mcg/mg creat — ABNORMAL HIGH (ref <30)
Microalb, Ur: 7.9 mg/dL

## 2022-10-17 ENCOUNTER — Telehealth: Payer: Self-pay

## 2022-10-17 NOTE — Telephone Encounter (Signed)
Initiated Prior authorization [id number] 2.5MG /0.5ML pen-injectors Via: Covermymeds Case/Key:[id number]  Status: Pending as of 10/17/22 Reason:VALID through 10/04/2022 - 10/05/2023 Notified Pt via: Mychart

## 2022-10-20 ENCOUNTER — Other Ambulatory Visit: Payer: Self-pay | Admitting: Family Medicine

## 2022-10-30 NOTE — Progress Notes (Signed)
[facility] Note  11/03/2022     CHIEF COMPLAINT Patient presents for Retina Follow Up   HISTORY OF PRESENT ILLNESS: Edward Riley is a 56 y.o. male who presents to the clinic today for:   HPI     Retina Follow Up   Patient presents with  Diabetic Retinopathy.  In both eyes.  Severity is moderate.  Duration of 7 weeks.  Since onset it is stable.  I, the attending physician,  performed the HPI with the patient and updated documentation appropriately.        Comments   Pt here for 7 wk ret f/u PDR OU. Pt states VA is the same. Got some new rx specs 2-3wks ago and doesn't care for them. Still adjusting.       Last edited by Bernarda Caffey, MD on 11/03/2022 12:56 PM.     Pt states   Referring physician: Luetta Nutting, DO [address]  [address] [place],  [place] [address]  HISTORICAL INFORMATION:   Selected notes from the MEDICAL RECORD NUMBER Referred by Dr. Luetta Nutting for diabetic eye exam LEE: [facility] Ocular Hx- PMH-    CURRENT MEDICATIONS: No current outpatient medications on file. (Ophthalmic Drugs)   No current facility-administered medications for this visit. (Ophthalmic Drugs)   Current Outpatient Medications (Other)  Medication Sig   AMBULATORY NON FORMULARY MEDICATION Bayer test strips and lancets. Use to check blood sugar twice a day. Dx: Type Two Diabetes Mellitus   amLODipine (NORVASC) 10 MG tablet TAKE 1 TABLET(10 MG) BY MOUTH DAILY   BD PEN NEEDLE NANO U/F 32G X 4 MM MISC USE AS DIRECTED   Blood Glucose Monitoring Suppl (ONE TOUCH ULTRA 2) w/Device KIT Use to check blood sugar twice a day. Send strips and lancets, quantity 100 refills 3.  Dx: Type Two Diabetes Mellitus   glucose blood (ONE TOUCH ULTRA TEST) test strip Use as instructed   hydrochlorothiazide (HYDRODIURIL) 25 MG tablet TAKE ONE TABLET BY MOUTH DAILY   Insulin Pen Needle (PEN NEEDLES) 31G X 8 MM MISC Use the pen needles for  both the insulin and victoza pens daily   Lancet Device MISC Check blood sugar once or twice daily as needed.   lisinopril (ZESTRIL) 20 MG tablet TAKE ONE TABLET BY MOUTH EVERY MORNING   pioglitazone (ACTOS) 45 MG tablet TAKE ONE TABLET BY MOUTH DAILY   rosuvastatin (CRESTOR) 20 MG tablet TAKE ONE TABLET BY MOUTH DAILY   tadalafil (CIALIS) 20 MG tablet Take 1 tablet (20 mg total) by mouth every other day.   tirzepatide (MOUNJARO) 15 MG/0.5ML Pen Inject 15 mg into the skin once a week.   XIGDUO XR 08-999 MG TB24 TAKE ONE TABLET BY MOUTH DAILY   No current facility-administered medications for this visit. (Other)   REVIEW OF SYSTEMS: ROS   Positive for: Endocrine, Cardiovascular, Eyes Negative for: Constitutional, Gastrointestinal, Neurological, Skin, Genitourinary, Musculoskeletal, HENT, Respiratory, Psychiatric, Allergic/Imm, Heme/Lymph Last edited by Kingsley Spittle, COT on 11/03/2022 10:04 AM.      ALLERGIES No Known Allergies  PAST MEDICAL HISTORY Past Medical History:  Diagnosis Date   Diabetes mellitus without complication ([place])    Hyperlipidemia    Hypertension    Obese    Retinal hemorrhage, both eyes    Sessile colonic polyp 02/04/2019   Colonoscopy February 2020.   History reviewed. No pertinent surgical history.  FAMILY HISTORY Family History  Problem Relation Age of Onset  Arthritis Mother    Hyperlipidemia Mother    Hypertension Mother    Cancer Father 54       Prostate (Metastatic) Lung, Esophagus, Stomach   Alcohol abuse Father    Kidney disease Brother    Hypertension Brother    Cancer Paternal Aunt        Breast Cancer   Alzheimer's disease Maternal Grandmother    Heart disease Paternal Grandmother    Diabetes Brother    Alcohol abuse Maternal Uncle    SOCIAL HISTORY Social History   Tobacco Use   Smoking status: Never   Smokeless tobacco: Never  Vaping Use   Vaping Use: Never used  Substance Use Topics   Alcohol use: Yes    Comment:  Rarely   Drug use: No       OPHTHALMIC EXAM: Base Eye Exam     Visual Acuity (Snellen - Linear)       Right Left   Dist cc 20/25 -2 20/25 -2    Correction: Glasses         Tonometry (Tonopen, 10:07 AM)       Right Left   Pressure 11 9         Pupils       Dark Light Shape React APD   Right 3 2 Round Brisk None   Left 3 2 Round Brisk None         Visual Fields (Counting fingers)       Left Right    Full Full         Extraocular Movement       Right Left    Full, Ortho Full, Ortho         Neuro/Psych     Oriented x3: Yes   Mood/Affect: Normal         Dilation     Both eyes: 1.0% Mydriacyl, 2.5% Phenylephrine @ 10:08 AM           Slit Lamp and Fundus Exam     Slit Lamp Exam       Right Left   Lids/Lashes Dermatochalasis - upper lid Dermatochalasis - upper lid   Conjunctiva/Sclera nasal pingeucula nasal and temporal pinguecula   Cornea trace PEE trace PEE   Anterior Chamber Deep and quiet Deep and quiet   Iris Round and dilated, No NVI Round and dilated, No NVI   Lens 2+ Nuclear sclerosis, 2+ Cortical cataract 2+ Nuclear sclerosis, 2+ Cortical cataract   Anterior Vitreous Mild Vitreous syneresis Mild Vitreous syneresis         Fundus Exam       Right Left   Disc Pink and Sharp, PPA, Compact mild tilt, Pink and Sharp, PPA   C/D Ratio 0.2 0.2   Macula good foveal reflex, trace MA, no exudates, +perifoveal cystic changes -- slightly improved Good foveal reflex, Microaneurysms -- slightly improved, trace Cystic changes temporal macula   Vessels ?NV temporal periphery-- regressing, attenuated, Tortuous mild attenuation, mild Copper wiring, trace focal fibrosis along distal IT arcade, ?NV temporal periphery, Tortuous   Periphery Attached, 360 PRP with good laser fill in, pre-retinal heme temporal periphery improved, scattered macroaneurysms / DBH- greatest posteriorly Attached, 360 PRP with good fill in, scattered IRH/DBH - improved            IMAGING AND PROCEDURES  Imaging and Procedures for 11/03/2022  OCT, Retina - OU - Both Eyes       Right Eye Quality was good. Central Foveal Thickness:  370. Progression has improved. Findings include no SRF, abnormal foveal contour, intraretinal fluid, vitreomacular adhesion (interval improvement in IRF temporal fovea, persistent changes parafovea, partial PVD).   Left Eye Quality was good. Central Foveal Thickness: 360. Progression has worsened. Findings include normal foveal contour, no IRF, no SRF, vitreomacular adhesion (Persistent cystic changes temporal mac -- slightly increased).   Notes *Images captured and stored on drive  Diagnosis / Impression:  +DME OU OD: interval improvement in IRF temporal fovea, persistent changes parafovea, partial PVD OS: Persistent  cystic changes temporal mac -- slightly increased  Clinical management:  See below  Abbreviations: NFP - Normal foveal profile. CME - cystoid macular edema. PED - pigment epithelial detachment. IRF - intraretinal fluid. SRF - subretinal fluid. EZ - ellipsoid zone. ERM - epiretinal membrane. ORA - outer retinal atrophy. ORT - outer retinal tubulation. SRHM - subretinal hyper-reflective material. IRHM - intraretinal hyper-reflective material      Intravitreal Injection, Pharmacologic Agent - OD - Right Eye       Time Out 11/03/2022. 11:30 AM. Confirmed correct patient, procedure, site, and patient consented.   Anesthesia Topical anesthesia was used. Anesthetic medications included Lidocaine 2%, Proparacaine 0.5%.   Procedure Preparation included 5% betadine to ocular surface, eyelid speculum. A supplied (32g) needle was used.   Injection: 1.25 mg Bevacizumab 1.68m/0.05ml   Route: Intravitreal, Site: Right Eye   NDC: 50242-060-01, Lot: [id number] , Expiration date: 12/04/2022   Post-op Post injection exam found visual acuity of at least counting fingers. The patient tolerated the procedure well.  There were no complications. The patient received written and verbal post procedure care education. Post injection medications were not given.      Intravitreal Injection, Pharmacologic Agent - OS - Left Eye       Time Out 11/03/2022. 11:30 AM. Confirmed correct patient, procedure, site, and patient consented.   Anesthesia Topical anesthesia was used. Anesthetic medications included Lidocaine 2%, Proparacaine 0.5%.   Procedure Preparation included 5% betadine to ocular surface, eyelid speculum. A (32 g) needle was used.   Injection: 1.25 mg Bevacizumab 1.267m0.05ml   Route: Intravitreal, Site: Left Eye   NDC: : 88416-606-30Lot: : [id number] date: 11/12/2022   Post-op Post injection exam found visual acuity of at least counting fingers. The patient tolerated the procedure well. There were no complications. The patient received written and verbal post procedure care education. Post injection medications were not given.            ASSESSMENT/PLAN:   ICD-10-CM   1. Proliferative diabetic retinopathy of both eyes with macular edema associated with type 2 diabetes mellitus (HCC)  E11.3513 OCT, Retina - OU - Both Eyes    Intravitreal Injection, Pharmacologic Agent - OD - Right Eye    Intravitreal Injection, Pharmacologic Agent - OS - Left Eye    Bevacizumab (AVASTIN) SOLN 1.25 mg    Bevacizumab (AVASTIN) SOLN 1.25 mg    2. Essential hypertension  I10     3. Hypertensive retinopathy of both eyes  H35.033     4. Combined forms of age-related cataract of both eyes  H25.813      1. 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, OU (OD > OS)  - last A1c 7.1 on 04.21.23  - [place] -- pt can't remember provider - exam shows scattered MA/IRH and 360 PRP, room for fill in OU - FA (9.19.22) shows late-leaking MA, vascular nonperfusion, +NV OU -- will need PRP fill  in OU - s/p PRP OD (09.29.22) - s/p PRP OS (11.14.22) - s/p  IVA OD #1 (09.19.22) - s/p IVA OS #1 (09.23.22) - s/p IVA OU #2 (10.21.22), #3 (12.23.22), #4 (01.06.23), #5 (02.03.23), #6 (03.03.23), #7 (04.07.23), #8 (05.19.23), #9 (07.14.23), #10 (09.01.23), #11 (10.20.23) **interval worsening of IRF at 8 weeks noted on 07.14.23 exam** - BCVA 20/25 OU - OCT shows OD: interval improvement in IRF temporal fovea, persistent changes parafovea, partial PVD; OS: Persistent  cystic changes temporal mac -- slightly increased - recommend IVA OU #12 today, 12.08.23 for DME with f/u 7 weeks - pt wishes to proceed  - RBA of procedure discussed, questions answered - informed consent obtained and signed - see procedure notes - will check IVE auth for next visit - f/u 7 weeks DFE/OCT, possible injection(s)  2,3. Hypertensive retinopathy OU - discussed importance of tight BP control - continue to monitor   4. Mixed Cataract OU - The symptoms of cataract, surgical options, and treatments and risks were discussed with patient. - discussed diagnosis and progression - not yet visually significant - monitor for now  Ophthalmic Meds Ordered this visit:  Meds ordered this encounter  Medications   Bevacizumab (AVASTIN) SOLN 1.25 mg   Bevacizumab (AVASTIN) SOLN 1.25 mg     Return in about 7 weeks (around 12/22/2022) for f/u PDR OU, DFE, OCT.  There are no Patient Instructions on file for this visit.   Explained the diagnoses, plan, and follow up with the patient and they expressed understanding.  Patient expressed understanding of the importance of proper follow up care.   This document serves as a record of services personally performed by Gardiner Sleeper, MD, PhD. It was created on their behalf by San Jetty. Owens Shark, OA an ophthalmic technician. The creation of this record is the provider's dictation and/or activities during the visit.    Electronically signed by: San Jetty. Owens Shark, [place] 12.04.2023 2:07 AM  Gardiner Sleeper, M.D., Ph.D. Diseases & Surgery of the  Retina and Vitreous [facility]  I have reviewed the above documentation for accuracy and completeness, and I agree with the above. Gardiner Sleeper, M.D., Ph.D. 11/06/22 2:08 AM  Abbreviations: M myopia (nearsighted); A astigmatism; H hyperopia (farsighted); P presbyopia; Mrx spectacle prescription;  CTL contact lenses; OD right eye; OS left eye; OU both eyes  XT exotropia; ET esotropia; PEK punctate epithelial keratitis; PEE punctate epithelial erosions; DES dry eye syndrome; MGD meibomian gland dysfunction; ATs artificial tears; PFAT's preservative free artificial tears; [place] nuclear sclerotic cataract; PSC posterior subcapsular cataract; ERM epi-retinal membrane; PVD posterior vitreous detachment; RD retinal detachment; DM diabetes mellitus; DR diabetic retinopathy; NPDR non-proliferative diabetic retinopathy; PDR proliferative diabetic retinopathy; CSME clinically significant macular edema; DME diabetic macular edema; dbh dot blot hemorrhages; CWS cotton wool spot; POAG primary open angle glaucoma; C/D cup-to-disc ratio; HVF humphrey visual field; GVF goldmann visual field; OCT optical coherence tomography; IOP intraocular pressure; BRVO Branch retinal vein occlusion; CRVO central retinal vein occlusion; CRAO central retinal artery occlusion; BRAO branch retinal artery occlusion; RT retinal tear; SB scleral buckle; PPV pars plana vitrectomy; VH Vitreous hemorrhage; PRP panretinal laser photocoagulation; IVK intravitreal kenalog; VMT vitreomacular traction; MH Macular hole;  NVD neovascularization of the disc; NVE neovascularization elsewhere; AREDS age related eye disease study; ARMD age related macular degeneration; POAG primary open angle glaucoma; EBMD epithelial/anterior basement membrane dystrophy; ACIOL anterior chamber intraocular lens; IOL intraocular lens; PCIOL posterior chamber intraocular lens; Phaco/IOL phacoemulsification with  intraocular lens placement; [place]  photorefractive keratectomy; LASIK laser assisted in situ keratomileusis; HTN hypertension; DM diabetes mellitus; COPD chronic obstructive pulmonary disease

## 2022-11-03 ENCOUNTER — Encounter (INDEPENDENT_AMBULATORY_CARE_PROVIDER_SITE_OTHER): Payer: Self-pay | Admitting: Ophthalmology

## 2022-11-03 ENCOUNTER — Ambulatory Visit (INDEPENDENT_AMBULATORY_CARE_PROVIDER_SITE_OTHER): Payer: No Typology Code available for payment source | Admitting: Ophthalmology

## 2022-11-03 DIAGNOSIS — E113513 Type 2 diabetes mellitus with proliferative diabetic retinopathy with macular edema, bilateral: Secondary | ICD-10-CM | POA: Diagnosis not present

## 2022-11-03 DIAGNOSIS — I10 Essential (primary) hypertension: Secondary | ICD-10-CM | POA: Diagnosis not present

## 2022-11-03 DIAGNOSIS — H25813 Combined forms of age-related cataract, bilateral: Secondary | ICD-10-CM | POA: Diagnosis not present

## 2022-11-03 DIAGNOSIS — H35033 Hypertensive retinopathy, bilateral: Secondary | ICD-10-CM | POA: Diagnosis not present

## 2022-11-03 MED ORDER — BEVACIZUMAB CHEMO INJECTION 1.25MG/0.05ML SYRINGE FOR KALEIDOSCOPE
1.2500 mg | INTRAVITREAL | Status: AC | PRN
  Administered 2022-11-03: 1.25 mg via INTRAVITREAL

## 2022-12-20 NOTE — Progress Notes (Shared)
[facility] Note  12/22/2022     CHIEF COMPLAINT Patient presents for No chief complaint on file.   HISTORY OF PRESENT ILLNESS: Edward Riley is a 57 y.o. male who presents to the clinic today for:     Pt states   Referring physician: Luetta Nutting, DO [address] [facility] [address] [place],  [place] [address]  HISTORICAL INFORMATION:   Selected notes from the MEDICAL RECORD NUMBER Referred by Dr. Luetta Nutting for diabetic eye exam LEE: [facility] Ocular Hx- PMH-    CURRENT MEDICATIONS: No current outpatient medications on file. (Ophthalmic Drugs)   No current facility-administered medications for this visit. (Ophthalmic Drugs)   Current Outpatient Medications (Other)  Medication Sig   AMBULATORY NON FORMULARY MEDICATION Bayer test strips and lancets. Use to check blood sugar twice a day. Dx: Type Two Diabetes Mellitus   amLODipine (NORVASC) 10 MG tablet TAKE 1 TABLET(10 MG) BY MOUTH DAILY   BD PEN NEEDLE NANO U/F 32G X 4 MM MISC USE AS DIRECTED   Blood Glucose Monitoring Suppl (ONE TOUCH ULTRA 2) w/Device KIT Use to check blood sugar twice a day. Send strips and lancets, quantity 100 refills 3.  Dx: Type Two Diabetes Mellitus   glucose blood (ONE TOUCH ULTRA TEST) test strip Use as instructed   hydrochlorothiazide (HYDRODIURIL) 25 MG tablet TAKE ONE TABLET BY MOUTH DAILY   Insulin Pen Needle (PEN NEEDLES) 31G X 8 MM MISC Use the pen needles for both the insulin and victoza pens daily   Lancet Device MISC Check blood sugar once or twice daily as needed.   lisinopril (ZESTRIL) 20 MG tablet TAKE ONE TABLET BY MOUTH EVERY MORNING   pioglitazone (ACTOS) 45 MG tablet TAKE ONE TABLET BY MOUTH DAILY   rosuvastatin (CRESTOR) 20 MG tablet TAKE ONE TABLET BY MOUTH DAILY   tadalafil (CIALIS) 20 MG tablet Take 1 tablet (20 mg total) by mouth every other day.   tirzepatide (MOUNJARO) 15 MG/0.5ML Pen Inject 15 mg into the skin  once a week.   XIGDUO XR 08-999 MG TB24 TAKE ONE TABLET BY MOUTH DAILY   No current facility-administered medications for this visit. (Other)   REVIEW OF SYSTEMS:    ALLERGIES No Known Allergies  PAST MEDICAL HISTORY Past Medical History:  Diagnosis Date   Diabetes mellitus without complication ([place])    Hyperlipidemia    Hypertension    Obese    Retinal hemorrhage, both eyes    Sessile colonic polyp 02/04/2019   Colonoscopy February 2020.   No past surgical history on file.  FAMILY HISTORY Family History  Problem Relation Age of Onset   Arthritis Mother    Hyperlipidemia Mother    Hypertension Mother    Cancer Father 63       Prostate (Metastatic) Lung, Esophagus, Stomach   Alcohol abuse Father    Kidney disease Brother    Hypertension Brother    Cancer Paternal Aunt        Breast Cancer   Alzheimer's disease Maternal Grandmother    Heart disease Paternal Grandmother    Diabetes Brother    Alcohol abuse Maternal Uncle    SOCIAL HISTORY Social History   Tobacco Use   Smoking status: Never   Smokeless tobacco: Never  Vaping Use   Vaping Use: Never used  Substance Use Topics   Alcohol use: Yes    Comment: Rarely   Drug use: No  OPHTHALMIC EXAM: Not recorded    IMAGING AND PROCEDURES  Imaging and Procedures for 12/22/2022          ASSESSMENT/PLAN: No diagnosis found.  1. 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, OU (OD > OS)  - last A1c 7.1 on 04.21.23  - pt with history of PRP laser OU in [place] ~10+ yrs ago -- pt can't remember provider - exam shows scattered MA/IRH and 360 PRP, room for fill in OU - FA (9.19.22) shows late-leaking MA, vascular nonperfusion, +NV OU -- will need PRP fill in OU - s/p PRP OD (09.29.22) - s/p PRP OS (11.14.22) - s/p IVA OD #1 (09.19.22) - s/p IVA OS #1 (09.23.22) - s/p IVA OU #2 (10.21.22), #3 (12.23.22), #4 (01.06.23), #5 (02.03.23), #6 (03.03.23), #7 (04.07.23), #8 (05.19.23), #9 (07.14.23),  #10 (09.01.23), #11 (10.20.23) #12(11/03/2022) #13 (12/22/2022) **interval worsening of IRF at 8 weeks noted on 07.14.23 exam** - BCVA 20/25 OU - OCT shows OD: interval improvement in IRF temporal fovea, persistent changes parafovea, partial PVD; OS: Persistent  cystic changes temporal mac -- slightly increased - recommend IVA OU #13 today, 12/22/2022 for DME with f/u 7 weeks - pt wishes to proceed  - RBA of procedure discussed, questions answered - informed consent obtained and signed - see procedure notes - will check IVE auth for next visit - f/u 7 weeks DFE/OCT, possible injection(s)  2,3. Hypertensive retinopathy OU - discussed importance of tight BP control - monitor for now  4. Mixed Cataract OU - The symptoms of cataract, surgical options, and treatments and risks were discussed with patient. - discussed diagnosis and progression - not yet visually significant - monitor for now  Ophthalmic Meds Ordered this visit:  No orders of the defined types were placed in this encounter.    No follow-ups on file.  There are no Patient Instructions on file for this visit.    This document serves as a record of services personally performed by Gardiner Sleeper, MD, PhD. It was created on their behalf by Parthenia Ames, COT, an ophthalmic technician. The creation of this record is the provider's dictation and/or activities during the visit.    Electronically signed by: Parthenia Ames, COT 12/20/2022 3:14 PM   Gardiner Sleeper, M.D., Ph.D. Diseases & Surgery of the Retina and Vitreous [facility]  I have reviewed the above documentation for accuracy and completeness, and I agree with the above. Gardiner Sleeper, M.D., Ph.D. 11/06/22 3:14 PM  Abbreviations: M myopia (nearsighted); A astigmatism; H hyperopia (farsighted); P presbyopia; Mrx spectacle prescription;  CTL contact lenses; OD right eye; OS left eye; OU both eyes  XT exotropia; ET  esotropia; PEK punctate epithelial keratitis; PEE punctate epithelial erosions; DES dry eye syndrome; MGD meibomian gland dysfunction; ATs artificial tears; PFAT's preservative free artificial tears; [place] nuclear sclerotic cataract; PSC posterior subcapsular cataract; ERM epi-retinal membrane; PVD posterior vitreous detachment; RD retinal detachment; DM diabetes mellitus; DR diabetic retinopathy; NPDR non-proliferative diabetic retinopathy; PDR proliferative diabetic retinopathy; CSME clinically significant macular edema; DME diabetic macular edema; dbh dot blot hemorrhages; CWS cotton wool spot; POAG primary open angle glaucoma; C/D cup-to-disc ratio; HVF humphrey visual field; GVF goldmann visual field; OCT optical coherence tomography; IOP intraocular pressure; BRVO Branch retinal vein occlusion; CRVO central retinal vein occlusion; CRAO central retinal artery occlusion; BRAO branch retinal artery occlusion; RT retinal tear; SB scleral buckle; PPV pars plana vitrectomy; VH Vitreous hemorrhage; PRP panretinal laser photocoagulation; IVK intravitreal kenalog; VMT  vitreomacular traction; MH Macular hole;  NVD neovascularization of the disc; NVE neovascularization elsewhere; AREDS age related eye disease study; ARMD age related macular degeneration; POAG primary open angle glaucoma; EBMD epithelial/anterior basement membrane dystrophy; ACIOL anterior chamber intraocular lens; IOL intraocular lens; PCIOL posterior chamber intraocular lens; Phaco/IOL phacoemulsification with intraocular lens placement; [place] photorefractive keratectomy; LASIK laser assisted in situ keratomileusis; HTN hypertension; DM diabetes mellitus; COPD chronic obstructive pulmonary disease

## 2022-12-22 ENCOUNTER — Encounter (INDEPENDENT_AMBULATORY_CARE_PROVIDER_SITE_OTHER): Payer: No Typology Code available for payment source | Admitting: Ophthalmology

## 2022-12-22 DIAGNOSIS — H25813 Combined forms of age-related cataract, bilateral: Secondary | ICD-10-CM

## 2022-12-22 DIAGNOSIS — I10 Essential (primary) hypertension: Secondary | ICD-10-CM

## 2022-12-22 DIAGNOSIS — H35033 Hypertensive retinopathy, bilateral: Secondary | ICD-10-CM

## 2022-12-22 DIAGNOSIS — E113513 Type 2 diabetes mellitus with proliferative diabetic retinopathy with macular edema, bilateral: Secondary | ICD-10-CM

## 2023-01-04 NOTE — Progress Notes (Addendum)
[facility] Note  01/05/2023     CHIEF COMPLAINT Patient presents for Retina Follow Up   HISTORY OF PRESENT ILLNESS: Edward Riley is a 57 y.o. male who presents to the clinic today for:   HPI     Retina Follow Up   Patient presents with  Diabetic Retinopathy.  In both eyes.  This started 7 weeks ago.  I, the attending physician,  performed the HPI with the patient and updated documentation appropriately.        Comments   Patient here for 7 weeks retina follow up for PDR OU. Patient states vision about the same. Don't like new glasses.      Last edited by Bernarda Caffey, MD on 01/05/2023 12:00 PM.    Pt is delayed to follow up from 7 weeks to 9 weeks (12.08.24 - 02.09.24), pt states he got stuck out on the road, he feels like vision is stable, but he said he needs to get back on his diet  Referring physician: Luetta Nutting, DO [address] [place],   [address]  HISTORICAL INFORMATION:   Selected notes from the [facility] Referred by Dr. Luetta Nutting for diabetic eye exam LEE: [facility] Ocular Hx- PMH-    CURRENT MEDICATIONS: No current outpatient medications on file. (Ophthalmic Drugs)   No current facility-administered medications for this visit. (Ophthalmic Drugs)   Current Outpatient Medications (Other)  Medication Sig   AMBULATORY NON FORMULARY MEDICATION Bayer test strips and lancets. Use to check blood sugar twice a day. Dx: Type Two Diabetes Mellitus   amLODipine (NORVASC) 10 MG tablet TAKE 1 TABLET(10 MG) BY MOUTH DAILY   BD PEN NEEDLE NANO U/F 32G X 4 MM MISC USE AS DIRECTED   Blood Glucose Monitoring Suppl (ONE TOUCH ULTRA 2) w/Device KIT Use to check blood sugar twice a day. Send strips and lancets, quantity 100 refills 3.  Dx: Type Two Diabetes Mellitus   glucose blood (ONE TOUCH ULTRA TEST) test strip Use as instructed   hydrochlorothiazide (HYDRODIURIL) 25 MG  tablet TAKE ONE TABLET BY MOUTH DAILY   Insulin Pen Needle (PEN NEEDLES) 31G X 8 MM MISC Use the pen needles for both the insulin and victoza pens daily   Lancet Device MISC Check blood sugar once or twice daily as needed.   lisinopril (ZESTRIL) 20 MG tablet TAKE ONE TABLET BY MOUTH EVERY MORNING   pioglitazone (ACTOS) 45 MG tablet TAKE ONE TABLET BY MOUTH DAILY   rosuvastatin (CRESTOR) 20 MG tablet TAKE ONE TABLET BY MOUTH DAILY   tadalafil (CIALIS) 20 MG tablet Take 1 tablet (20 mg total) by mouth every other day.   tirzepatide (MOUNJARO) 15 MG/0.5ML Pen Inject 15 mg into the skin once a week.   XIGDUO XR 08-999 MG TB24 TAKE ONE TABLET BY MOUTH DAILY   No current facility-administered medications for this visit. (Other)   REVIEW OF SYSTEMS: ROS   Positive for: Endocrine, Cardiovascular, Eyes Negative for: Constitutional, Gastrointestinal, Neurological, Skin, Genitourinary, Musculoskeletal, HENT, Respiratory, Psychiatric, Allergic/Imm, Heme/Lymph Last edited by Theodore Demark, COA on 01/05/2023 10:04 AM.     ALLERGIES No Known Allergies  PAST MEDICAL HISTORY Past Medical History:  Diagnosis Date   Diabetes mellitus without complication ([place])    Hyperlipidemia    Hypertension    Obese    Retinal hemorrhage, both eyes    Sessile colonic polyp 02/04/2019   Colonoscopy February 2020.  History reviewed. No pertinent surgical history.  FAMILY HISTORY Family History  Problem Relation Age of Onset   Arthritis Mother    Hyperlipidemia Mother    Hypertension Mother    Cancer Father 1       Prostate (Metastatic) Lung, Esophagus, Stomach   Alcohol abuse Father    Kidney disease Brother    Hypertension Brother    Cancer Paternal Aunt        Breast Cancer   Alzheimer's disease Maternal Grandmother    Heart disease Paternal Grandmother    Diabetes Brother    Alcohol abuse Maternal Uncle    SOCIAL HISTORY Social History   Tobacco Use   Smoking status: Never   Smokeless  tobacco: Never  Vaping Use   Vaping Use: Never used  Substance Use Topics   Alcohol use: Yes    Comment: Rarely   Drug use: No       OPHTHALMIC EXAM: Base Eye Exam     Visual Acuity (Snellen - Linear)       Right Left   Dist cc 20/20 -2 20/20    Correction: Glasses         Tonometry (Tonopen, 10:02 AM)       Right Left   Pressure 13 13         Pupils       Dark Light Shape React APD   Right 3 2 Round Brisk None   Left 3 2 Round Brisk None         Visual Fields (Counting fingers)       Left Right    Full Full         Extraocular Movement       Right Left    Full, Ortho Full, Ortho         Neuro/Psych     Oriented x3: Yes   Mood/Affect: Normal         Dilation     Both eyes: 1.0% Mydriacyl, 2.5% Phenylephrine @ 10:02 AM           Slit Lamp and Fundus Exam     Slit Lamp Exam       Right Left   Lids/Lashes Dermatochalasis - upper lid Dermatochalasis - upper lid   Conjunctiva/Sclera nasal pingeucula nasal and temporal pinguecula   Cornea trace PEE trace PEE   Anterior Chamber Deep and quiet Deep and quiet   Iris Round and dilated, No NVI Round and dilated, No NVI   Lens 2+ Nuclear sclerosis, 2+ Cortical cataract 2+ Nuclear sclerosis, 2+ Cortical cataract   Anterior Vitreous Mild Vitreous syneresis Mild Vitreous syneresis         Fundus Exam       Right Left   Disc Pink and Sharp, PPA, Compact mild tilt, Pink and Sharp, PPA   C/D Ratio 0.2 0.2   Macula good foveal reflex, trace MA, no exudates, +perifoveal cystic changes -- improved Good foveal reflex, Microaneurysms -- slightly improved, trace Cystic changes temporal macula -- improved   Vessels ?NV temporal periphery-- regressing, mild attenuation, mild tortuosity trace focal fibrosis along distal IT arcade, ?NV temporal periphery, attenuated, Tortuous   Periphery Attached, 360 PRP with good laser fill in, pre-retinal heme temporal periphery improved, scattered macroaneurysms  / DBH- greatest posteriorly Attached, 360 PRP with good fill in, scattered IRH/DBH - improved           Refraction     Wearing Rx       Sphere  Cylinder Axis   Right -2.50 +1.00 138   Left -2.25 +1.00 173           IMAGING AND PROCEDURES  Imaging and Procedures for 01/05/2023  OCT, Retina - OU - Both Eyes       Right Eye Quality was good. Central Foveal Thickness: 360. Progression has improved. Findings include normal foveal contour, no SRF, intraretinal fluid, vitreomacular adhesion (interval improvement in IRF -- just trace persistent cystic changes parafovea, partial PVD).   Left Eye Quality was good. Central Foveal Thickness: 357. Progression has improved. Findings include normal foveal contour, no IRF, no SRF, vitreomacular adhesion (Interval improvement in cystic changes temporal mac -- just trace cystic changes remain).   Notes *Images captured and stored on drive  Diagnosis / Impression:  +DME OU OD: interval improvement in IRF -- just trace persistent cystic changes parafovea, partial PVD OS: interval improvement in IRF temporal mac -- just trace cystic changes remain  Clinical management:  See below  Abbreviations: NFP - Normal foveal profile. CME - cystoid macular edema. PED - pigment epithelial detachment. IRF - intraretinal fluid. SRF - subretinal fluid. EZ - ellipsoid zone. ERM - epiretinal membrane. ORA - outer retinal atrophy. ORT - outer retinal tubulation. SRHM - subretinal hyper-reflective material. IRHM - intraretinal hyper-reflective material      Intravitreal Injection, Pharmacologic Agent - OD - Right Eye       Time Out 01/05/2023. 10:48 AM. Confirmed correct patient, procedure, site, and patient consented.   Anesthesia Topical anesthesia was used. Anesthetic medications included Lidocaine 2%, Proparacaine 0.5%.   Procedure Preparation included 5% betadine to ocular surface, eyelid speculum. A supplied (32g) needle was used.    Injection: 1.25 mg Bevacizumab 1.52m/0.05ml   Route: Intravitreal, Site: Right Eye   NDC:[id number] Lot: [id number] , Expiration date: 02/09/2023   Post-op Post injection exam found visual acuity of at least counting fingers. The patient tolerated the procedure well. There were no complications. The patient received written and verbal post procedure care education. Post injection medications were not given.      Intravitreal Injection, Pharmacologic Agent - OS - Left Eye       Time Out 01/05/2023. 11:02 AM. Confirmed correct patient, procedure, site, and patient consented.   Anesthesia Topical anesthesia was used. Anesthetic medications included Lidocaine 2%, Proparacaine 0.5%.   Procedure Preparation included 5% betadine to ocular surface, eyelid speculum. A supplied (32 g) needle was used.   Injection: 1.25 mg Bevacizumab 1.277m0.05ml   Route: Intravitreal, Site: Left Eye   NDC: 30mLot: [id number] date: 02/13/2023   Post-op Post injection exam found visual acuity of at least counting fingers. The patient tolerated the procedure well. There were no complications. The patient received written and verbal post procedure care education. Post injection medications were not given.            ASSESSMENT/PLAN:   ICD-10-CM   1. Proliferative diabetic retinopathy of both eyes with macular edema associated with type 2 diabetes mellitus (HCC)  E11.3513 OCT, Retina - OU - Both Eyes    Intravitreal Injection, Pharmacologic Agent - OD - Right Eye    Intravitreal Injection, Pharmacologic Agent - OS - Left Eye    Bevacizumab (AVASTIN) SOLN 1.25 mg    Bevacizumab (AVASTIN) SOLN 1.25 mg    CANCELED: Intravitreal Injection, Pharmacologic Agent - OD - Right Eye    2. Essential hypertension  I10     3. Hypertensive retinopathy of both eyes  H35.033  4. Combined forms of age-related cataract of both eyes  H25.813       1. 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  DME, OU (OD > OS)  - delayed f/u--9 wks instead of 7 wks due to work  - last A1c 7.1 on 04.21.23  - pt with history of PRP laser OU in [place] ~10+ yrs ago -- pt can't remember provider - exam shows scattered MA/IRH and 360 PRP, room for fill in OU - FA (9.19.22) shows late-leaking MA, vascular nonperfusion, +NV OU -- will need PRP fill in OU - s/p PRP OD (09.29.22) - s/p PRP OS (11.14.22) - s/p IVA OD #1 (09.19.22) - s/p IVA OS #1 (09.23.22) - s/p IVA OU #2 (10.21.22), #3 (12.23.22), #4 (01.06.23), #5 (02.03.23), #6 (03.03.23), #7 (04.07.23), #8 (05.19.23), #9 (07.14.23), #10 (09.01.23), #11 (10.20.23) #12 (12.08.23) **interval worsening of IRF at 8 weeks noted on 07.14.23 exam** - BCVA 20/20 OU - OCT shows OD: interval improvement in IRF -- just trace persistent cystic changes parafovea, partial PVD; OS: interval improvement in IRF temporal mac -- just trace cystic changes remain at 9 weeks - recommend IVA OU #13 today, 02.09.24 for DME with f/u in 10 weeks - pt wishes to proceed  - RBA of procedure discussed, questions answered - informed consent obtained and signed - see procedure notes - will check IVE auth for next visit - f/u 10 weeks DFE/OCT, possible injection(s)  2,3. Hypertensive retinopathy OU - discussed importance of tight BP control -  monitor for now  4. Mixed Cataract OU - The symptoms of cataract, surgical options, and treatments and risks were discussed with patient. - discussed diagnosis and progression - not yet visually significant - monitor for now  Ophthalmic Meds Ordered this visit:  Meds ordered this encounter  Medications   Bevacizumab (AVASTIN) SOLN 1.25 mg   Bevacizumab (AVASTIN) SOLN 1.25 mg     Return in about 10 weeks (around 03/16/2023) for f/u PDR OU, DFE, OCT.  There are no Patient Instructions on file for this visit.   This document serves as a record of services personally performed by Gardiner Sleeper, MD, PhD. It was created on  their behalf by Joetta Manners COT, an ophthalmic technician. The creation of this record is the provider's dictation and/or activities during the visit.    Electronically signed by: Joetta Manners COT 02.08.24 12:14 PM  This document serves as a record of services personally performed by Gardiner Sleeper, MD, PhD. It was created on their behalf by San Jetty. Owens Shark, OA an ophthalmic technician. The creation of this record is the provider's dictation and/or activities during the visit.    Electronically signed by: San Jetty. Marguerita Merles 02.09.2024 12:14 PM  Gardiner Sleeper, M.D., Ph.D. Diseases & Surgery of the Retina and [place] 01/05/2023    I have reviewed the above documentation for accuracy and completeness, and I agree with the above. Gardiner Sleeper, M.D., Ph.D. 01/05/23 12:14 PM   Abbreviations: M myopia (nearsighted); A astigmatism; H hyperopia (farsighted); P presbyopia; Mrx spectacle prescription;  CTL contact lenses; OD right eye; OS left eye; OU both eyes  XT exotropia; ET esotropia; PEK punctate epithelial keratitis; PEE punctate epithelial erosions; DES dry eye syndrome; MGD meibomian gland dysfunction; ATs artificial tears; PFAT's preservative free artificial tears; [place] nuclear sclerotic cataract; PSC posterior subcapsular cataract; ERM epi-retinal membrane; PVD posterior vitreous detachment; RD retinal detachment; DM diabetes mellitus; DR diabetic retinopathy; NPDR non-proliferative diabetic retinopathy;  PDR proliferative diabetic retinopathy; CSME clinically significant macular edema; DME diabetic macular edema; dbh dot blot hemorrhages; CWS cotton wool spot; POAG primary open angle glaucoma; C/D cup-to-disc ratio; HVF humphrey visual field; GVF goldmann visual field; OCT optical coherence tomography; IOP intraocular pressure; BRVO Branch retinal vein occlusion; CRVO central retinal vein occlusion; CRAO central retinal artery occlusion; BRAO  branch retinal artery occlusion; RT retinal tear; SB scleral buckle; PPV pars plana vitrectomy; VH Vitreous hemorrhage; PRP panretinal laser photocoagulation; IVK intravitreal kenalog; VMT vitreomacular traction; MH Macular hole;  NVD neovascularization of the disc; NVE neovascularization elsewhere; AREDS age related eye disease study; ARMD age related macular degeneration; POAG primary open angle glaucoma; EBMD epithelial/anterior basement membrane dystrophy; ACIOL anterior chamber intraocular lens; IOL intraocular lens; PCIOL posterior chamber intraocular lens; Phaco/IOL phacoemulsification with intraocular lens placement; [place] photorefractive keratectomy; LASIK laser assisted in situ keratomileusis; HTN hypertension; DM diabetes mellitus; COPD chronic obstructive pulmonary disease

## 2023-01-05 ENCOUNTER — Ambulatory Visit (INDEPENDENT_AMBULATORY_CARE_PROVIDER_SITE_OTHER): Payer: No Typology Code available for payment source | Admitting: Ophthalmology

## 2023-01-05 ENCOUNTER — Encounter (INDEPENDENT_AMBULATORY_CARE_PROVIDER_SITE_OTHER): Payer: Self-pay | Admitting: Ophthalmology

## 2023-01-05 DIAGNOSIS — E113513 Type 2 diabetes mellitus with proliferative diabetic retinopathy with macular edema, bilateral: Secondary | ICD-10-CM

## 2023-01-05 DIAGNOSIS — H35033 Hypertensive retinopathy, bilateral: Secondary | ICD-10-CM | POA: Diagnosis not present

## 2023-01-05 DIAGNOSIS — I10 Essential (primary) hypertension: Secondary | ICD-10-CM

## 2023-01-05 DIAGNOSIS — H25813 Combined forms of age-related cataract, bilateral: Secondary | ICD-10-CM

## 2023-01-05 MED ORDER — BEVACIZUMAB CHEMO INJECTION 1.25MG/0.05ML SYRINGE FOR KALEIDOSCOPE
1.2500 mg | INTRAVITREAL | Status: AC | PRN
  Administered 2023-01-05: 1.25 mg via INTRAVITREAL

## 2023-02-04 ENCOUNTER — Other Ambulatory Visit: Payer: Self-pay | Admitting: Family Medicine

## 2023-02-07 ENCOUNTER — Encounter: Payer: Self-pay | Admitting: Family Medicine

## 2023-02-08 MED ORDER — TIRZEPATIDE 5 MG/0.5ML ~~LOC~~ SOAJ: 5.0000 mg | SUBCUTANEOUS | 1 refills | Status: DC

## 2023-03-11 ENCOUNTER — Other Ambulatory Visit: Payer: Self-pay | Admitting: Family Medicine

## 2023-03-13 ENCOUNTER — Other Ambulatory Visit: Payer: Self-pay | Admitting: Family Medicine

## 2023-03-13 DIAGNOSIS — I152 Hypertension secondary to endocrine disorders: Secondary | ICD-10-CM

## 2023-03-16 ENCOUNTER — Ambulatory Visit (INDEPENDENT_AMBULATORY_CARE_PROVIDER_SITE_OTHER): Payer: No Typology Code available for payment source | Admitting: Ophthalmology

## 2023-03-16 ENCOUNTER — Encounter (INDEPENDENT_AMBULATORY_CARE_PROVIDER_SITE_OTHER): Payer: Self-pay | Admitting: Ophthalmology

## 2023-03-16 DIAGNOSIS — I10 Essential (primary) hypertension: Secondary | ICD-10-CM | POA: Diagnosis not present

## 2023-03-16 DIAGNOSIS — H25813 Combined forms of age-related cataract, bilateral: Secondary | ICD-10-CM | POA: Diagnosis not present

## 2023-03-16 DIAGNOSIS — E113513 Type 2 diabetes mellitus with proliferative diabetic retinopathy with macular edema, bilateral: Secondary | ICD-10-CM | POA: Diagnosis not present

## 2023-03-16 DIAGNOSIS — H35033 Hypertensive retinopathy, bilateral: Secondary | ICD-10-CM | POA: Diagnosis not present

## 2023-03-16 MED ORDER — BEVACIZUMAB CHEMO INJECTION 1.25MG/0.05ML SYRINGE FOR KALEIDOSCOPE
1.2500 mg | INTRAVITREAL | Status: AC | PRN
  Administered 2023-03-16: 1.25 mg via INTRAVITREAL

## 2023-03-16 NOTE — Progress Notes (Signed)
[facility] - Clinic Note  03/16/2023     CHIEF COMPLAINT Patient presents for Retina Follow Up   HISTORY OF PRESENT ILLNESS: Edward Riley is a 56 y.o. male who presents to the clinic today for:   HPI     Retina Follow Up   Patient presents with  Diabetic Retinopathy.  In both eyes.  This started 10 weeks ago.  I, the attending physician,  performed the HPI with the patient and updated documentation appropriately.        Comments   Patient here for 10 weeks retina follow up for PDR OU. Patient states vision no difference. No eye pain.       Last edited by Rennis Chris, MD on 03/16/2023 11:45 AM.      Patient feels that the vision is the same.    Referring physician: Everrett Coombe, DO [address]  [address] [place],  [place] [address]  HISTORICAL INFORMATION:   Selected notes from the MEDICAL RECORD NUMBER Referred by Dr. Everrett Coombe for diabetic eye exam LEE: [facility] Ocular Hx- PMH-    CURRENT MEDICATIONS: No current outpatient medications on file. (Ophthalmic Drugs)   No current facility-administered medications for this visit. (Ophthalmic Drugs)   Current Outpatient Medications (Other)  Medication Sig   AMBULATORY NON FORMULARY MEDICATION Bayer test strips and lancets. Use to check blood sugar twice a day. Dx: Type Two Diabetes Mellitus   amLODipine (NORVASC) 10 MG tablet TAKE 1 TABLET BY MOUTH DAILY   BD PEN NEEDLE NANO U/F 32G X 4 MM MISC USE AS DIRECTED   Blood Glucose Monitoring Suppl (ONE TOUCH ULTRA 2) w/Device KIT Use to check blood sugar twice a day. Send strips and lancets, quantity 100 refills 3.  Dx: Type Two Diabetes Mellitus   glucose blood (ONE TOUCH ULTRA TEST) test strip Use as instructed   hydrochlorothiazide (HYDRODIURIL) 25 MG tablet TAKE ONE TABLET BY MOUTH DAILY   Insulin Pen Needle (PEN NEEDLES) 31G X 8 MM MISC Use the pen needles for both the insulin and victoza pens daily    Lancet Device MISC Check blood sugar once or twice daily as needed.   lisinopril (ZESTRIL) 20 MG tablet TAKE ONE TABLET BY MOUTH EVERY MORNING   pioglitazone (ACTOS) 45 MG tablet TAKE ONE TABLET BY MOUTH DAILY   rosuvastatin (CRESTOR) 20 MG tablet TAKE ONE TABLET BY MOUTH DAILY   tadalafil (CIALIS) 20 MG tablet Take 1 tablet (20 mg total) by mouth every other day.   tirzepatide (MOUNJARO) 10 MG/0.5ML Pen INJECT 10 MG UNDER THE SKIN ONCE WEEKLY   tirzepatide (MOUNJARO) 15 MG/0.5ML Pen Inject 15 mg into the skin once a week.   tirzepatide [facility]) 5 MG/0.5ML Pen Inject 5 mg into the skin once a week.   XIGDUO XR 08-999 MG TB24 TAKE ONE TABLET BY MOUTH DAILY   No current facility-administered medications for this visit. (Other)   REVIEW OF SYSTEMS: ROS   Positive for: Endocrine, Cardiovascular, Eyes Negative for: Constitutional, Gastrointestinal, Neurological, Skin, Genitourinary, Musculoskeletal, HENT, Respiratory, Psychiatric, Allergic/Imm, Heme/Lymph Last edited by Laddie Aquas, COA on 03/16/2023  9:58 AM.     ALLERGIES No Known Allergies  PAST MEDICAL HISTORY Past Medical History:  Diagnosis Date   Diabetes mellitus without complication    Hyperlipidemia    Hypertension    Obese    Retinal hemorrhage, both eyes    Sessile colonic polyp 02/04/2019   Colonoscopy February 2020.  History reviewed. No pertinent surgical history.  FAMILY HISTORY Family History  Problem Relation Age of Onset   Arthritis Mother    Hyperlipidemia Mother    Hypertension Mother    Cancer Father 42       Prostate (Metastatic) Lung, Esophagus, Stomach   Alcohol abuse Father    Kidney disease Brother    Hypertension Brother    Cancer Paternal Aunt        Breast Cancer   Alzheimer's disease Maternal Grandmother    Heart disease Paternal Grandmother    Diabetes Brother    Alcohol abuse Maternal Uncle    SOCIAL HISTORY Social History   Tobacco Use   Smoking status: Never   Smokeless  tobacco: Never  Vaping Use   Vaping Use: Never used  Substance Use Topics   Alcohol use: Yes    Comment: Rarely   Drug use: No       OPHTHALMIC EXAM: Base Eye Exam     Visual Acuity (Snellen - Linear)       Right Left   Dist cc 20/20 20/20 -1    Correction: Glasses         Tonometry (Tonopen, 9:56 AM)       Right Left   Pressure 16 16         Pupils       Dark Light Shape React APD   Right 3 2 Round Brisk None   Left 3 2 Round Brisk None         Visual Fields (Counting fingers)       Left Right    Full Full         Extraocular Movement       Right Left    Full, Ortho Full, Ortho         Neuro/Psych     Oriented x3: Yes   Mood/Affect: Normal         Dilation     Both eyes: 1.0% Mydriacyl, 2.5% Phenylephrine @ 9:56 AM           Slit Lamp and Fundus Exam     Slit Lamp Exam       Right Left   Lids/Lashes Dermatochalasis - upper lid Dermatochalasis - upper lid   Conjunctiva/Sclera nasal pingeucula nasal and temporal pinguecula   Cornea trace PEE trace PEE   Anterior Chamber Deep and quiet Deep and quiet   Iris Round and dilated, No NVI Round and dilated, No NVI   Lens 2+ Nuclear sclerosis, 2+ Cortical cataract 2+ Nuclear sclerosis, 2+ Cortical cataract   Anterior Vitreous Mild Vitreous syneresis Mild Vitreous syneresis         Fundus Exam       Right Left   Disc Pink and Sharp, PPA, Compact mild tilt, Pink and Sharp, PPA   C/D Ratio 0.2 0.2   Macula good foveal reflex, trace MA, no exudates, trace perifoveal cystic changes -- improved, focal blot heme superior macula Good foveal reflex, Microaneurysms -- slightly improved, trace Cystic changes temporal macula -- improved   Vessels ?NV temporal periphery-- regressing, mild attenuation, mild tortuosity trace focal fibrosis along distal IT arcade, ?NV temporal periphery, attenuated, Tortuous   Periphery Attached, 360 PRP with good laser fill in, pre-retinal heme temporal  periphery improved, scattered macroaneurysms / DBH- greatest posteriorly Attached, 360 PRP with good fill in, scattered IRH/DBH - improved           Refraction     Wearing Rx  Sphere Cylinder Axis   Right -2.50 +1.00 138   Left -2.25 +1.00 173           IMAGING AND PROCEDURES  Imaging and Procedures for 03/16/2023  OCT, Retina - OU - Both Eyes       Right Eye Quality was good. Central Foveal Thickness: 355. Progression has improved. Findings include normal foveal contour, no IRF, no SRF, intraretinal fluid, vitreomacular adhesion (interval improvement in IRF -- just trace persistent cystic changes parafovea, partial PVD).   Left Eye Quality was good. Central Foveal Thickness: 354. Progression has improved. Findings include normal foveal contour, no IRF, no SRF, vitreomacular adhesion (Interval improvement in cystic changes temporal mac -- just trace cystic changes remain).   Notes *Images captured and stored on drive  Diagnosis / Impression:  +DME OU OD: interval improvement in IRF -- just trace persistent cystic changes parafovea, partial PVD OS: interval improvement in IRF temporal mac -- just trace cystic changes remain  Clinical management:  See below  Abbreviations: NFP - Normal foveal profile. CME - cystoid macular edema. PED - pigment epithelial detachment. IRF - intraretinal fluid. SRF - subretinal fluid. EZ - ellipsoid zone. ERM - epiretinal membrane. ORA - outer retinal atrophy. ORT - outer retinal tubulation. SRHM - subretinal hyper-reflective material. IRHM - intraretinal hyper-reflective material      Intravitreal Injection, Pharmacologic Agent - OD - Right Eye       Time Out 03/16/2023. 10:38 AM. Confirmed correct patient, procedure, site, and patient consented.   Anesthesia Topical anesthesia was used. Anesthetic medications included Lidocaine 2%, Proparacaine 0.5%.   Procedure Preparation included 5% betadine to ocular surface, eyelid  speculum. A (32g) needle was used.   Injection: 1.25 mg Bevacizumab 1.25mg /0.31ml   Route: Intravitreal, Site: Right Eye   NDC: P3213405, Lot: [id number], Expiration date: 06/09/2023   Post-op Post injection exam found visual acuity of at least counting fingers. The patient tolerated the procedure well. There were no complications. The patient received written and verbal post procedure care education. Post injection medications were not given.      Intravitreal Injection, Pharmacologic Agent - OS - Left Eye       Time Out 03/16/2023. 10:39 AM. Confirmed correct patient, procedure, site, and patient consented.   Anesthesia Topical anesthesia was used. Anesthetic medications included Lidocaine 2%, Proparacaine 0.5%.   Procedure Preparation included 5% betadine to ocular surface, eyelid speculum. A (32 g) needle was used.   Injection: 1.25 mg Bevacizumab 1.25mg /0.65ml   Route: Intravitreal, Site: Left Eye   NDC: P3213405, Lot: [id number], Expiration date: 06/21/2023   Post-op Post injection exam found visual acuity of at least counting fingers. The patient tolerated the procedure well. There were no complications. The patient received written and verbal post procedure care education. Post injection medications were not given.             ASSESSMENT/PLAN:   ICD-10-CM   1. Proliferative diabetic retinopathy of both eyes with macular edema associated with type 2 diabetes mellitus  E11.3513 OCT, Retina - OU - Both Eyes    Intravitreal Injection, Pharmacologic Agent - OD - Right Eye    Intravitreal Injection, Pharmacologic Agent - OS - Left Eye    Bevacizumab (AVASTIN) SOLN 1.25 mg    Bevacizumab (AVASTIN) SOLN 1.25 mg    2. Essential hypertension  I10     3. Hypertensive retinopathy of both eyes  H35.033     4. Combined forms of age-related cataract of both eyes  H25.813  1. 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, OU (OD > OS)  - last A1c 7.1 on 04.21.23 - pt  with history of PRP laser OU in [place] ~10+ yrs ago -- pt can't remember provider - exam shows scattered MA/IRH and 360 PRP, room for fill in OU - FA (9.19.22) shows late-leaking MA, vascular nonperfusion, +NV OU -- will need PRP fill in OU - s/p PRP OD (09.29.22) - s/p PRP OS (11.14.22) - s/p IVA OD #1 (09.19.22) - s/p IVA OS #1 (09.23.22) - s/p IVA OU #2 (10.21.22), #3 (12.23.22), #4 (01.06.23), #5 (02.03.23), #6 (03.03.23), #7 (04.07.23), #8 (05.19.23), #9 (07.14.23), #10 (09.01.23), #11 (10.20.23) #12 (12.08.23), #13 (02.09.24) **interval worsening of IRF at 8 weeks noted on 07.14.23 exam** - BCVA 20/20 OU - OCT shows OD: interval improvement in IRF -- just trace persistent cystic changes parafovea, partial PVD; OS: interval improvement in IRF temporal mac -- just trace cystic changes remain at 10 weeks - recommend IVA OU #14 today, 04.19.24 for DME with f/u ext to 12 weeks - pt wishes to proceed  - RBA of procedure discussed, questions answered - informed consent obtained and signed - see procedure notes - f/u 12 weeks DFE/OCT, possible injection(s)  2,3. Hypertensive retinopathy OU - discussed importance of tight BP control -  monitor for now  4. Mixed Cataract OU - The symptoms of cataract, surgical options, and treatments and risks were discussed with patient. - discussed diagnosis and progression - not yet visually significant - monitor for now  Ophthalmic Meds Ordered this visit:  Meds ordered this encounter  Medications   Bevacizumab (AVASTIN) SOLN 1.25 mg   Bevacizumab (AVASTIN) SOLN 1.25 mg     Return in about 12 weeks (around 06/08/2023) for f/u PDR OU, DFE, OCT, Possible, IVA, OU.  There are no Patient Instructions on file for this visit.   This document serves as a record of services personally performed by Karie Chimera, MD, PhD. It was created on their behalf by Gerilyn Nestle, COT an ophthalmic technician. The creation of this record is the  provider's dictation and/or activities during the visit.    Electronically signed by:  Gerilyn Nestle, COT  04.19.24 11:51 AM   Karie Chimera, M.D., Ph.D. Diseases & Surgery of the Retina and Vitreous [facility]  I have reviewed the above documentation for accuracy and completeness, and I agree with the above. Karie Chimera, M.D., Ph.D. 03/16/23 11:51 AM   Abbreviations: M myopia (nearsighted); A astigmatism; H hyperopia (farsighted); P presbyopia; Mrx spectacle prescription;  CTL contact lenses; OD right eye; OS left eye; OU both eyes  XT exotropia; ET esotropia; PEK punctate epithelial keratitis; PEE punctate epithelial erosions; DES dry eye syndrome; MGD meibomian gland dysfunction; ATs artificial tears; PFAT's preservative free artificial tears; NSC nuclear sclerotic cataract; PSC posterior subcapsular cataract; ERM epi-retinal membrane; PVD posterior vitreous detachment; RD retinal detachment; DM diabetes mellitus; DR diabetic retinopathy; NPDR non-proliferative diabetic retinopathy; PDR proliferative diabetic retinopathy; CSME clinically significant macular edema; DME diabetic macular edema; dbh dot blot hemorrhages; CWS cotton wool spot; POAG primary open angle glaucoma; C/D cup-to-disc ratio; HVF humphrey visual field; GVF goldmann visual field; OCT optical coherence tomography; IOP intraocular pressure; BRVO Branch retinal vein occlusion; CRVO central retinal vein occlusion; CRAO central retinal artery occlusion; BRAO branch retinal artery occlusion; RT retinal tear; SB scleral buckle; PPV pars plana vitrectomy; VH Vitreous hemorrhage; PRP panretinal laser photocoagulation; IVK intravitreal kenalog; VMT vitreomacular traction; MH Macular hole;  NVD neovascularization of the disc; NVE neovascularization elsewhere; AREDS age related eye disease study; ARMD age related macular degeneration; POAG primary open angle glaucoma; EBMD epithelial/anterior basement membrane  dystrophy; ACIOL anterior chamber intraocular lens; IOL intraocular lens; PCIOL posterior chamber intraocular lens; Phaco/IOL phacoemulsification with intraocular lens placement; PRK photorefractive keratectomy; LASIK laser assisted in situ keratomileusis; HTN hypertension; DM diabetes mellitus; COPD chronic obstructive pulmonary disease

## 2023-03-22 ENCOUNTER — Ambulatory Visit (INDEPENDENT_AMBULATORY_CARE_PROVIDER_SITE_OTHER): Payer: No Typology Code available for payment source | Admitting: Family Medicine

## 2023-03-22 ENCOUNTER — Encounter: Payer: Self-pay | Admitting: Family Medicine

## 2023-03-22 VITALS — BP 145/67 | HR 62 | Ht 68.0 in | Wt 227.0 lb

## 2023-03-22 DIAGNOSIS — E782 Mixed hyperlipidemia: Secondary | ICD-10-CM

## 2023-03-22 DIAGNOSIS — E1121 Type 2 diabetes mellitus with diabetic nephropathy: Secondary | ICD-10-CM

## 2023-03-22 DIAGNOSIS — E1159 Type 2 diabetes mellitus with other circulatory complications: Secondary | ICD-10-CM | POA: Diagnosis not present

## 2023-03-22 DIAGNOSIS — Z794 Long term (current) use of insulin: Secondary | ICD-10-CM

## 2023-03-22 DIAGNOSIS — I152 Hypertension secondary to endocrine disorders: Secondary | ICD-10-CM | POA: Diagnosis not present

## 2023-03-22 LAB — POCT GLYCOSYLATED HEMOGLOBIN (HGB A1C): HbA1c, POC (controlled diabetic range): 6.7 % (ref 0.0–7.0)

## 2023-03-22 MED ORDER — SILDENAFIL CITRATE 100 MG PO TABS: 50.0000 mg | ORAL_TABLET | Freq: Every day | ORAL | 11 refills | Status: DC | PRN

## 2023-03-22 NOTE — Patient Instructions (Signed)
Check BP at home over the next couple of weeks.  Try sildenafil to replace to tadalafil, let me know if this works any better for you.  I will plan to see you again in 6 months.

## 2023-03-22 NOTE — Assessment & Plan Note (Signed)
BP is a little elevated today.  He will check at home over the next couple of weeks. Will adjust medication if this remains elevated.

## 2023-03-22 NOTE — Progress Notes (Signed)
Edward Riley - 57 y.o. male MRN [id number]  Date of birth: Oct 05, 1966  Subjective Chief Complaint  Patient presents with   Diabetes   Hypertension    HPI Edward Riley is a 57 y.o. male here today for follow up visit.   He reports that he is doing pretty well.  Continues on [place], actos and xigduo for management of glucose.  He is doing well with this.  He denies side effects from medication.  He  He has had . Continues to see retina specialist for associated retinopathy.  Tolerating crestor well at current strength as well.   Continues on lisinopril, hctz and amlodipine for management of HTN.  BP elevated initially.  He is taking this regularly.  Denies chest pain, shortness of breath, palpitations, headache or vision changes.    ROS:  A comprehensive ROS was completed and negative except as noted per HPI  No Known Allergies  Past Medical History:  Diagnosis Date   Diabetes mellitus without complication    Hyperlipidemia    Hypertension    Obese    Retinal hemorrhage, both eyes    Sessile colonic polyp 02/04/2019   Colonoscopy February 2020.    History reviewed. No pertinent surgical history.  Social History   Socioeconomic History   Marital status: Single    Spouse name: Not on file   Number of children: Not on file   Years of education: 12   Highest education level: Not on file  Occupational History   Occupation: TRUCK DRIVER     Employer: other - Physiological scientist    Comment: [employer]  Tobacco Use   Smoking status: Never   Smokeless tobacco: Never  Vaping Use   Vaping Use: Never used  Substance and Sexual Activity   Alcohol use: Yes    Comment: Rarely   Drug use: No   Sexual activity: Yes  Other Topics Concern   Not on file  Social History Narrative   Marital Status: Single    Children: None   Pets: None    Living Situation: Lives alone   Occupation: Truck Sports administrator)     Education:  12 th Grade    Tobacco Use/Exposure:   None    Alcohol Use:  Rarely   Drug Use:  None   Diet:  Regular   Exercise:  Very Active at work    Hobbies:  Teacher, early years/pre                Social Determinants of Corporate investment banker Strain: Not on file  Food Insecurity: Not on file  Transportation Needs: Not on file  Physical Activity: Not on file  Stress: Not on file  Social Connections: Not on file    Family History  Problem Relation Age of Onset   Arthritis Mother    Hyperlipidemia Mother    Hypertension Mother    Cancer Father 15       Prostate (Metastatic) Lung, Esophagus, Stomach   Alcohol abuse Father    Kidney disease Brother    Hypertension Brother    Cancer Paternal Aunt        Breast Cancer   Alzheimer's disease Maternal Grandmother    Heart disease Paternal Grandmother    Diabetes Brother    Alcohol abuse Maternal Uncle     Health Maintenance  Topic Date Due   Zoster Vaccines- Shingrix (2 of 2) 06/21/2023 (Originally 08/29/2021)   COVID-19 Vaccine (2 - 2023-24 season) 09/07/2023 (Originally  07/28/2022)   Hepatitis C Screening  09/22/2023 (Originally 04/04/1986)   HIV Screening  09/22/2023 (Originally 04/05/1983)   INFLUENZA VACCINE  06/28/2023   OPHTHALMOLOGY EXAM  09/16/2023   HEMOGLOBIN A1C  09/21/2023   Diabetic kidney evaluation - eGFR measurement  09/22/2023   Diabetic kidney evaluation - Urine ACR  09/22/2023   FOOT EXAM  09/22/2023   DTaP/Tdap/Td (2 - Td or Tdap) 11/17/2026   COLONOSCOPY (Pts 45-55yrs Insurance coverage will need to be confirmed)  01/24/2029   HPV VACCINES  Aged Out     ----------------------------------------------------------------------------------------------------------------------------------------------------------------------------------------------------------------- Physical Exam BP (!) 148/71 (BP Location: Right Arm, Patient Position: Sitting, Cuff Size: Large)   Pulse 62   Ht  (1.727 m)   Wt 227 lb (103 kg)   SpO2 100%   BMI 34.52 kg/m    Physical Exam Constitutional:      Appearance: Normal appearance.  HENT:     Head: Normocephalic and atraumatic.  Eyes:     General: No scleral icterus. Cardiovascular:     Rate and Rhythm: Normal rate and regular rhythm.  Pulmonary:     Effort: Pulmonary effort is normal.     Breath sounds: Normal breath sounds.  Neurological:     General: No focal deficit present.     Mental Status: He is alert.  Psychiatric:        Mood and Affect: Mood normal.        Behavior: Behavior normal.     ------------------------------------------------------------------------------------------------------------------------------------------------------------------------------------------------------------------- Assessment and Plan  Type 2 diabetes mellitus with diabetic nephropathy Blood sugars are well controlled.  Continue mounjaro with titration as directed.    Hypertension associated with diabetes (HCC) BP is a little elevated today.  He will check at home over the next couple of weeks. Will adjust medication if this remains elevated.   Hyperlipidemia Continue crestor at current strength.     Meds ordered this encounter  Medications   sildenafil (VIAGRA) 100 MG tablet    Sig: Take 0.5-1 tablets (50-100 mg total) by mouth daily as needed for erectile dysfunction.    Dispense:  20 tablet    Refill:  11    No follow-ups on file.    This visit occurred during the SARS-CoV-2 public health emergency.  Safety protocols were in place, including screening questions prior to the visit, additional usage of staff PPE, and extensive cleaning of exam room while observing appropriate contact time as indicated for disinfecting solutions.

## 2023-03-22 NOTE — Assessment & Plan Note (Signed)
Continue crestor at current strength  

## 2023-03-22 NOTE — Assessment & Plan Note (Addendum)
Blood sugars are well controlled.  Continue mounjaro with titration as directed.

## 2023-03-23 ENCOUNTER — Ambulatory Visit: Payer: No Typology Code available for payment source | Admitting: Family Medicine

## 2023-03-27 ENCOUNTER — Telehealth: Payer: Self-pay | Admitting: Family Medicine

## 2023-03-27 NOTE — Telephone Encounter (Signed)
PT states he has a brother from another mother and wants to know if Dr.Matthews will take him as a new patient? He requested Dr.Matthews to give him a call when he has time.

## 2023-04-14 ENCOUNTER — Other Ambulatory Visit: Payer: Self-pay | Admitting: Family Medicine

## 2023-05-04 ENCOUNTER — Other Ambulatory Visit: Payer: Self-pay | Admitting: Family Medicine

## 2023-05-14 ENCOUNTER — Encounter: Payer: Self-pay | Admitting: Family Medicine

## 2023-05-14 ENCOUNTER — Other Ambulatory Visit: Payer: Self-pay | Admitting: Family Medicine

## 2023-05-14 NOTE — Telephone Encounter (Signed)
This medication or product was previously approved on [id number] from 2022-10-04 to 2023-10-05.

## 2023-05-14 NOTE — Telephone Encounter (Signed)
Can we check [facility] and see if they may have this in stock?

## 2023-06-13 ENCOUNTER — Other Ambulatory Visit: Payer: Self-pay | Admitting: Family Medicine

## 2023-06-22 ENCOUNTER — Encounter (INDEPENDENT_AMBULATORY_CARE_PROVIDER_SITE_OTHER): Payer: No Typology Code available for payment source | Admitting: Ophthalmology

## 2023-06-22 DIAGNOSIS — E113513 Type 2 diabetes mellitus with proliferative diabetic retinopathy with macular edema, bilateral: Secondary | ICD-10-CM

## 2023-06-22 DIAGNOSIS — H25813 Combined forms of age-related cataract, bilateral: Secondary | ICD-10-CM

## 2023-06-22 DIAGNOSIS — I10 Essential (primary) hypertension: Secondary | ICD-10-CM

## 2023-06-22 DIAGNOSIS — H35033 Hypertensive retinopathy, bilateral: Secondary | ICD-10-CM

## 2023-06-29 ENCOUNTER — Encounter (INDEPENDENT_AMBULATORY_CARE_PROVIDER_SITE_OTHER): Payer: No Typology Code available for payment source | Admitting: Ophthalmology

## 2023-06-29 DIAGNOSIS — H25813 Combined forms of age-related cataract, bilateral: Secondary | ICD-10-CM

## 2023-06-29 DIAGNOSIS — H35033 Hypertensive retinopathy, bilateral: Secondary | ICD-10-CM

## 2023-06-29 DIAGNOSIS — I10 Essential (primary) hypertension: Secondary | ICD-10-CM

## 2023-06-29 DIAGNOSIS — E113513 Type 2 diabetes mellitus with proliferative diabetic retinopathy with macular edema, bilateral: Secondary | ICD-10-CM

## 2023-07-04 ENCOUNTER — Encounter (INDEPENDENT_AMBULATORY_CARE_PROVIDER_SITE_OTHER): Payer: Self-pay | Admitting: Ophthalmology

## 2023-07-04 ENCOUNTER — Ambulatory Visit (INDEPENDENT_AMBULATORY_CARE_PROVIDER_SITE_OTHER): Payer: No Typology Code available for payment source | Admitting: Ophthalmology

## 2023-07-04 DIAGNOSIS — E113513 Type 2 diabetes mellitus with proliferative diabetic retinopathy with macular edema, bilateral: Secondary | ICD-10-CM

## 2023-07-04 DIAGNOSIS — H35033 Hypertensive retinopathy, bilateral: Secondary | ICD-10-CM | POA: Diagnosis not present

## 2023-07-04 DIAGNOSIS — I10 Essential (primary) hypertension: Secondary | ICD-10-CM | POA: Diagnosis not present

## 2023-07-04 DIAGNOSIS — H25813 Combined forms of age-related cataract, bilateral: Secondary | ICD-10-CM | POA: Diagnosis not present

## 2023-07-04 MED ORDER — BEVACIZUMAB CHEMO INJECTION 1.25MG/0.05ML SYRINGE FOR KALEIDOSCOPE
1.2500 mg | INTRAVITREAL | Status: AC | PRN
Start: 2023-07-04 — End: 2023-07-04
  Administered 2023-07-04: 1.25 mg via INTRAVITREAL

## 2023-07-04 NOTE — Progress Notes (Signed)
[facility] - Clinic Note  07/04/2023     CHIEF COMPLAINT Patient presents for Retina Follow Up   HISTORY OF PRESENT ILLNESS: Edward Riley is a 57 y.o. male who presents to the clinic today for:   HPI     Retina Follow Up   Patient presents with  Diabetic Retinopathy.  In both eyes.  This started 12 weeks ago.  Duration of 12 weeks.  Since onset it is stable.  I, the attending physician,  performed the HPI with the patient and updated documentation appropriately.        Comments   12 week retina follow up PDR ou and possible IVA OU pt is reporting no vision changes noticed he denies any flashes or floaters he is not currently checking his blood sugar at this time       Last edited by Rennis Chris, MD on 07/04/2023  5:41 PM.    Patient feels that vision is down a little. He is delayed to follow up due to work schedule -- 15.5 wks instead of 12  Referring physician: Everrett Coombe, DO [address] [facility] [address] [place],  [place] [address]  HISTORICAL INFORMATION:   Selected notes from the MEDICAL RECORD NUMBER Referred by Dr. Everrett Coombe for diabetic eye exam LEE: [facility] Ocular Hx- PMH-    CURRENT MEDICATIONS: No current outpatient medications on file. (Ophthalmic Drugs)   No current facility-administered medications for this visit. (Ophthalmic Drugs)   Current Outpatient Medications (Other)  Medication Sig   AMBULATORY NON FORMULARY MEDICATION Bayer test strips and lancets. Use to check blood sugar twice a day. Dx: Type Two Diabetes Mellitus   amLODipine (NORVASC) 10 MG tablet TAKE 1 TABLET BY MOUTH DAILY   BD PEN NEEDLE NANO U/F 32G X 4 MM MISC USE AS DIRECTED   Blood Glucose Monitoring Suppl (ONE TOUCH ULTRA 2) w/Device KIT Use to check blood sugar twice a day. Send strips and lancets, quantity 100 refills 3.  Dx: Type Two Diabetes Mellitus   glucose blood (ONE TOUCH ULTRA TEST) test strip Use as instructed    hydrochlorothiazide (HYDRODIURIL) 25 MG tablet TAKE ONE TABLET BY MOUTH DAILY   Insulin Pen Needle (PEN NEEDLES) 31G X 8 MM MISC Use the pen needles for both the insulin and victoza pens daily   Lancet Device MISC Check blood sugar once or twice daily as needed.   lisinopril (ZESTRIL) 20 MG tablet TAKE ONE TABLET BY MOUTH EVERY MORNING   pioglitazone (ACTOS) 45 MG tablet TAKE ONE TABLET BY MOUTH DAILY   rosuvastatin (CRESTOR) 20 MG tablet TAKE ONE TABLET BY MOUTH DAILY   sildenafil (VIAGRA) 100 MG tablet Take 0.5-1 tablets (50-100 mg total) by mouth daily as needed for erectile dysfunction.   tirzepatide (MOUNJARO) 10 MG/0.5ML Pen INJECT 10 MG UNDER THE SKIN ONCE WEEKLY   tirzepatide (MOUNJARO) 15 MG/0.5ML Pen Inject 15 mg into the skin once a week. (Patient not taking: Reported on 03/22/2023)   XIGDUO XR 08-999 MG TB24 TAKE 1 TABLET BY MOUTH DAILY   No current facility-administered medications for this visit. (Other)   REVIEW OF SYSTEMS: ROS   Positive for: Endocrine, Cardiovascular, Eyes Negative for: Constitutional, Gastrointestinal, Neurological, Skin, Genitourinary, Musculoskeletal, HENT, Respiratory, Psychiatric, Allergic/Imm, Heme/Lymph Last edited by Etheleen Mayhew, COT on 07/04/2023  1:57 PM.      ALLERGIES No Known Allergies  PAST MEDICAL HISTORY Past Medical History:  Diagnosis Date   Diabetes mellitus without  complication (HCC)    Hyperlipidemia    Hypertension    Obese    Retinal hemorrhage, both eyes    Sessile colonic polyp 02/04/2019   Colonoscopy February 2020.   History reviewed. No pertinent surgical history.  FAMILY HISTORY Family History  Problem Relation Age of Onset   Arthritis Mother    Hyperlipidemia Mother    Hypertension Mother    Cancer Father 66       Prostate (Metastatic) Lung, Esophagus, Stomach   Alcohol abuse Father    Kidney disease Brother    Hypertension Brother    Cancer Paternal Aunt        Breast Cancer   Alzheimer's  disease Maternal Grandmother    Heart disease Paternal Grandmother    Diabetes Brother    Alcohol abuse Maternal Uncle    SOCIAL HISTORY Social History   Tobacco Use   Smoking status: Never   Smokeless tobacco: Never  Vaping Use   Vaping status: Never Used  Substance Use Topics   Alcohol use: Yes    Comment: Rarely   Drug use: No       OPHTHALMIC EXAM: Base Eye Exam     Visual Acuity (Snellen - Linear)       Right Left   Dist cc 20/30 20/25 -3   Dist ph cc 20/25 NI    Correction: Glasses         Tonometry (Tonopen, 2:02 PM)       Right Left   Pressure 16 18         Pupils       Pupils Dark Light Shape React APD   Right PERRL 3 2 Round Brisk None   Left PERRL 3 2 Round Brisk None         Visual Fields       Left Right    Full Full         Extraocular Movement       Right Left    Full, Ortho Full, Ortho         Neuro/Psych     Oriented x3: Yes   Mood/Affect: Normal         Dilation     Both eyes: 2.5% Phenylephrine @ 2:02 PM           Slit Lamp and Fundus Exam     Slit Lamp Exam       Right Left   Lids/Lashes Dermatochalasis - upper lid Dermatochalasis - upper lid   Conjunctiva/Sclera nasal pingeucula nasal and temporal pinguecula   Cornea trace PEE trace PEE   Anterior Chamber Deep and quiet Deep and quiet   Iris Round and dilated, No NVI Round and dilated, No NVI   Lens 2+ Nuclear sclerosis, 2+ Cortical cataract 2+ Nuclear sclerosis, 2+ Cortical cataract   Anterior Vitreous Mild Vitreous syneresis Mild Vitreous syneresis         Fundus Exam       Right Left   Disc Pink and Sharp, PPA, Compact mild tilt, Pink and Sharp, PPA   C/D Ratio 0.2 0.2   Macula good foveal reflex, trace MA, no exudates, trace perifoveal cystic changes, focal blot heme superior macula- improved Good foveal reflex, Microaneurysms -- slightly improved, trace Cystic changes temporal macula -- stably improved   Vessels ?NV temporal periphery--  regressing, mild attenuation, mild tortuosity trace focal fibrosis along distal IT arcade, attenuated, Tortuous   Periphery Attached, 360 PRP with good laser fill in, pre-retinal heme  temporal periphery improved, scattered macroaneurysms / DBH- greatest posteriorly Attached, 360 PRP with good fill in, scattered IRH/DBH - improved           Refraction     Wearing Rx       Sphere Cylinder Axis   Right -2.50 +1.00 138   Left -2.25 +1.00 173           IMAGING AND PROCEDURES  Imaging and Procedures for 07/04/2023  OCT, Retina - OU - Both Eyes       Right Eye Quality was good. Central Foveal Thickness: 364. Progression has worsened. Findings include normal foveal contour, no SRF, intraretinal fluid, vitreomacular adhesion (interval increase in IRF/IRHM -- nasal macule, partial PVD).   Left Eye Quality was good. Central Foveal Thickness: 354. Progression has been stable. Findings include normal foveal contour, no IRF, no SRF, vitreomacular adhesion (Trace cystic changes temporal mac).   Notes *Images captured and stored on drive  Diagnosis / Impression:  +DME OU OD: interval increase in IRF/IRHM -- nasal macule, partial PVD OS: Trace cystic changes temporal mac  Clinical management:  See below  Abbreviations: NFP - Normal foveal profile. CME - cystoid macular edema. PED - pigment epithelial detachment. IRF - intraretinal fluid. SRF - subretinal fluid. EZ - ellipsoid zone. ERM - epiretinal membrane. ORA - outer retinal atrophy. ORT - outer retinal tubulation. SRHM - subretinal hyper-reflective material. IRHM - intraretinal hyper-reflective material      Intravitreal Injection, Pharmacologic Agent - OD - Right Eye       Time Out 07/04/2023. 2:59 PM. Confirmed correct patient, procedure, site, and patient consented.   Anesthesia Topical anesthesia was used. Anesthetic medications included Lidocaine 2%, Proparacaine 0.5%.   Procedure Preparation included 5% betadine to  ocular surface, eyelid speculum. A supplied (32g) needle was used.   Injection: 1.25 mg Bevacizumab 1.25mg /0.17ml   Route: Intravitreal, Site: Right Eye   NDC: P3213405, Lot: [id number], Expiration date: 07/08/2023   Post-op Post injection exam found visual acuity of at least counting fingers. The patient tolerated the procedure well. There were no complications. The patient received written and verbal post procedure care education. Post injection medications were not given.      Intravitreal Injection, Pharmacologic Agent - OS - Left Eye       Time Out 07/04/2023. 2:59 PM. Confirmed correct patient, procedure, site, and patient consented.   Anesthesia Topical anesthesia was used. Anesthetic medications included Lidocaine 2%, Proparacaine 0.5%.   Procedure Preparation included 5% betadine to ocular surface, eyelid speculum. A (33g) needle was used.   Injection: 1.25 mg Bevacizumab 1.25mg /0.64ml   Route: Intravitreal, Site: Left Eye   NDC: P3213405, Lot: [id number], Expiration date: 03/19/2024   Post-op Post injection exam found visual acuity of at least counting fingers. The patient tolerated the procedure well. There were no complications. The patient received written and verbal post procedure care education. Post injection medications were not given.            ASSESSMENT/PLAN:   ICD-10-CM   1. Proliferative diabetic retinopathy of both eyes with macular edema associated with type 2 diabetes mellitus (HCC)  E11.3513 OCT, Retina - OU - Both Eyes    Intravitreal Injection, Pharmacologic Agent - OD - Right Eye    Intravitreal Injection, Pharmacologic Agent - OS - Left Eye    Bevacizumab (AVASTIN) SOLN 1.25 mg    Bevacizumab (AVASTIN) SOLN 1.25 mg    2. Essential hypertension  I10     3. Hypertensive retinopathy of  both eyes  H35.033     4. Combined forms of age-related cataract of both eyes  H25.813      1. 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, OU (OD > OS)  -  delayed f/u -- 15.5 wks instead of 12 due to work schedule  - last A1c  6.7 on 03/22/23, 7.1 on 04.21.23 - pt with history of PRP laser OU in [place] ~10+ yrs ago -- pt can't remember provider - exam shows scattered MA/IRH and 360 PRP, room for fill in OU - FA (9.19.22) shows late-leaking MA, vascular nonperfusion, +NV OU -- will need PRP fill in OU - s/p PRP OD (09.29.22) - s/p PRP OS (11.14.22) - s/p IVA OD #1 (09.19.22) - s/p IVA OS #1 (09.23.22) - s/p IVA OU #2 (10.21.22), #3 (12.23.22), #4 (01.06.23), #5 (02.03.23), #6 (03.03.23), #7 (04.07.23), #8 (05.19.23), #9 (07.14.23), #10 (09.01.23), #11 (10.20.23) #12 (12.08.23), #13 (02.09.24), 14 (04.19.24) **interval worsening of IRF at 8 weeks noted on 07.14.23 exam** - BCVA 20/25 OU -- dec from 20/20 OU - OCT shows OD: interval increase in IRF/IRHM -- nasal macula, partial PVD; ZO:XWRUE cystic changes temporal mac at 15.5 wks - recommend IVA OU #15 today, 08.07.24 for DME with f/u back to 12 weeks - pt wishes to proceed  - RBA of procedure discussed, questions answered - informed consent obtained and signed  - see procedure notes - f/u 12 weeks DFE/OCT, possible injection(s)  2,3. Hypertensive retinopathy OU - discussed importance of tight BP control -  monitor for now  4. Mixed Cataract OU - The symptoms of cataract, surgical options, and treatments and risks were discussed with patient. - discussed diagnosis and progression - not yet visually significant - monitor for now  Ophthalmic Meds Ordered this visit:  Meds ordered this encounter  Medications   Bevacizumab (AVASTIN) SOLN 1.25 mg   Bevacizumab (AVASTIN) SOLN 1.25 mg     Return in about 12 weeks (around 09/26/2023) for f/u PDR OU, DFE, OCT, Possible, IVA, OU.  There are no Patient Instructions on file for this visit.   This document serves as a record of services personally performed by Karie Chimera, MD, PhD. It was created on their behalf by Glee Arvin. Manson Passey,  OA an ophthalmic technician. The creation of this record is the provider's dictation and/or activities during the visit.    Electronically signed by: Glee Arvin. Manson Passey, OA 07/04/23 5:44 PM  This document serves as a record of services personally performed by Karie Chimera, MD, PhD. It was created on their behalf by Gerilyn Nestle, COT an ophthalmic technician. The creation of this record is the provider's dictation and/or activities during the visit.    Electronically signed by:  Charlette Caffey, COT  07/04/23 5:44 PM  Karie Chimera, M.D., Ph.D. Diseases & Surgery of the Retina and Vitreous [facility] 07/04/2023   I have reviewed the above documentation for accuracy and completeness, and I agree with the above. Karie Chimera, M.D., Ph.D. 07/04/23 5:45 PM  Abbreviations: M myopia (nearsighted); A astigmatism; H hyperopia (farsighted); P presbyopia; Mrx spectacle prescription;  CTL contact lenses; OD right eye; OS left eye; OU both eyes  XT exotropia; ET esotropia; PEK punctate epithelial keratitis; PEE punctate epithelial erosions; DES dry eye syndrome; MGD meibomian gland dysfunction; ATs artificial tears; PFAT's preservative free artificial tears; NSC nuclear sclerotic cataract; PSC posterior subcapsular cataract; ERM epi-retinal membrane; PVD posterior vitreous detachment; RD retinal detachment; DM diabetes mellitus;  DR diabetic retinopathy; NPDR non-proliferative diabetic retinopathy; PDR proliferative diabetic retinopathy; CSME clinically significant macular edema; DME diabetic macular edema; dbh dot blot hemorrhages; CWS cotton wool spot; POAG primary open angle glaucoma; C/D cup-to-disc ratio; HVF humphrey visual field; GVF goldmann visual field; OCT optical coherence tomography; IOP intraocular pressure; BRVO Branch retinal vein occlusion; CRVO central retinal vein occlusion; CRAO central retinal artery occlusion; BRAO branch retinal artery occlusion; RT  retinal tear; SB scleral buckle; PPV pars plana vitrectomy; VH Vitreous hemorrhage; PRP panretinal laser photocoagulation; IVK intravitreal kenalog; VMT vitreomacular traction; MH Macular hole;  NVD neovascularization of the disc; NVE neovascularization elsewhere; AREDS age related eye disease study; ARMD age related macular degeneration; POAG primary open angle glaucoma; EBMD epithelial/anterior basement membrane dystrophy; ACIOL anterior chamber intraocular lens; IOL intraocular lens; PCIOL posterior chamber intraocular lens; Phaco/IOL phacoemulsification with intraocular lens placement; PRK photorefractive keratectomy; LASIK laser assisted in situ keratomileusis; HTN hypertension; DM diabetes mellitus; COPD chronic obstructive pulmonary disease

## 2023-07-06 ENCOUNTER — Encounter (INDEPENDENT_AMBULATORY_CARE_PROVIDER_SITE_OTHER): Payer: No Typology Code available for payment source | Admitting: Ophthalmology

## 2023-07-09 ENCOUNTER — Other Ambulatory Visit: Payer: Self-pay | Admitting: Family Medicine

## 2023-07-09 DIAGNOSIS — I10 Essential (primary) hypertension: Secondary | ICD-10-CM

## 2023-09-12 ENCOUNTER — Other Ambulatory Visit: Payer: Self-pay | Admitting: Family Medicine

## 2023-09-14 ENCOUNTER — Other Ambulatory Visit: Payer: Self-pay | Admitting: Family Medicine

## 2023-09-14 DIAGNOSIS — E119 Type 2 diabetes mellitus without complications: Secondary | ICD-10-CM

## 2023-09-16 ENCOUNTER — Other Ambulatory Visit: Payer: Self-pay | Admitting: Family Medicine

## 2023-09-16 DIAGNOSIS — I152 Hypertension secondary to endocrine disorders: Secondary | ICD-10-CM

## 2023-09-21 ENCOUNTER — Ambulatory Visit: Payer: No Typology Code available for payment source | Admitting: Family Medicine

## 2023-09-21 VITALS — BP 109/63 | HR 88 | Temp 97.8°F | Ht 68.0 in | Wt 219.0 lb

## 2023-09-21 DIAGNOSIS — N420 Calculus of prostate: Secondary | ICD-10-CM

## 2023-09-21 DIAGNOSIS — Z23 Encounter for immunization: Secondary | ICD-10-CM

## 2023-09-21 DIAGNOSIS — E559 Vitamin D deficiency, unspecified: Secondary | ICD-10-CM

## 2023-09-21 DIAGNOSIS — E113553 Type 2 diabetes mellitus with stable proliferative diabetic retinopathy, bilateral: Secondary | ICD-10-CM | POA: Diagnosis not present

## 2023-09-21 DIAGNOSIS — E1169 Type 2 diabetes mellitus with other specified complication: Secondary | ICD-10-CM

## 2023-09-21 DIAGNOSIS — E785 Hyperlipidemia, unspecified: Secondary | ICD-10-CM

## 2023-09-21 DIAGNOSIS — E1159 Type 2 diabetes mellitus with other circulatory complications: Secondary | ICD-10-CM | POA: Diagnosis not present

## 2023-09-21 DIAGNOSIS — Z125 Encounter for screening for malignant neoplasm of prostate: Secondary | ICD-10-CM

## 2023-09-21 DIAGNOSIS — I152 Hypertension secondary to endocrine disorders: Secondary | ICD-10-CM

## 2023-09-21 DIAGNOSIS — E1121 Type 2 diabetes mellitus with diabetic nephropathy: Secondary | ICD-10-CM | POA: Diagnosis not present

## 2023-09-21 DIAGNOSIS — Z794 Long term (current) use of insulin: Secondary | ICD-10-CM

## 2023-09-21 DIAGNOSIS — R5383 Other fatigue: Secondary | ICD-10-CM

## 2023-09-21 LAB — POCT GLYCOSYLATED HEMOGLOBIN (HGB A1C): Hemoglobin A1C: 6.4 % — AB (ref 4.0–5.6)

## 2023-09-21 NOTE — Assessment & Plan Note (Signed)
Followed by ophthalmology.   

## 2023-09-21 NOTE — Assessment & Plan Note (Signed)
Blood sugars are well controlled.  Continue mounjaro with titration as directed.

## 2023-09-21 NOTE — Assessment & Plan Note (Signed)
BP is well controlled Continue current medications

## 2023-09-21 NOTE — Progress Notes (Signed)
Edward Riley - 57 y.o. male MRN [id number]  Date of birth: Oct 11, 1966  Subjective Chief Complaint  Patient presents with   Medical Management of Chronic Issues    HPI Edward Riley is a 57 y.o. male here today for follow up visit.   He reports that he is doing well.   Remains on mounjaro and has been doing quite well with this.  Also on actos and xigduo.  He feels that he is doing well with medications.  Tolerating crestor well for associated HLD.    BP remains well controlled with lisinopril and hydrochlorothiazide.  No side effects noted with these.  Denies chest pain, shortness of breath, palpitations, headache or vision changes.   ROS:  A comprehensive ROS was completed and negative except as noted per HPI  No Known Allergies  Past Medical History:  Diagnosis Date   Diabetes mellitus without complication (HCC)    Hyperlipidemia    Hypertension    Obese    Retinal hemorrhage, both eyes    Sessile colonic polyp 02/04/2019   Colonoscopy February 2020.    No past surgical history on file.  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    Comment: [employer]  Tobacco Use   Smoking status: Never   Smokeless tobacco: Never  Vaping Use   Vaping status: Never Used  Substance and Sexual Activity   Alcohol use: Yes    Comment: Rarely   Drug use: No   Sexual activity: Yes  Other Topics Concern   Not on file  Social History Narrative   Marital Status: Single    Children: None   Pets: None    Living Situation: Lives alone   Occupation: Naval architect Research officer, trade union)     Education:  12 th Grade    Tobacco Use/Exposure:  None    Alcohol Use:  Rarely   Drug Use:  None   Diet:  Regular   Exercise:  Very Active at work     Hobbies:  Teacher, early years/pre                Social Determinants of Corporate investment banker Strain: Low Risk  (09/21/2023)   Overall Financial Resource Strain (CARDIA)    Difficulty of Paying Living Expenses: Not hard at all  Food Insecurity: No Food Insecurity (09/21/2023)   Hunger Vital Sign    Worried About Running Out of Food in the Last Year: Never true    Ran Out of Food in the Last Year: Never true  Transportation Needs: No Transportation Needs (09/21/2023)   PRAPARE - Administrator, Civil Service (Medical): No    Lack of Transportation (Non-Medical): No  Physical Activity: Insufficiently Active (09/21/2023)   Exercise Vital Sign    Days of Exercise per Week: 4 days    Minutes of Exercise per Session: 30 min  Stress: No Stress Concern Present (09/21/2023)   [employer] of Occupational Health - Occupational Stress Questionnaire    Feeling of Stress : Not at all  Social Connections: Socially Isolated (09/21/2023)   Social Connection and Isolation Panel [NHANES]    Frequency of Communication with Friends and Family: More than three times a  week    Frequency of Social Gatherings with Friends and Family: More than three times a week    Attends Religious Services: Never    Database administrator or Organizations: No    Attends Engineer, structural: Not on file    Marital Status: Never married    Family History  Problem Relation Age of Onset   Arthritis Mother    Hyperlipidemia Mother    Hypertension Mother    Cancer Father 37       Prostate (Metastatic) Lung, Esophagus, Stomach   Alcohol abuse Father    Kidney disease Brother    Hypertension Brother    Cancer Paternal Aunt        Breast Cancer   Alzheimer's disease Maternal Grandmother    Heart disease Paternal Grandmother    Diabetes Brother    Alcohol abuse Maternal Uncle     Health Maintenance  Topic Date Due   OPHTHALMOLOGY EXAM  09/16/2023   Diabetic kidney evaluation - eGFR  measurement  09/22/2023   Diabetic kidney evaluation - Urine ACR  09/22/2023   Hepatitis C Screening  09/22/2023 (Originally 04/04/1986)   HIV Screening  09/22/2023 (Originally 04/05/1983)   COVID-19 Vaccine (2 - 2023-24 season) 10/07/2023 (Originally 07/29/2023)   Zoster Vaccines- Shingrix (2 of 2) 12/22/2023 (Originally 08/29/2021)   INFLUENZA VACCINE  02/25/2024 (Originally 06/28/2023)   FOOT EXAM  09/22/2023   HEMOGLOBIN A1C  03/21/2024   DTaP/Tdap/Td (2 - Td or Tdap) 11/17/2026   Colonoscopy  01/24/2029   HPV VACCINES  Aged Out     ----------------------------------------------------------------------------------------------------------------------------------------------------------------------------------------------------------------- Physical Exam BP 109/63 (BP Location: Left Arm, Patient Position: Sitting, Cuff Size: Normal)   Pulse 88   Temp 97.8 F (36.6 C) (Oral)   Ht 5\' 8"  (1.727 m)   Wt 219 lb (99.3 kg)   SpO2 97%   BMI 33.30 kg/m   Physical Exam Constitutional:      Appearance: Normal appearance.  Eyes:     General: No scleral icterus. Cardiovascular:     Rate and Rhythm: Normal rate and regular rhythm.  Pulmonary:     Effort: Pulmonary effort is normal.     Breath sounds: Normal breath sounds.  Neurological:     Mental Status: He is alert.  Psychiatric:        Mood and Affect: Mood normal.        Behavior: Behavior normal.     ------------------------------------------------------------------------------------------------------------------------------------------------------------------------------------------------------------------- Assessment and Plan  Hypertension associated with diabetes (HCC) BP is well controlled.  Continue current medications.   Hyperlipidemia associated with type 2 diabetes mellitus (HCC) Doing well with rosuvastatin.  Recommend continuation at current strength.    Type 2 diabetes mellitus with diabetic nephropathy Blood sugars  are well controlled.  Continue mounjaro with titration as directed.    Diabetes with retinopathy (HCC) Followed by ophthalmology.   No orders of the defined types were placed in this encounter.   Return in about 6 months (around 03/21/2024) for Type 2 Diabetes, Hypertension.    This visit occurred during the SARS-CoV-2 public health emergency.  Safety protocols were in place, including screening questions prior to the visit, additional usage of staff PPE, and extensive cleaning of exam room while observing appropriate contact time as indicated for disinfecting solutions.

## 2023-09-21 NOTE — Assessment & Plan Note (Signed)
Doing well with rosuvastatin.  Recommend continuation at current strength.   ?

## 2023-09-21 NOTE — Addendum Note (Signed)
Addended by: Delfino Lovett on: 09/21/2023 11:38 AM   Modules accepted: Orders

## 2023-09-24 ENCOUNTER — Other Ambulatory Visit: Payer: Self-pay | Admitting: Family Medicine

## 2023-09-25 LAB — CBC WITH DIFFERENTIAL/PLATELET
Basophils Absolute: 0 10*3/uL (ref 0.0–0.2)
Basos: 0 %
EOS (ABSOLUTE): 0.1 10*3/uL (ref 0.0–0.4)
Eos: 1 %
Hematocrit: 43.8 % (ref 37.5–51.0)
Hemoglobin: 14.8 g/dL (ref 13.0–17.7)
Immature Grans (Abs): 0 10*3/uL (ref 0.0–0.1)
Immature Granulocytes: 0 %
Lymphocytes Absolute: 1.2 10*3/uL (ref 0.7–3.1)
Lymphs: 18 %
MCH: 31.6 pg (ref 26.6–33.0)
MCHC: 33.8 g/dL (ref 31.5–35.7)
MCV: 94 fL (ref 79–97)
Monocytes Absolute: 0.5 10*3/uL (ref 0.1–0.9)
Monocytes: 8 %
Neutrophils Absolute: 4.9 10*3/uL (ref 1.4–7.0)
Neutrophils: 73 %
Platelets: 230 10*3/uL (ref 150–450)
RBC: 4.68 x10E6/uL (ref 4.14–5.80)
RDW: 12.1 % (ref 11.6–15.4)
WBC: 6.8 10*3/uL (ref 3.4–10.8)

## 2023-09-25 LAB — TSH: TSH: 0.7 u[IU]/mL (ref 0.450–4.500)

## 2023-09-25 LAB — CMP14+EGFR
ALT: 20 IU/L (ref 0–44)
AST: 22 IU/L (ref 0–40)
Albumin: 4.9 g/dL (ref 3.8–4.9)
Alkaline Phosphatase: 57 [IU]/L (ref 44–121)
BUN/Creatinine Ratio: 28 — ABNORMAL HIGH (ref 9–20)
BUN: 35 mg/dL — ABNORMAL HIGH (ref 6–24)
Bilirubin Total: 0.4 mg/dL (ref 0.0–1.2)
CO2: 23 mmol/L (ref 20–29)
Calcium: 10.1 mg/dL (ref 8.7–10.2)
Chloride: 100 mmol/L (ref 96–106)
Creatinine, Ser: 1.24 mg/dL (ref 0.76–1.27)
Globulin, Total: 2.1 g/dL (ref 1.5–4.5)
Glucose: 97 mg/dL (ref 70–99)
Potassium: 4.6 mmol/L (ref 3.5–5.2)
Sodium: 137 mmol/L (ref 134–144)
Total Protein: 7 g/dL (ref 6.0–8.5)
eGFR: 69 mL/min/{1.73_m2} (ref >59)

## 2023-09-25 LAB — VITAMIN D 25 HYDROXY (VIT D DEFICIENCY, FRACTURES): Vit D, 25-Hydroxy: 39.2 ng/mL (ref 30.0–100.0)

## 2023-09-25 LAB — IRON,TIBC AND FERRITIN PANEL
Ferritin: 475 ng/mL — ABNORMAL HIGH (ref 30–400)
Iron Saturation: 27 % (ref 15–55)
Iron: 78 ug/dL (ref 38–169)
Total Iron Binding Capacity: 286 ug/dL (ref 250–450)
UIBC: 208 ug/dL (ref 111–343)

## 2023-09-25 LAB — LIPID PANEL WITH LDL/HDL RATIO
Cholesterol, Total: 147 mg/dL (ref 100–199)
HDL: 43 mg/dL (ref >39)
LDL Chol Calc (NIH): 85 mg/dL (ref 0–99)
LDL/HDL Ratio: 2 ratio (ref 0.0–3.6)
Triglycerides: 102 mg/dL (ref 0–149)
VLDL Cholesterol Cal: 19 mg/dL (ref 5–40)

## 2023-09-25 LAB — MICROALBUMIN / CREATININE URINE RATIO
Creatinine, Urine: 112.1 mg/dL
Microalb/Creat Ratio: 29 mg/g{creat} (ref 0–29)
Microalbumin, Urine: 32.7 ug/mL

## 2023-09-25 LAB — VITAMIN B12: Vitamin B-12: 975 pg/mL (ref 232–1245)

## 2023-09-25 LAB — ZINC: Zinc: 78 ug/dL (ref 44–115)

## 2023-09-25 LAB — PSA: Prostate Specific Ag, Serum: 0.4 ng/mL (ref 0.0–4.0)

## 2023-09-27 NOTE — Progress Notes (Signed)
[facility] - Clinic Note  09/28/2023     CHIEF COMPLAINT Patient presents for Retina Follow Up   HISTORY OF PRESENT ILLNESS: Edward Riley is a 57 y.o. male who presents to the clinic today for:   HPI     Retina Follow Up   Patient presents with  Diabetic Retinopathy.  In both eyes.  This started 12 weeks ago.  Duration of 12 weeks.  Since onset it is stable.  I, the attending physician,  performed the HPI with the patient and updated documentation appropriately.        Comments   12 week retina follow up PDR OU and IVA OU pt is reporting no vision changes noticed he denies any flashes has some floaters his last A1C 6.4 he is not checking his blood sugar       Last edited by Rennis Chris, MD on 09/28/2023 12:29 PM.    Patient states he scheduled a routine eye exam for next Friday, he just had a physical last week, his A1c was 6.4, blood pressure is doing well also  Referring physician: Everrett Coombe, DO [address]  [address] [place],  [place] [address]  HISTORICAL INFORMATION:   Selected notes from the MEDICAL RECORD NUMBER Referred by Dr. Everrett Coombe for diabetic eye exam LEE: [facility] Ocular Hx- PMH-    CURRENT MEDICATIONS: No current outpatient medications on file. (Ophthalmic Drugs)   No current facility-administered medications for this visit. (Ophthalmic Drugs)   Current Outpatient Medications (Other)  Medication Sig   AMBULATORY NON FORMULARY MEDICATION Bayer test strips and lancets. Use to check blood sugar twice a day. Dx: Type Two Diabetes Mellitus   amLODipine (NORVASC) 10 MG tablet TAKE 1 TABLET BY MOUTH DAILY   BD PEN NEEDLE NANO U/F 32G X 4 MM MISC USE AS DIRECTED   Blood Glucose Monitoring Suppl (ONE TOUCH ULTRA 2) w/Device KIT Use to check blood sugar twice a day. Send strips and lancets, quantity 100 refills 3.  Dx: Type Two Diabetes Mellitus   glucose blood (ONE TOUCH ULTRA TEST) test strip  Use as instructed   hydrochlorothiazide (HYDRODIURIL) 25 MG tablet TAKE 1 TABLET BY MOUTH DAILY   Insulin Pen Needle (PEN NEEDLES) 31G X 8 MM MISC Use the pen needles for both the insulin and victoza pens daily   Lancet Device MISC Check blood sugar once or twice daily as needed.   lisinopril (ZESTRIL) 20 MG tablet TAKE ONE TABLET BY MOUTH EVERY MORNING   pioglitazone (ACTOS) 45 MG tablet Take 1 tablet (45 mg total) by mouth daily. Needs appointment.   rosuvastatin (CRESTOR) 20 MG tablet TAKE ONE TABLET BY MOUTH DAILY   sildenafil (VIAGRA) 100 MG tablet Take 0.5-1 tablets (50-100 mg total) by mouth daily as needed for erectile dysfunction.   tirzepatide (MOUNJARO) 15 MG/0.5ML Pen INJECT 15 MG UNDER THE SKIN ONCE WEEKLY   XIGDUO XR 08-999 MG TB24 TAKE 1 TABLET BY MOUTH DAILY   No current facility-administered medications for this visit. (Other)   REVIEW OF SYSTEMS: ROS   Positive for: Endocrine, Cardiovascular, Eyes Negative for: Constitutional, Gastrointestinal, Neurological, Skin, Genitourinary, Musculoskeletal, HENT, Respiratory, Psychiatric, Allergic/Imm, Heme/Lymph Last edited by Etheleen Mayhew, COT on 09/28/2023 10:02 AM.     ALLERGIES No Known Allergies  PAST MEDICAL HISTORY Past Medical History:  Diagnosis Date   Diabetes mellitus without complication (HCC)    Hyperlipidemia    Hypertension    Obese  Retinal hemorrhage, both eyes    Sessile colonic polyp 02/04/2019   Colonoscopy February 2020.   History reviewed. No pertinent surgical history.  FAMILY HISTORY Family History  Problem Relation Age of Onset   Arthritis Mother    Hyperlipidemia Mother    Hypertension Mother    Cancer Father 36       Prostate (Metastatic) Lung, Esophagus, Stomach   Alcohol abuse Father    Kidney disease Brother    Hypertension Brother    Cancer Paternal Aunt        Breast Cancer   Alzheimer's disease Maternal Grandmother    Heart disease Paternal Grandmother    Diabetes  Brother    Alcohol abuse Maternal Uncle    SOCIAL HISTORY Social History   Tobacco Use   Smoking status: Never   Smokeless tobacco: Never  Vaping Use   Vaping status: Never Used  Substance Use Topics   Alcohol use: Yes    Comment: Rarely   Drug use: No       OPHTHALMIC EXAM: Base Eye Exam     Visual Acuity (Snellen - Linear)       Right Left   Dist cc 20/30 -2 20/25 -3   Dist ph cc NI NI         Tonometry (Tonopen, 10:09 AM)       Right Left   Pressure 16 18         Pupils       Pupils Dark Light Shape React APD   Right PERRL 3 2 Round Brisk None   Left PERRL 3 2 Round Brisk None         Visual Fields       Left Right    Full Full         Extraocular Movement       Right Left    Full, Ortho Full, Ortho         Neuro/Psych     Oriented x3: Yes   Mood/Affect: Normal         Dilation     Both eyes: 2.5% Phenylephrine @ 10:09 AM           Slit Lamp and Fundus Exam     Slit Lamp Exam       Right Left   Lids/Lashes Dermatochalasis - upper lid Dermatochalasis - upper lid   Conjunctiva/Sclera nasal pingeucula nasal and temporal pinguecula   Cornea trace PEE trace PEE   Anterior Chamber Deep and quiet Deep and quiet   Iris Round and dilated, No NVI Round and dilated, No NVI   Lens 2+ Nuclear sclerosis, 2+ Cortical cataract 2+ Nuclear sclerosis, 2+ Cortical cataract   Anterior Vitreous Mild Vitreous syneresis Mild Vitreous syneresis         Fundus Exam       Right Left   Disc Pink and Sharp, PPA, Compact mild tilt, Pink and Sharp, PPA   C/D Ratio 0.2 0.2   Macula good foveal reflex, trace MA, no exudates, trace perifoveal cystic changes -- improved Good foveal reflex, Microaneurysms -- slightly improved, trace cystic changes temporal macula -- stably improved   Vessels ?NV temporal periphery-- regressing, mild attenuation, mild tortuosity trace focal fibrosis along distal IT arcade, attenuated, Tortuous   Periphery Attached,  360 PRP with good laser fill in, pre-retinal heme temporal periphery improved, scattered macroaneurysms / DBH- greatest posteriorly Attached, 360 PRP with good fill in, scattered IRH/DBH - improved  Refraction     Wearing Rx       Sphere Cylinder Axis   Right -2.50 +1.00 138   Left -2.25 +1.00 173           IMAGING AND PROCEDURES  Imaging and Procedures for 09/28/2023  OCT, Retina - OU - Both Eyes       Right Eye Quality was good. Central Foveal Thickness: 355. Progression has improved. Findings include normal foveal contour, no SRF, intraretinal fluid, vitreomacular adhesion (interval improvement in IRF/IRHM -- nasal macula -- just trace cystic changes remain, partial PVD).   Left Eye Quality was good. Central Foveal Thickness: 355. Progression has improved. Findings include normal foveal contour, no IRF, no SRF, vitreomacular adhesion (Trace cystic changes temporal mac -- improved).   Notes *Images captured and stored on drive  Diagnosis / Impression:  +DME OU OD: interval improvement in IRF/IRHM -- nasal macula -- just trace cystic changes remain, partial PVD OS: Trace cystic changes temporal mac -- improved  Clinical management:  See below  Abbreviations: NFP - Normal foveal profile. CME - cystoid macular edema. PED - pigment epithelial detachment. IRF - intraretinal fluid. SRF - subretinal fluid. EZ - ellipsoid zone. ERM - epiretinal membrane. ORA - outer retinal atrophy. ORT - outer retinal tubulation. SRHM - subretinal hyper-reflective material. IRHM - intraretinal hyper-reflective material      Intravitreal Injection, Pharmacologic Agent - OS - Left Eye       Time Out 09/28/2023. 10:37 AM. Confirmed correct patient, procedure, site, and patient consented.   Anesthesia Topical anesthesia was used. Anesthetic medications included Lidocaine 2%, Proparacaine 0.5%.   Procedure Preparation included 5% betadine to ocular surface, eyelid speculum. A  (33g) needle was used.   Injection: 1.25 mg Bevacizumab 1.25mg /0.39ml   Route: Intravitreal, Site: Left Eye   NDC: P3213405, Lot: [id number], Expiration date: 11/25/2023   Post-op Post injection exam found visual acuity of at least counting fingers. The patient tolerated the procedure well. There were no complications. The patient received written and verbal post procedure care education. Post injection medications were not given.      Intravitreal Injection, Pharmacologic Agent - OD - Right Eye       Time Out 09/28/2023. 10:39 AM. Confirmed correct patient, procedure, site, and patient consented.   Anesthesia Topical anesthesia was used. Anesthetic medications included Lidocaine 2%, Proparacaine 0.5%.   Procedure Preparation included 5% betadine to ocular surface, eyelid speculum. A (32g) needle was used.   Injection: 1.25 mg Bevacizumab 1.25mg /0.25ml   Route: Intravitreal, Site: Right Eye   NDC: P3213405, Lot: [id number], Expiration date: 12/22/2023   Post-op Post injection exam found visual acuity of at least counting fingers. The patient tolerated the procedure well. There were no complications. The patient received written and verbal post procedure care education. Post injection medications were not given.            ASSESSMENT/PLAN:   ICD-10-CM   1. Proliferative diabetic retinopathy of both eyes with macular edema associated with type 2 diabetes mellitus (HCC)  E11.3513 OCT, Retina - OU - Both Eyes    Intravitreal Injection, Pharmacologic Agent - OS - Left Eye    Intravitreal Injection, Pharmacologic Agent - OD - Right Eye    Bevacizumab (AVASTIN) SOLN 1.25 mg    Bevacizumab (AVASTIN) SOLN 1.25 mg    2. Long term (current) use of oral hypoglycemic drugs  Z79.84     3. Long-term (current) use of injectable non-insulin antidiabetic drugs  Z79.85  4. Essential hypertension  I10     5. Hypertensive retinopathy of both eyes  H35.033     6. Combined forms of  age-related cataract of both eyes  H25.813      1-3. 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, OU (OD > OS)  - delayed f/u -- 15.5 wks instead of 12 due to work schedule on 08.07.24  - last A1c  6.4 on 10.25.24 - pt with history of PRP laser OU in [place] ~10+ yrs ago -- pt can't remember provider - exam shows scattered MA/IRH and 360 PRP, room for fill in OU - FA (9.19.22) shows late-leaking MA, vascular nonperfusion, +NV OU -- will need PRP fill in OU - s/p PRP OD (09.29.22) - s/p PRP OS (11.14.22) - s/p IVA OD #1 (09.19.22) - s/p IVA OS #1 (09.23.22) - s/p IVA OU #2 (10.21.22), #3 (12.23.22), #4 (01.06.23), #5 (02.03.23), #6 (03.03.23), #7 (04.07.23), #8 (05.19.23), #9 (07.14.23), #10 (09.01.23), #11 (10.20.23) #12 (12.08.23), #13 (02.09.24), 14 (04.19.24) #15 (08.07.24) **interval worsening of IRF at 8 weeks noted on 07.14.23 exam** - BCVA OD: 20/30, OS: 20/25 - OCT shows OD: interval improvement in IRF/IRHM -- nasal macula -- just trace cystic changes remain, partial PVD; OS: Trace cystic changes temporal mac -- improved at 12 weeks - recommend IVA OU #16 today, 11.01.24 for DME with f/u at 12 weeks again - pt wishes to proceed - RBA of procedure discussed, questions answered - informed consent obtained and signed  - see procedure notes - f/u 12 weeks DFE/OCT, possible injection(s)  4,5. Hypertensive retinopathy OU - discussed importance of tight BP control -  monitor for now  6. Mixed Cataract OU - The symptoms of cataract, surgical options, and treatments and risks were discussed with patient. - discussed diagnosis and progression - not yet visually significant - monitor for now  Ophthalmic Meds Ordered this visit:  Meds ordered this encounter  Medications   Bevacizumab (AVASTIN) SOLN 1.25 mg   Bevacizumab (AVASTIN) SOLN 1.25 mg     Return in about 12 weeks (around 12/21/2023) for f/u PDR OU, DFE, OCT.  There are no Patient Instructions on file for this visit.    This document serves as a record of services personally performed by Karie Chimera, MD, PhD. It was created on their behalf by Berlin Hun COT, an ophthalmic technician. The creation of this record is the provider's dictation and/or activities during the visit.    Electronically signed by: Berlin Hun COT 10.31.24 12:52 PM  This document serves as a record of services personally performed by Karie Chimera, MD, PhD. It was created on their behalf by Glee Arvin. Manson Passey, OA an ophthalmic technician. The creation of this record is the provider's dictation and/or activities during the visit.    Electronically signed by: Glee Arvin. Manson Passey, OA 09/28/23 12:52 PM  Karie Chimera, M.D., Ph.D. Diseases & Surgery of the Retina and Vitreous [facility] 09/28/2023   I have reviewed the above documentation for accuracy and completeness, and I agree with the above. Karie Chimera, M.D., Ph.D. 09/28/23 12:55 PM  Abbreviations: M myopia (nearsighted); A astigmatism; H hyperopia (farsighted); P presbyopia; Mrx spectacle prescription;  CTL contact lenses; OD right eye; OS left eye; OU both eyes  XT exotropia; ET esotropia; PEK punctate epithelial keratitis; PEE punctate epithelial erosions; DES dry eye syndrome; MGD meibomian gland dysfunction; ATs artificial tears; PFAT's preservative free artificial tears; NSC nuclear sclerotic cataract; PSC posterior subcapsular cataract;  ERM epi-retinal membrane; PVD posterior vitreous detachment; RD retinal detachment; DM diabetes mellitus; DR diabetic retinopathy; NPDR non-proliferative diabetic retinopathy; PDR proliferative diabetic retinopathy; CSME clinically significant macular edema; DME diabetic macular edema; dbh dot blot hemorrhages; CWS cotton wool spot; POAG primary open angle glaucoma; C/D cup-to-disc ratio; HVF humphrey visual field; GVF goldmann visual field; OCT optical coherence tomography; IOP intraocular pressure; BRVO  Branch retinal vein occlusion; CRVO central retinal vein occlusion; CRAO central retinal artery occlusion; BRAO branch retinal artery occlusion; RT retinal tear; SB scleral buckle; PPV pars plana vitrectomy; VH Vitreous hemorrhage; PRP panretinal laser photocoagulation; IVK intravitreal kenalog; VMT vitreomacular traction; MH Macular hole;  NVD neovascularization of the disc; NVE neovascularization elsewhere; AREDS age related eye disease study; ARMD age related macular degeneration; POAG primary open angle glaucoma; EBMD epithelial/anterior basement membrane dystrophy; ACIOL anterior chamber intraocular lens; IOL intraocular lens; PCIOL posterior chamber intraocular lens; Phaco/IOL phacoemulsification with intraocular lens placement; PRK photorefractive keratectomy; LASIK laser assisted in situ keratomileusis; HTN hypertension; DM diabetes mellitus; COPD chronic obstructive pulmonary disease

## 2023-09-28 ENCOUNTER — Ambulatory Visit (INDEPENDENT_AMBULATORY_CARE_PROVIDER_SITE_OTHER): Payer: No Typology Code available for payment source | Admitting: Ophthalmology

## 2023-09-28 ENCOUNTER — Encounter (INDEPENDENT_AMBULATORY_CARE_PROVIDER_SITE_OTHER): Payer: Self-pay | Admitting: Ophthalmology

## 2023-09-28 DIAGNOSIS — Z7985 Long-term (current) use of injectable non-insulin antidiabetic drugs: Secondary | ICD-10-CM

## 2023-09-28 DIAGNOSIS — E113513 Type 2 diabetes mellitus with proliferative diabetic retinopathy with macular edema, bilateral: Secondary | ICD-10-CM

## 2023-09-28 DIAGNOSIS — Z7984 Long term (current) use of oral hypoglycemic drugs: Secondary | ICD-10-CM | POA: Diagnosis not present

## 2023-09-28 DIAGNOSIS — H35033 Hypertensive retinopathy, bilateral: Secondary | ICD-10-CM

## 2023-09-28 DIAGNOSIS — I10 Essential (primary) hypertension: Secondary | ICD-10-CM

## 2023-09-28 DIAGNOSIS — H25813 Combined forms of age-related cataract, bilateral: Secondary | ICD-10-CM

## 2023-09-28 MED ORDER — BEVACIZUMAB CHEMO INJECTION 1.25MG/0.05ML SYRINGE FOR KALEIDOSCOPE
1.2500 mg | INTRAVITREAL | Status: AC | PRN
  Administered 2023-09-28: 1.25 mg via INTRAVITREAL

## 2023-10-08 ENCOUNTER — Other Ambulatory Visit: Payer: Self-pay | Admitting: Family Medicine

## 2023-10-12 ENCOUNTER — Other Ambulatory Visit: Payer: Self-pay | Admitting: Family Medicine

## 2023-10-12 DIAGNOSIS — I10 Essential (primary) hypertension: Secondary | ICD-10-CM

## 2023-10-12 DIAGNOSIS — E119 Type 2 diabetes mellitus without complications: Secondary | ICD-10-CM

## 2023-11-28 ENCOUNTER — Other Ambulatory Visit: Payer: Self-pay | Admitting: Family Medicine

## 2023-12-21 ENCOUNTER — Encounter (INDEPENDENT_AMBULATORY_CARE_PROVIDER_SITE_OTHER): Payer: No Typology Code available for payment source | Admitting: Ophthalmology

## 2024-01-03 NOTE — Progress Notes (Signed)
 [facility] - Clinic Note  01/04/2024     CHIEF COMPLAINT Patient presents for Retina Follow Up   HISTORY OF PRESENT ILLNESS: Edward Riley is a 58 y.o. male who presents to the clinic today for:   HPI     Retina Follow Up   Patient presents with  Diabetic Retinopathy.  In both eyes.  Severity is mild.  Duration of 12 weeks.  Since onset it is stable.  I, the attending physician,  performed the HPI with the patient and updated documentation appropriately.        Comments   12 week Retina eval . Patient states vision is the same. A1c 5.7      Last edited by Valdemar Rogue, MD on 01/04/2024 11:51 AM.     Patient states he's gotten new glasses, his A1c was 5.7 in October / November  Referring physician: Alvia Bring, DO [address] [address]  [address] [place],  [place] [address]  HISTORICAL INFORMATION:   Selected notes from the MEDICAL RECORD NUMBER Referred by Dr. Bring Alvia for diabetic eye exam LEE: [facility] Ocular Hx- PMH-    CURRENT MEDICATIONS: No current outpatient medications on file. (Ophthalmic Drugs)   No current facility-administered medications for this visit. (Ophthalmic Drugs)   Current Outpatient Medications (Other)  Medication Sig   AMBULATORY NON FORMULARY MEDICATION Bayer test strips and lancets. Use to check blood sugar twice a day. Dx: Type Two Diabetes Mellitus   amLODipine  (NORVASC ) 10 MG tablet TAKE 1 TABLET BY MOUTH DAILY   BD PEN NEEDLE NANO U/F 32G X 4 MM MISC USE AS DIRECTED   Blood Glucose Monitoring Suppl (ONE TOUCH ULTRA 2) w/Device KIT Use to check blood sugar twice a day. Send strips and lancets, quantity 100 refills 3.  Dx: Type Two Diabetes Mellitus   glucose blood (ONE TOUCH ULTRA TEST) test strip Use as instructed   hydrochlorothiazide  (HYDRODIURIL ) 25 MG tablet TAKE 1 TABLET BY MOUTH DAILY   Insulin  Pen Needle (PEN NEEDLES) 31G X 8 MM MISC Use the pen needles for both the insulin   and victoza  pens daily   Lancet Device MISC Check blood sugar once or twice daily as needed.   lisinopril  (ZESTRIL ) 20 MG tablet TAKE ONE TABLET BY MOUTH EVERY MORNING   MOUNJARO  15 MG/0.5ML Pen INJECT 15 MG UNDER THE SKIN ONCE WEEKLY   pioglitazone  (ACTOS ) 45 MG tablet TAKE 1 TABLET BY MOUTH DAILY. NEEDS APPOINTMENT   rosuvastatin  (CRESTOR ) 20 MG tablet TAKE 1 TABLET BY MOUTH DAILY   sildenafil  (VIAGRA ) 100 MG tablet Take 0.5-1 tablets (50-100 mg total) by mouth daily as needed for erectile dysfunction.   XIGDUO  XR 08-999 MG TB24 TAKE 1 TABLET BY MOUTH DAILY   No current facility-administered medications for this visit. (Other)   REVIEW OF SYSTEMS: ROS   Positive for: Endocrine, Cardiovascular, Eyes Negative for: Constitutional, Gastrointestinal, Neurological, Skin, Genitourinary, Musculoskeletal, HENT, Respiratory, Psychiatric, Allergic/Imm, Heme/Lymph Last edited by German Olam BRAVO, COT on 01/04/2024  9:16 AM.      ALLERGIES No Known Allergies  PAST MEDICAL HISTORY Past Medical History:  Diagnosis Date   Diabetes mellitus without complication (HCC)    Hyperlipidemia    Hypertension    Obese    Retinal hemorrhage, both eyes    Sessile colonic polyp 02/04/2019   Colonoscopy February 2020.   History reviewed. No pertinent surgical history.  FAMILY HISTORY Family History  Problem Relation Age of Onset   Arthritis Mother  Hyperlipidemia Mother    Hypertension Mother    Cancer Father 42       Prostate (Metastatic) Lung, Esophagus, Stomach   Alcohol abuse Father    Kidney disease Brother    Hypertension Brother    Cancer Paternal Aunt        Breast Cancer   Alzheimer's disease Maternal Grandmother    Heart disease Paternal Grandmother    Diabetes Brother    Alcohol abuse Maternal Uncle    SOCIAL HISTORY Social History   Tobacco Use   Smoking status: Never   Smokeless tobacco: Never  Vaping Use   Vaping status: Never Used  Substance Use Topics   Alcohol use:  Yes    Comment: Rarely   Drug use: No       OPHTHALMIC EXAM: Base Eye Exam     Visual Acuity (Snellen - Linear)       Right Left   Dist cc 20/30-2 20/25   Dist ph cc 20/25-2 20/20-2    Correction: Glasses         Tonometry (Tonopen, 9:23 AM)       Right Left   Pressure 10 11         Pupils       Dark Light Shape React APD   Right 3 2 Round Brisk None   Left 3 2 Round Brisk None         Visual Fields (Counting fingers)       Left Right    Full Full         Extraocular Movement       Right Left    Full, Ortho Full, Ortho         Neuro/Psych     Oriented x3: Yes   Mood/Affect: Normal         Dilation     Both eyes: 1.0% Mydriacyl, 2.5% Phenylephrine @ 9:23 AM           Slit Lamp and Fundus Exam     Slit Lamp Exam       Right Left   Lids/Lashes Dermatochalasis - upper lid Dermatochalasis - upper lid   Conjunctiva/Sclera nasal pingeucula nasal and temporal pinguecula   Cornea trace PEE trace PEE   Anterior Chamber Deep and quiet Deep and quiet   Iris Round and dilated, No NVI Round and dilated, No NVI   Lens 2+ Nuclear sclerosis, 2+ Cortical cataract 2+ Nuclear sclerosis, 2+ Cortical cataract   Anterior Vitreous Mild Vitreous syneresis Mild Vitreous syneresis         Fundus Exam       Right Left   Disc Pink and Sharp, PPA, Compact mild tilt, Pink and Sharp, PPA   C/D Ratio 0.2 0.2   Macula good foveal reflex, trace MA, no exudates, trace perifoveal cystic changes Good foveal reflex, Microaneurysms -- slightly improved, trace cystic changes temporal macula   Vessels ?NV temporal periphery-- regressing, mild attenuation, mild tortuosity trace focal fibrosis along distal IT arcade, attenuated, Tortuous   Periphery Attached, 360 PRP with good laser fill in, rare DBH temporal periphery improved Attached, 360 PRP with good fill in, rare IRH/DBH           Refraction     Wearing Rx       Sphere Cylinder Axis   Right -1.75  +0.75 146   Left -1.50 +0.25 162           IMAGING AND PROCEDURES  Imaging and Procedures for  01/04/2024  OCT, Retina - OU - Both Eyes       Right Eye Quality was good. Central Foveal Thickness: 350. Progression has been stable. Findings include normal foveal contour, no SRF, intraretinal fluid, vitreomacular adhesion (Trace cystic changes nasal macula, partial PVD).   Left Eye Quality was good. Central Foveal Thickness: 349. Progression has been stable. Findings include normal foveal contour, no IRF, no SRF, vitreomacular adhesion (Trace cystic changes temporal mac ).   Notes *Images captured and stored on drive  Diagnosis / Impression:  +DME OU OD: Trace cystic changes nasal macula, partial PVD OS: Trace cystic changes temporal mac   Clinical management:  See below  Abbreviations: NFP - Normal foveal profile. CME - cystoid macular edema. PED - pigment epithelial detachment. IRF - intraretinal fluid. SRF - subretinal fluid. EZ - ellipsoid zone. ERM - epiretinal membrane. ORA - outer retinal atrophy. ORT - outer retinal tubulation. SRHM - subretinal hyper-reflective material. IRHM - intraretinal hyper-reflective material      Intravitreal Injection, Pharmacologic Agent - OD - Right Eye       Time Out 01/04/2024. 10:30 AM. Confirmed correct patient, procedure, site, and patient consented.   Anesthesia Topical anesthesia was used. Anesthetic medications included Lidocaine 2%, Proparacaine 0.5%.   Procedure Preparation included 5% betadine to ocular surface, eyelid speculum. A supplied (32g) needle was used.   Injection: 1.25 mg Bevacizumab  1.25mg /0.42ml   Route: Intravitreal, Site: Right Eye   NDC: H525437, Lot: [id number], Expiration date: 02/12/2024   Post-op Post injection exam found visual acuity of at least counting fingers. The patient tolerated the procedure well. There were no complications. The patient received written and verbal post procedure care  education. Post injection medications were not given.      Intravitreal Injection, Pharmacologic Agent - OS - Left Eye       Time Out 01/04/2024. 10:31 AM. Confirmed correct patient, procedure, site, and patient consented.   Anesthesia Topical anesthesia was used. Anesthetic medications included Lidocaine 2%, Proparacaine 0.5%.   Procedure Preparation included 5% betadine to ocular surface, eyelid speculum. A supplied (33g) needle was used.   Injection: 1.25 mg Bevacizumab  1.25mg /0.56ml   Route: Intravitreal, Site: Left Eye   NDC: H525437, Lot: [id number], Expiration date: 02/14/2024   Post-op Post injection exam found visual acuity of at least counting fingers. The patient tolerated the procedure well. There were no complications. The patient received written and verbal post procedure care education. Post injection medications were not given.            ASSESSMENT/PLAN:   ICD-10-CM   1. Proliferative diabetic retinopathy of both eyes with macular edema associated with type 2 diabetes mellitus (HCC)  E11.3513 OCT, Retina - OU - Both Eyes    Intravitreal Injection, Pharmacologic Agent - OD - Right Eye    Intravitreal Injection, Pharmacologic Agent - OS - Left Eye    Bevacizumab  (AVASTIN ) SOLN 1.25 mg    Bevacizumab  (AVASTIN ) SOLN 1.25 mg    2. Long term (current) use of oral hypoglycemic drugs  Z79.84     3. Long-term (current) use of injectable non-insulin  antidiabetic drugs  Z79.85     4. Essential hypertension  I10     5. Hypertensive retinopathy of both eyes  H35.033     6. Combined forms of age-related cataract of both eyes  H25.813      1-3. 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, OU (OD > OS)  - delayed f/u - 14 wks instead of 12 due to  weather on 02.07.25  - delayed f/u -- 15.5 wks instead of 12 due to work schedule on 08.07.24  - last A1c  6.4 on 10.25.24 - pt with history of PRP laser OU in [place] ~10+ yrs ago -- pt can't remember provider -  exam shows scattered MA/IRH and 360 PRP, room for fill in OU - FA (9.19.22) shows late-leaking MA, vascular nonperfusion, +NV OU -- will need PRP fill in OU - s/p PRP OD (09.29.22) - s/p PRP OS (11.14.22) - s/p IVA OD #1 (09.19.22) - s/p IVA OS #1 (09.23.22) - s/p IVA OU #2 (10.21.22), #3 (12.23.22), #4 (01.06.23), #5 (02.03.23), #6 (03.03.23), #7 (04.07.23), #8 (05.19.23), #9 (07.14.23), #10 (09.01.23), #11 (10.20.23) #12 (12.08.23), #13 (02.09.24), 14 (04.19.24), #15 (08.07.24), #16 (11.01.24) **interval worsening of IRF at 8 weeks noted on 07.14.23 exam** - BCVA OD: 20/25 from 20/30, OS: 20/20 from 20/25 - OCT shows OD: Trace cystic changes nasal macula, partial PVD; OS: Trace cystic changes temporal mac at 14 weeks - recommend IVA OU #17 today, 02.07.25 for DME with f/u at 14 weeks again - pt wishes to proceed - RBA of procedure discussed, questions answered - informed consent obtained and signed  - see procedure notes - f/u 14 weeks DFE/OCT, possible injection(s)  4,5. Hypertensive retinopathy OU - discussed importance of tight BP control -  monitor for now  6. Mixed Cataract OU - The symptoms of cataract, surgical options, and treatments and risks were discussed with patient. - discussed diagnosis and progression - monitor  Ophthalmic Meds Ordered this visit:  Meds ordered this encounter  Medications   Bevacizumab  (AVASTIN ) SOLN 1.25 mg   Bevacizumab  (AVASTIN ) SOLN 1.25 mg     Return in about 14 weeks (around 04/11/2024) for f/u PDR OU, DFE, OCT.  There are no Patient Instructions on file for this visit.   This document serves as a record of services personally performed by Redell JUDITHANN Hans, MD, PhD. It was created on their behalf by Delon Newness COT, an ophthalmic technician. The creation of this record is the provider's dictation and/or activities during the visit.    Electronically signed by: Delon Newness COT 02.06.2511:56 AM  This document serves as a  record of services personally performed by Redell JUDITHANN Hans, MD, PhD. It was created on their behalf by Alan PARAS. Delores, OA an ophthalmic technician. The creation of this record is the provider's dictation and/or activities during the visit.    Electronically signed by: Alan PARAS. Delores, OA 01/04/24 11:56 AM  Redell JUDITHANN Hans, M.D., Ph.D. Diseases & Surgery of the Retina and Vitreous [facility] 01/04/2024   I have reviewed the above documentation for accuracy and completeness, and I agree with the above. Redell JUDITHANN Hans, M.D., Ph.D. 01/04/24 11:59 AM   Abbreviations: M myopia (nearsighted); A astigmatism; H hyperopia (farsighted); P presbyopia; Mrx spectacle prescription;  CTL contact lenses; OD right eye; OS left eye; OU both eyes  XT exotropia; ET esotropia; PEK punctate epithelial keratitis; PEE punctate epithelial erosions; DES dry eye syndrome; MGD meibomian gland dysfunction; ATs artificial tears; PFAT's preservative free artificial tears; NSC nuclear sclerotic cataract; PSC posterior subcapsular cataract; ERM epi-retinal membrane; PVD posterior vitreous detachment; RD retinal detachment; DM diabetes mellitus; DR diabetic retinopathy; NPDR non-proliferative diabetic retinopathy; PDR proliferative diabetic retinopathy; CSME clinically significant macular edema; DME diabetic macular edema; dbh dot blot hemorrhages; CWS cotton wool spot; POAG primary open angle glaucoma; C/D cup-to-disc ratio; HVF humphrey visual field; GVF goldmann  visual field; OCT optical coherence tomography; IOP intraocular pressure; BRVO Branch retinal vein occlusion; CRVO central retinal vein occlusion; CRAO central retinal artery occlusion; BRAO branch retinal artery occlusion; RT retinal tear; SB scleral buckle; PPV pars plana vitrectomy; VH Vitreous hemorrhage; PRP panretinal laser photocoagulation; IVK intravitreal kenalog ; VMT vitreomacular traction; MH Macular hole;  NVD neovascularization of the  disc; NVE neovascularization elsewhere; AREDS age related eye disease study; ARMD age related macular degeneration; POAG primary open angle glaucoma; EBMD epithelial/anterior basement membrane dystrophy; ACIOL anterior chamber intraocular lens; IOL intraocular lens; PCIOL posterior chamber intraocular lens; Phaco/IOL phacoemulsification with intraocular lens placement; PRK photorefractive keratectomy; LASIK laser assisted in situ keratomileusis; HTN hypertension; DM diabetes mellitus; COPD chronic obstructive pulmonary disease

## 2024-01-04 ENCOUNTER — Ambulatory Visit (INDEPENDENT_AMBULATORY_CARE_PROVIDER_SITE_OTHER): Payer: No Typology Code available for payment source | Admitting: Ophthalmology

## 2024-01-04 ENCOUNTER — Encounter (INDEPENDENT_AMBULATORY_CARE_PROVIDER_SITE_OTHER): Payer: Self-pay | Admitting: Ophthalmology

## 2024-01-04 DIAGNOSIS — Z7984 Long term (current) use of oral hypoglycemic drugs: Secondary | ICD-10-CM | POA: Diagnosis not present

## 2024-01-04 DIAGNOSIS — E113513 Type 2 diabetes mellitus with proliferative diabetic retinopathy with macular edema, bilateral: Secondary | ICD-10-CM | POA: Diagnosis not present

## 2024-01-04 DIAGNOSIS — H35033 Hypertensive retinopathy, bilateral: Secondary | ICD-10-CM

## 2024-01-04 DIAGNOSIS — Z7985 Long-term (current) use of injectable non-insulin antidiabetic drugs: Secondary | ICD-10-CM | POA: Diagnosis not present

## 2024-01-04 DIAGNOSIS — H25813 Combined forms of age-related cataract, bilateral: Secondary | ICD-10-CM

## 2024-01-04 DIAGNOSIS — I10 Essential (primary) hypertension: Secondary | ICD-10-CM

## 2024-01-04 LAB — HM DIABETES EYE EXAM

## 2024-01-04 MED ORDER — BEVACIZUMAB CHEMO INJECTION 1.25MG/0.05ML SYRINGE FOR KALEIDOSCOPE
1.2500 mg | INTRAVITREAL | Status: AC | PRN
  Administered 2024-01-04: 1.25 mg via INTRAVITREAL

## 2024-02-02 ENCOUNTER — Other Ambulatory Visit: Payer: Self-pay | Admitting: Family Medicine

## 2024-03-21 ENCOUNTER — Ambulatory Visit: Payer: No Typology Code available for payment source | Admitting: Family Medicine

## 2024-03-21 ENCOUNTER — Encounter: Payer: Self-pay | Admitting: Family Medicine

## 2024-03-21 VITALS — BP 111/68 | HR 78 | Ht 68.0 in | Wt 220.0 lb

## 2024-03-21 DIAGNOSIS — Z7985 Long-term (current) use of injectable non-insulin antidiabetic drugs: Secondary | ICD-10-CM

## 2024-03-21 DIAGNOSIS — E1121 Type 2 diabetes mellitus with diabetic nephropathy: Secondary | ICD-10-CM

## 2024-03-21 DIAGNOSIS — Z23 Encounter for immunization: Secondary | ICD-10-CM | POA: Diagnosis not present

## 2024-03-21 DIAGNOSIS — I152 Hypertension secondary to endocrine disorders: Secondary | ICD-10-CM

## 2024-03-21 DIAGNOSIS — E1169 Type 2 diabetes mellitus with other specified complication: Secondary | ICD-10-CM

## 2024-03-21 DIAGNOSIS — E1159 Type 2 diabetes mellitus with other circulatory complications: Secondary | ICD-10-CM | POA: Diagnosis not present

## 2024-03-21 DIAGNOSIS — Z7984 Long term (current) use of oral hypoglycemic drugs: Secondary | ICD-10-CM

## 2024-03-21 DIAGNOSIS — E785 Hyperlipidemia, unspecified: Secondary | ICD-10-CM

## 2024-03-21 LAB — POCT GLYCOSYLATED HEMOGLOBIN (HGB A1C): HbA1c, POC (controlled diabetic range): 6.2 % (ref 0.0–7.0)

## 2024-03-21 MED ORDER — DAPAGLIFLOZIN PROPANEDIOL 10 MG PO TABS: 10.0000 mg | ORAL_TABLET | Freq: Every day | ORAL | 3 refills | Status: AC

## 2024-03-21 NOTE — Progress Notes (Signed)
 Rodrigus Kilker - 58 y.o. male MRN [id number]  Date of birth: Nov 03, 1966  Subjective Chief Complaint  Patient presents with   Diabetes   Hypertension    HPI Alban Marucci is a 58 y.o. male here today for follow up visit.   He reports that he is doing well.   He remains on combination of Xigduo , Actos  and Mounjaro .  He is tolerating these well at current strength.  A1c today is 6.2%.  Weight is stable.  Complications include retinopathy.  He is followed by Retina specialist.  Remains on crestor  for management of associated HLD.    Continues on lisinopril , hctz and amlodipine  for management of HTN.  BP is well controlled.  He has  not had chest pain, shortness of breath, palpitations, headache or vision changes  ROS:  A comprehensive ROS was completed and negative except as noted per HPI  No Known Allergies  Past Medical History:  Diagnosis Date   Diabetes mellitus without complication (HCC)    Hyperlipidemia    Hypertension    Obese    Retinal hemorrhage, both eyes    Sessile colonic polyp 02/04/2019   Colonoscopy February 2020.    History reviewed. No pertinent surgical history.  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    Comment: [employer]  Tobacco Use   Smoking status: Never   Smokeless tobacco: Never  Vaping Use   Vaping status: Never Used  Substance and Sexual Activity   Alcohol use: Yes    Comment: Rarely   Drug use: No   Sexual activity: Yes  Other Topics Concern   Not on file  Social History Narrative   Marital Status: Single    Children: None   Pets: None    Living Situation: Lives alone   Occupation: Naval architect Research officer, trade union)     Education:  12 th Grade    Tobacco Use/Exposure:  None    Alcohol  Use:  Rarely   Drug Use:  None   Diet:  Regular   Exercise:  Very Active at work    Hobbies:  Teacher, early years/pre                Social Drivers of Corporate investment banker Strain: Low Risk  (09/21/2023)   Overall Financial Resource Strain (CARDIA)    Difficulty of Paying Living Expenses: Not hard at all  Food Insecurity: No Food Insecurity (09/21/2023)   Hunger Vital Sign    Worried About Running Out of Food in the Last Year: Never true    Ran Out of Food in the Last Year: Never true  Transportation Needs: No Transportation Needs (09/21/2023)   PRAPARE - Administrator, Civil Service (Medical): No    Lack of Transportation (Non-Medical): No  Physical Activity: Insufficiently Active (09/21/2023)   Exercise Vital Sign    Days of Exercise per Week: 4 days    Minutes of Exercise per Session: 30 min  Stress: No Stress Concern Present (09/21/2023)   [employer] of Occupational Health - Occupational Stress Questionnaire    Feeling of Stress : Not at all  Social Connections: Socially Isolated (09/21/2023)   Social Connection and Isolation Panel [NHANES]  Frequency of Communication with Friends and Family: More than three times a week    Frequency of Social Gatherings with Friends and Family: More than three times a week    Attends Religious Services: Never    Database administrator or Organizations: No    Attends Engineer, structural: Not on file    Marital Status: Never married    Family History  Problem Relation Age of Onset   Arthritis Mother    Hyperlipidemia Mother    Hypertension Mother    Cancer Father 60       Prostate (Metastatic) Lung, Esophagus, Stomach   Alcohol abuse Father    Kidney disease Brother    Hypertension Brother    Cancer Paternal Aunt        Breast Cancer   Alzheimer's disease Maternal Grandmother    Heart disease Paternal Grandmother    Diabetes Brother    Alcohol abuse Maternal Uncle     Health Maintenance   Topic Date Due   COVID-19 Vaccine (2 - 2024-25 season) 09/20/2024 (Originally 07/29/2023)   Hepatitis C Screening  03/21/2025 (Originally 04/04/1986)   HIV Screening  03/21/2025 (Originally 04/05/1983)   INFLUENZA VACCINE  06/27/2024   Diabetic kidney evaluation - eGFR measurement  09/20/2024   Diabetic kidney evaluation - Urine ACR  09/20/2024   HEMOGLOBIN A1C  09/20/2024   OPHTHALMOLOGY EXAM  01/03/2025   FOOT EXAM  03/21/2025   DTaP/Tdap/Td (2 - Td or Tdap) 11/17/2026   Colonoscopy  01/24/2029   Pneumococcal Vaccine 74-2 Years old  Completed   Zoster Vaccines- Shingrix  Completed   HPV VACCINES  Aged Out   Meningococcal B Vaccine  Aged Out     ----------------------------------------------------------------------------------------------------------------------------------------------------------------------------------------------------------------- Physical Exam BP 111/68 (BP Location: Left Arm, Patient Position: Sitting, Cuff Size: Normal)   Pulse 78   Ht 5\' 8"  (1.727 m)   Wt 220 lb (99.8 kg)   SpO2 100%   BMI 33.45 kg/m   Physical Exam Constitutional:      Appearance: Normal appearance.  Eyes:     General: No scleral icterus. Cardiovascular:     Rate and Rhythm: Normal rate and regular rhythm.  Pulmonary:     Effort: Pulmonary effort is normal.     Breath sounds: Normal breath sounds.  Musculoskeletal:     Cervical back: Neck supple.  Neurological:     Mental Status: He is alert.  Psychiatric:        Mood and Affect: Mood normal.        Behavior: Behavior normal.     ------------------------------------------------------------------------------------------------------------------------------------------------------------------------------------------------------------------- Assessment and Plan  Hyperlipidemia associated with type 2 diabetes mellitus (HCC) Doing well with rosuvastatin .  Recommend continuation at current strength.    Type 2 diabetes mellitus  with diabetic nephropathy Blood sugars are well controlled.  Continue mounjaro  with titration as directed.  Dropping xigduo  and will continue with just farxiga.    Hypertension associated with diabetes (HCC) BP is well controlled.  Continue current medications.    Meds ordered this encounter  Medications   dapagliflozin propanediol (FARXIGA) 10 MG TABS tablet    Sig: Take 1 tablet (10 mg total) by mouth daily before breakfast.    Dispense:  90 tablet    Refill:  3    Return in about 6 months (around 09/20/2024) for Type 2 Diabetes, Hypertension.

## 2024-03-21 NOTE — Assessment & Plan Note (Signed)
 BP is well controlled Continue current medications

## 2024-03-21 NOTE — Assessment & Plan Note (Signed)
 Blood sugars are well controlled.  Continue mounjaro  with titration as directed.  Dropping xigduo  and will continue with just farxiga.

## 2024-03-21 NOTE — Assessment & Plan Note (Signed)
Doing well with rosuvastatin.  Recommend continuation at current strength.   ?

## 2024-04-10 ENCOUNTER — Other Ambulatory Visit: Payer: Self-pay | Admitting: Family Medicine

## 2024-04-10 NOTE — Progress Notes (Signed)
 [facility] - Clinic Note  04/11/2024     CHIEF COMPLAINT Patient presents for Retina Follow Up   HISTORY OF PRESENT ILLNESS: Edward Riley is a 58 y.o. male who presents to the clinic today for:   HPI     Retina Follow Up   Patient presents with  Diabetic Retinopathy.  In both eyes.  This started 14 weeks ago.  I, the attending physician,  performed the HPI with the patient and updated documentation appropriately.        Comments   Patient here for 14 weeks retina follow up for PDR OU. Patient states vision doing the same. No eye pain.       Last edited by Phyllip Claw, MD on 04/11/2024 12:41 PM.      Patient states his vision is stable, he went to the dr about 2 weeks ago and his A1c was 6.2, his BP was good, he is about to stop metformin   Referring physician: Adela Holter, DO [address] [facility] [address]  [address] [place],  [place] [address]  HISTORICAL INFORMATION:   Selected notes from the MEDICAL RECORD NUMBER Referred by Dr. Adela Holter for diabetic eye exam LEE: [facility] Ocular Hx- PMH-    CURRENT MEDICATIONS: No current outpatient medications on file. (Ophthalmic Drugs)   No current facility-administered medications for this visit. (Ophthalmic Drugs)   Current Outpatient Medications (Other)  Medication Sig   AMBULATORY NON FORMULARY MEDICATION Bayer test strips and lancets. Use to check blood sugar twice a day. Dx: Type Two Diabetes Mellitus   BD PEN NEEDLE NANO U/F 32G X 4 MM MISC USE AS DIRECTED   Blood Glucose Monitoring Suppl (ONE TOUCH ULTRA 2) w/Device KIT Use to check blood sugar twice a day. Send strips and lancets, quantity 100 refills 3.  Dx: Type Two Diabetes Mellitus   dapagliflozin  propanediol (FARXIGA ) 10 MG TABS tablet Take 1 tablet (10 mg total) by mouth daily before breakfast.   glucose blood (ONE TOUCH ULTRA TEST) test strip Use as instructed   hydrochlorothiazide  (HYDRODIURIL ) 25 MG tablet TAKE  1 TABLET BY MOUTH DAILY   Insulin  Pen Needle (PEN NEEDLES) 31G X 8 MM MISC Use the pen needles for both the insulin  and victoza  pens daily   Lancet Device MISC Check blood sugar once or twice daily as needed.   lisinopril  (ZESTRIL ) 20 MG tablet TAKE ONE TABLET BY MOUTH EVERY MORNING   MOUNJARO  15 MG/0.5ML Pen INJECT 15 MG UNDER THE SKIN ONCE WEEKLY   pioglitazone  (ACTOS ) 45 MG tablet TAKE 1 TABLET BY MOUTH DAILY. NEEDS APPOINTMENT   rosuvastatin  (CRESTOR ) 20 MG tablet TAKE 1 TABLET BY MOUTH DAILY   amLODipine  (NORVASC ) 10 MG tablet TAKE 1 TABLET BY MOUTH DAILY   sildenafil  (VIAGRA ) 100 MG tablet TAKE 1/2 TO 1 TABLET BY MOUTH DAILY AS NEEDED   No current facility-administered medications for this visit. (Other)   REVIEW OF SYSTEMS: ROS   Positive for: Endocrine, Cardiovascular, Eyes Negative for: Constitutional, Gastrointestinal, Neurological, Skin, Genitourinary, Musculoskeletal, HENT, Respiratory, Psychiatric, Allergic/Imm, Heme/Lymph Last edited by Sylvan Evener, COA on 04/11/2024  9:16 AM.     ALLERGIES No Known Allergies  PAST MEDICAL HISTORY Past Medical History:  Diagnosis Date   Diabetes mellitus without complication (HCC)    Hyperlipidemia    Hypertension    Obese    Retinal hemorrhage, both eyes    Sessile colonic polyp 02/04/2019   Colonoscopy February 2020.   History reviewed. No  pertinent surgical history.  FAMILY HISTORY Family History  Problem Relation Age of Onset   Arthritis Mother    Hyperlipidemia Mother    Hypertension Mother    Cancer Father 37       Prostate (Metastatic) Lung, Esophagus, Stomach   Alcohol abuse Father    Kidney disease Brother    Hypertension Brother    Cancer Paternal Aunt        Breast Cancer   Alzheimer's disease Maternal Grandmother    Heart disease Paternal Grandmother    Diabetes Brother    Alcohol abuse Maternal Uncle    SOCIAL HISTORY Social History   Tobacco Use   Smoking status: Never   Smokeless tobacco:  Never  Vaping Use   Vaping status: Never Used  Substance Use Topics   Alcohol use: Yes    Comment: Rarely   Drug use: No       OPHTHALMIC EXAM: Base Eye Exam     Visual Acuity (Snellen - Linear)       Right Left   Dist cc 20/25 20/20 -2    Correction: Glasses         Tonometry (Tonopen, 9:14 AM)       Right Left   Pressure 15 14         Pupils       Dark Light Shape React APD   Right 3 2 Round Brisk None   Left 3 2 Round Brisk None         Visual Fields (Counting fingers)       Left Right    Full Full         Extraocular Movement       Right Left    Full, Ortho Full, Ortho         Neuro/Psych     Oriented x3: Yes   Mood/Affect: Normal         Dilation     Both eyes: 1.0% Mydriacyl, 2.5% Phenylephrine @ 9:14 AM           Slit Lamp and Fundus Exam     Slit Lamp Exam       Right Left   Lids/Lashes Dermatochalasis - upper lid Dermatochalasis - upper lid   Conjunctiva/Sclera nasal pingeucula nasal and temporal pinguecula   Cornea trace PEE trace PEE   Anterior Chamber Deep and quiet Deep and quiet   Iris Round and dilated, No NVI Round and dilated, No NVI   Lens 2+ Nuclear sclerosis, 2+ Cortical cataract 2+ Nuclear sclerosis, 2+ Cortical cataract   Anterior Vitreous Mild Vitreous syneresis Mild Vitreous syneresis         Fundus Exam       Right Left   Disc Pink and Sharp, PPA, Compact mild tilt, Pink and Sharp, PPA   C/D Ratio 0.2 0.2   Macula good foveal reflex, trace MA, no exudates, trace perifoveal cystic changes -- slightly improved Good foveal reflex, Microaneurysms -- slightly improved, trace cystic changes temporal macula -- improved   Vessels +fibrosis / ?NV temporal periphery-- regressing, mild attenuation, mild tortuosity trace focal fibrosis along distal IT arcades, attenuated, mild tortuosity   Periphery Attached, 360 PRP with good laser fill in, rare DBH temporal periphery -- improved Attached, 360 PRP with good  fill in, rare IRH/DBH           Refraction     Wearing Rx       Sphere Cylinder Axis   Right -1.75 +0.75 146  Left -1.50 +0.25 162           IMAGING AND PROCEDURES  Imaging and Procedures for 04/11/2024  OCT, Retina - OU - Both Eyes        Right Eye Quality was good. Central Foveal Thickness: 352. Progression has improved. Findings include normal foveal contour, no SRF, intraretinal fluid, vitreomacular adhesion (Trace cystic changes nasal macula -- improved, partial PVD).   Left Eye Quality was good. Central Foveal Thickness: 354. Progression has improved. Findings include normal foveal contour, no IRF, no SRF, vitreomacular adhesion (Trace cystic changes temporal mac -- improved -- seen best on widefield).   Notes  *Images captured and stored on drive  Diagnosis / Impression:  +DME OU OD: Trace cystic changes nasal macula -- improved, partial PVD OS: Trace cystic changes temporal mac -- improved -- seen best on widefield  Clinical management:  See below  Abbreviations: NFP - Normal foveal profile. CME - cystoid macular edema. PED - pigment epithelial detachment. IRF - intraretinal fluid. SRF - subretinal fluid. EZ - ellipsoid zone. ERM - epiretinal membrane. ORA - outer retinal atrophy. ORT - outer retinal tubulation. SRHM - subretinal hyper-reflective material. IRHM - intraretinal hyper-reflective material      Intravitreal Injection, Pharmacologic Agent - OD - Right Eye       Time Out 04/11/2024. 10:20 AM. Confirmed correct patient, procedure, site, and patient consented.   Anesthesia Topical anesthesia was used. Anesthetic medications included Lidocaine 2%, Proparacaine 0.5%.   Procedure Preparation included 5% betadine to ocular surface, eyelid speculum. A (32g) needle was used.   Injection: 1.25 mg Bevacizumab  1.25mg /0.71ml   Route: Intravitreal, Site: Right Eye   NDC: C2662926, Lot: [id number], Expiration date: 07/31/2024   Post-op Post  injection exam found visual acuity of at least counting fingers. The patient tolerated the procedure well. There were no complications. The patient received written and verbal post procedure care education. Post injection medications were not given.      Intravitreal Injection, Pharmacologic Agent - OS - Left Eye       Time Out 04/11/2024. 10:20 AM. Confirmed correct patient, procedure, site, and patient consented.   Anesthesia Topical anesthesia was used. Anesthetic medications included Lidocaine 2%, Proparacaine 0.5%.   Procedure Preparation included 5% betadine to ocular surface, eyelid speculum. A (33g) needle was used.   Injection: 1.25 mg Bevacizumab  1.25mg /0.86ml   Route: Intravitreal, Site: Left Eye   NDC: C2662926, Lot: [id number], Expiration date: 07/01/2024   Post-op Post injection exam found visual acuity of at least counting fingers. The patient tolerated the procedure well. There were no complications. The patient received written and verbal post procedure care education. Post injection medications were not given.            ASSESSMENT/PLAN:   ICD-10-CM   1. Proliferative diabetic retinopathy of both eyes with macular edema associated with type 2 diabetes mellitus (HCC)  E11.3513 OCT, Retina - OU - Both Eyes    Intravitreal Injection, Pharmacologic Agent - OD - Right Eye    Intravitreal Injection, Pharmacologic Agent - OS - Left Eye    Bevacizumab  (AVASTIN ) SOLN 1.25 mg    Bevacizumab  (AVASTIN ) SOLN 1.25 mg    2. Long term (current) use of oral hypoglycemic drugs  Z79.84     3. Long-term (current) use of injectable non-insulin  antidiabetic drugs  Z79.85     4. Essential hypertension  I10     5. Hypertensive retinopathy of both eyes  H35.033     6. Combined  forms of age-related cataract of both eyes  H25.813      1-3. 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, OU (OD > OS)  - delayed f/u - 14 wks instead of 12 due to weather on 02.07.25  - delayed f/u -  15.5 wks instead of 12 due to work schedule on 08.07.24  - last A1c  6.4 on 10.25.24 - pt with history of PRP laser OU in [place] ~10+ yrs ago -- pt can't remember provider - exam shows scattered MA/IRH and 360 PRP, room for fill in OU - FA (9.19.22) shows late-leaking MA, vascular nonperfusion, +NV OU -- will need PRP fill in OU - s/p PRP OD (09.29.22) - s/p PRP OS (11.14.22) - s/p IVA OD #1 (09.19.22) - s/p IVA OS #1 (09.23.22) - s/p IVA OU #2 (10.21.22), #3 (12.23.22), #4 (01.06.23), #5 (02.03.23), #6 (03.03.23), #7 (04.07.23), #8 (05.19.23), #9 (07.14.23), #10 (09.01.23), #11 (10.20.23) #12 (12.08.23), #13 (02.09.24), 14 (04.19.24), #15 (08.07.24), #16 (11.01.24), #17 (02.07.25) **interval worsening of IRF at 8 weeks noted on 07.14.23 exam** - BCVA OD: 20/25 -- stable, OS: 20/20 -- stable - OCT shows OD: Trace cystic changes nasal macula -- improved, partial PVD; OS: Trace cystic changes temporal mac -- improved -- seen best on widefield at 14 weeks - recommend IVA OU #18 today, 05.16.25 for DME with f/u at 14 weeks again - pt wishes to proceed - RBA of procedure discussed, questions answered - informed consent obtained and signed  - see procedure notes - f/u 14 weeks DFE/OCT, possible injection(s)  4,5. Hypertensive retinopathy OU - discussed importance of tight BP control -  monitor for now  6. Mixed Cataract OU - The symptoms of cataract, surgical options, and treatments and risks were discussed with patient. - discussed diagnosis and progression - monitor  Ophthalmic Meds Ordered this visit:  Meds ordered this encounter  Medications   Bevacizumab  (AVASTIN ) SOLN 1.25 mg   Bevacizumab  (AVASTIN ) SOLN 1.25 mg     Return in about 14 weeks (around 07/18/2024) for f/u PDR OU, DFE, OCT, Possible Injxn.  There are no Patient Instructions on file for this visit.   This document serves as a record of services personally performed by Jeanice Millard, MD, PhD. It was created  on their behalf by Eller Gut COT, an ophthalmic technician. The creation of this record is the provider's dictation and/or activities during the visit.    Electronically signed by: Eller Gut COT 05.15.2025 12:01 AM  This document serves as a record of services personally performed by Jeanice Millard, MD, PhD. It was created on their behalf by Morley Arabia. Bevin Bucks, OA an ophthalmic technician. The creation of this record is the provider's dictation and/or activities during the visit.    Electronically signed by: Morley Arabia. Bevin Bucks, OA 04/16/24 12:01 AM  Jeanice Millard, M.D., Ph.D. Diseases & Surgery of the Retina and Vitreous [facility] 04/11/2024   I have reviewed the above documentation for accuracy and completeness, and I agree with the above. Jeanice Millard, M.D., Ph.D. 04/16/24 12:02 AM   Abbreviations: M myopia (nearsighted); A astigmatism; H hyperopia (farsighted); P presbyopia; Mrx spectacle prescription;  CTL contact lenses; OD right eye; OS left eye; OU both eyes  XT exotropia; ET esotropia; PEK punctate epithelial keratitis; PEE punctate epithelial erosions; DES dry eye syndrome; MGD meibomian gland dysfunction; ATs artificial tears; PFAT's preservative free artificial tears; NSC nuclear sclerotic cataract; PSC posterior subcapsular cataract; ERM epi-retinal membrane; PVD posterior  vitreous detachment; RD retinal detachment; DM diabetes mellitus; DR diabetic retinopathy; NPDR non-proliferative diabetic retinopathy; PDR proliferative diabetic retinopathy; CSME clinically significant macular edema; DME diabetic macular edema; dbh dot blot hemorrhages; CWS cotton wool spot; POAG primary open angle glaucoma; C/D cup-to-disc ratio; HVF humphrey visual field; GVF goldmann visual field; OCT optical coherence tomography; IOP intraocular pressure; BRVO Branch retinal vein occlusion; CRVO central retinal vein occlusion; CRAO central retinal artery occlusion; BRAO  branch retinal artery occlusion; RT retinal tear; SB scleral buckle; PPV pars plana vitrectomy; VH Vitreous hemorrhage; PRP panretinal laser photocoagulation; IVK intravitreal kenalog ; VMT vitreomacular traction; MH Macular hole;  NVD neovascularization of the disc; NVE neovascularization elsewhere; AREDS age related eye disease study; ARMD age related macular degeneration; POAG primary open angle glaucoma; EBMD epithelial/anterior basement membrane dystrophy; ACIOL anterior chamber intraocular lens; IOL intraocular lens; PCIOL posterior chamber intraocular lens; Phaco/IOL phacoemulsification with intraocular lens placement; PRK photorefractive keratectomy; LASIK laser assisted in situ keratomileusis; HTN hypertension; DM diabetes mellitus; COPD chronic obstructive pulmonary disease

## 2024-04-11 ENCOUNTER — Encounter (INDEPENDENT_AMBULATORY_CARE_PROVIDER_SITE_OTHER): Payer: Self-pay | Admitting: Ophthalmology

## 2024-04-11 ENCOUNTER — Ambulatory Visit (INDEPENDENT_AMBULATORY_CARE_PROVIDER_SITE_OTHER): Payer: No Typology Code available for payment source | Admitting: Ophthalmology

## 2024-04-11 DIAGNOSIS — Z7984 Long term (current) use of oral hypoglycemic drugs: Secondary | ICD-10-CM

## 2024-04-11 DIAGNOSIS — H25813 Combined forms of age-related cataract, bilateral: Secondary | ICD-10-CM

## 2024-04-11 DIAGNOSIS — I10 Essential (primary) hypertension: Secondary | ICD-10-CM

## 2024-04-11 DIAGNOSIS — H35033 Hypertensive retinopathy, bilateral: Secondary | ICD-10-CM

## 2024-04-11 DIAGNOSIS — Z7985 Long-term (current) use of injectable non-insulin antidiabetic drugs: Secondary | ICD-10-CM | POA: Diagnosis not present

## 2024-04-11 DIAGNOSIS — E113513 Type 2 diabetes mellitus with proliferative diabetic retinopathy with macular edema, bilateral: Secondary | ICD-10-CM | POA: Diagnosis not present

## 2024-04-11 MED ORDER — BEVACIZUMAB CHEMO INJECTION 1.25MG/0.05ML SYRINGE FOR KALEIDOSCOPE
1.2500 mg | INTRAVITREAL | Status: AC | PRN
Start: 2024-04-11 — End: 2024-04-11
  Administered 2024-04-11: 1.25 mg via INTRAVITREAL

## 2024-04-11 MED ORDER — BEVACIZUMAB CHEMO INJECTION 1.25MG/0.05ML SYRINGE FOR KALEIDOSCOPE
1.2500 mg | INTRAVITREAL | Status: AC | PRN
  Administered 2024-04-11: 1.25 mg via INTRAVITREAL

## 2024-04-13 ENCOUNTER — Other Ambulatory Visit: Payer: Self-pay | Admitting: Family Medicine

## 2024-04-13 DIAGNOSIS — I152 Hypertension secondary to endocrine disorders: Secondary | ICD-10-CM

## 2024-04-23 ENCOUNTER — Other Ambulatory Visit: Payer: Self-pay | Admitting: Family Medicine

## 2024-04-23 DIAGNOSIS — I10 Essential (primary) hypertension: Secondary | ICD-10-CM

## 2024-04-23 DIAGNOSIS — E119 Type 2 diabetes mellitus without complications: Secondary | ICD-10-CM

## 2024-06-11 ENCOUNTER — Other Ambulatory Visit: Payer: Self-pay | Admitting: Family Medicine

## 2024-07-18 ENCOUNTER — Encounter (INDEPENDENT_AMBULATORY_CARE_PROVIDER_SITE_OTHER): Admitting: Ophthalmology

## 2024-07-23 NOTE — Progress Notes (Shared)
 [facility] - Clinic Note  07/25/2024     CHIEF COMPLAINT Patient presents for No chief complaint on file.   HISTORY OF PRESENT ILLNESS: Edward Riley is a 58 y.o. male who presents to the clinic today for:      Patient states his vision is stable, he went to the dr about 2 weeks ago and his A1c was 6.2, his BP was good, he is about to stop metformin   Referring physician: Alvia Bring, DO [address]  [address] [place],  [place] [address]  HISTORICAL INFORMATION:   Selected notes from the MEDICAL RECORD NUMBER Referred by Dr. Bring Alvia for diabetic eye exam LEE: [facility] Ocular Hx- PMH-    CURRENT MEDICATIONS: No current outpatient medications on file. (Ophthalmic Drugs)   No current facility-administered medications for this visit. (Ophthalmic Drugs)   Current Outpatient Medications (Other)  Medication Sig   AMBULATORY NON FORMULARY MEDICATION Bayer test strips and lancets. Use to check blood sugar twice a day. Dx: Type Two Diabetes Mellitus   amLODipine  (NORVASC ) 10 MG tablet TAKE 1 TABLET BY MOUTH DAILY   BD PEN NEEDLE NANO U/F 32G X 4 MM MISC USE AS DIRECTED   Blood Glucose Monitoring Suppl (ONE TOUCH ULTRA 2) w/Device KIT Use to check blood sugar twice a day. Send strips and lancets, quantity 100 refills 3.  Dx: Type Two Diabetes Mellitus   dapagliflozin  propanediol (FARXIGA ) 10 MG TABS tablet Take 1 tablet (10 mg total) by mouth daily before breakfast.   glucose blood (ONE TOUCH ULTRA TEST) test strip Use as instructed   hydrochlorothiazide  (HYDRODIURIL ) 25 MG tablet TAKE 1 TABLET BY MOUTH DAILY   Insulin  Pen Needle (PEN NEEDLES) 31G X 8 MM MISC Use the pen needles for both the insulin  and victoza  pens daily   Lancet Device MISC Check blood sugar once or twice daily as needed.   lisinopril  (ZESTRIL ) 20 MG tablet TAKE 1 TABLET BY MOUTH EVERY MORNING   MOUNJARO  15 MG/0.5ML Pen INJECT 15 MG UNDER THE SKIN ONCE  WEEKLY   pioglitazone  (ACTOS ) 45 MG tablet Take 1 tablet (45 mg total) by mouth daily.   rosuvastatin  (CRESTOR ) 20 MG tablet TAKE 1 TABLET BY MOUTH DAILY   sildenafil  (VIAGRA ) 100 MG tablet TAKE 1/2 TO 1 TABLET BY MOUTH DAILY AS NEEDED   No current facility-administered medications for this visit. (Other)   REVIEW OF SYSTEMS:   ALLERGIES No Known Allergies  PAST MEDICAL HISTORY Past Medical History:  Diagnosis Date   Diabetes mellitus without complication (HCC)    Hyperlipidemia    Hypertension    Obese    Retinal hemorrhage, both eyes    Sessile colonic polyp 02/04/2019   Colonoscopy February 2020.   No past surgical history on file.  FAMILY HISTORY Family History  Problem Relation Age of Onset   Arthritis Mother    Hyperlipidemia Mother    Hypertension Mother    Cancer Father 61       Prostate (Metastatic) Lung, Esophagus, Stomach   Alcohol abuse Father    Kidney disease Brother    Hypertension Brother    Cancer Paternal Aunt        Breast Cancer   Alzheimer's disease Maternal Grandmother    Heart disease Paternal Grandmother    Diabetes Brother    Alcohol abuse Maternal Uncle    SOCIAL HISTORY Social History   Tobacco Use   Smoking status: Never   Smokeless tobacco: Never  Vaping Use   Vaping status: Never Used  Substance Use Topics   Alcohol use: Yes    Comment: Rarely   Drug use: No       OPHTHALMIC EXAM: Not recorded    IMAGING AND PROCEDURES  Imaging and Procedures for 07/25/2024          ASSESSMENT/PLAN: No diagnosis found.  1-3. 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, OU (OD > OS)  - delayed f/u - 14 wks instead of 12 due to weather on 02.07.25  - delayed f/u - 15.5 wks instead of 12 due to work schedule on 08.07.24  - last A1c  6.4 on 10.25.24 - pt with history of PRP laser OU in [place] ~10+ yrs ago -- pt can't remember provider - exam shows scattered MA/IRH and 360 PRP, room for fill in OU - FA (9.19.22) shows  late-leaking MA, vascular nonperfusion, +NV OU -- will need PRP fill in OU - s/p PRP OD (09.29.22) - s/p PRP OS (11.14.22) - s/p IVA OD #1 (09.19.22) - s/p IVA OS #1 (09.23.22) - s/p IVA OU #2 (10.21.22), #3 (12.23.22), #4 (01.06.23), #5 (02.03.23), #6 (03.03.23), #7 (04.07.23), #8 (05.19.23), #9 (07.14.23), #10 (09.01.23), #11 (10.20.23) #12 (12.08.23), #13 (02.09.24), 14 (04.19.24), #15 (08.07.24), #16 (11.01.24), #17 (02.07.25) #18(05.16.25) **interval worsening of IRF at 8 weeks noted on 07.14.23 exam** - BCVA OD: 20/25 -- stable, OS: 20/20 -- stable - OCT shows OD: Trace cystic changes nasal macula -- improved, partial PVD; OS: Trace cystic changes temporal mac -- improved -- seen best on widefield at 14 weeks - recommend IVA OU #19 today, 08.29.25 for DME with f/u at 14 weeks again - pt wishes to proceed - RBA of procedure discussed, questions answered - informed consent obtained and signed  - see procedure notes - f/u 14 weeks DFE/OCT, possible injection(s)  4,5. Hypertensive retinopathy OU - discussed importance of tight BP control -  monitor for now  6. Mixed Cataract OU - The symptoms of cataract, surgical options, and treatments and risks were discussed with patient. - discussed diagnosis and progression - monitor  Ophthalmic Meds Ordered this visit:  No orders of the defined types were placed in this encounter.    No follow-ups on file.  There are no Patient Instructions on file for this visit.   This document serves as a record of services personally performed by Redell JUDITHANN Hans, MD, PhD. It was created on their behalf by Delon Newness COT, an ophthalmic technician. The creation of this record is the provider's dictation and/or activities during the visit.    Electronically signed by: Delon Newness COT 08.27.2025 9:59 AM   Abbreviations: M myopia (nearsighted); A astigmatism; H hyperopia (farsighted); P presbyopia; Mrx spectacle prescription;  CTL  contact lenses; OD right eye; OS left eye; OU both eyes  XT exotropia; ET esotropia; PEK punctate epithelial keratitis; PEE punctate epithelial erosions; DES dry eye syndrome; MGD meibomian gland dysfunction; ATs artificial tears; PFAT's preservative free artificial tears; NSC nuclear sclerotic cataract; PSC posterior subcapsular cataract; ERM epi-retinal membrane; PVD posterior vitreous detachment; RD retinal detachment; DM diabetes mellitus; DR diabetic retinopathy; NPDR non-proliferative diabetic retinopathy; PDR proliferative diabetic retinopathy; CSME clinically significant macular edema; DME diabetic macular edema; dbh dot blot hemorrhages; CWS cotton wool spot; POAG primary open angle glaucoma; C/D cup-to-disc ratio; HVF humphrey visual field; GVF goldmann visual field; OCT optical coherence tomography; IOP intraocular pressure; BRVO Branch retinal vein occlusion; CRVO central retinal vein occlusion; CRAO central retinal artery occlusion; BRAO branch  retinal artery occlusion; RT retinal tear; SB scleral buckle; PPV pars plana vitrectomy; VH Vitreous hemorrhage; PRP panretinal laser photocoagulation; IVK intravitreal kenalog ; VMT vitreomacular traction; MH Macular hole;  NVD neovascularization of the disc; NVE neovascularization elsewhere; AREDS age related eye disease study; ARMD age related macular degeneration; POAG primary open angle glaucoma; EBMD epithelial/anterior basement membrane dystrophy; ACIOL anterior chamber intraocular lens; IOL intraocular lens; PCIOL posterior chamber intraocular lens; Phaco/IOL phacoemulsification with intraocular lens placement; PRK photorefractive keratectomy; LASIK laser assisted in situ keratomileusis; HTN hypertension; DM diabetes mellitus; COPD chronic obstructive pulmonary disease

## 2024-07-25 ENCOUNTER — Encounter (INDEPENDENT_AMBULATORY_CARE_PROVIDER_SITE_OTHER): Admitting: Ophthalmology

## 2024-07-30 NOTE — Progress Notes (Signed)
 [facility] - Clinic Note  08/01/2024     CHIEF COMPLAINT Patient presents for Retina Follow Up   HISTORY OF PRESENT ILLNESS: Edward Riley is a 58 y.o. male who presents to the clinic today for:   HPI     Retina Follow Up   Patient presents with  Diabetic Retinopathy.  In both eyes.  This started 3 years ago.  Severity is moderate.  Duration of 17 weeks.  Since onset it is stable.  I, the attending physician,  performed the HPI with the patient and updated documentation appropriately.        Comments   Pt denies and changes in vision or concerns with eyes. Pt states floaters have been the same, no FOL, no pain, no ATS.      Last edited by Valdemar Rogue, MD on 08/04/2024  6:27 PM.      Patient feels the vision is the same.   Referring physician: Alvia Bring, DO [address]  [address] [place],  [place] [address]  HISTORICAL INFORMATION:   Selected notes from the MEDICAL RECORD NUMBER Referred by Dr. Bring Alvia for diabetic eye exam LEE: [facility] Ocular Hx- PMH-    CURRENT MEDICATIONS: No current outpatient medications on file. (Ophthalmic Drugs)   No current facility-administered medications for this visit. (Ophthalmic Drugs)   Current Outpatient Medications (Other)  Medication Sig   AMBULATORY NON FORMULARY MEDICATION Bayer test strips and lancets. Use to check blood sugar twice a day. Dx: Type Two Diabetes Mellitus   amLODipine  (NORVASC ) 10 MG tablet TAKE 1 TABLET BY MOUTH DAILY   BD PEN NEEDLE NANO U/F 32G X 4 MM MISC USE AS DIRECTED   Blood Glucose Monitoring Suppl (ONE TOUCH ULTRA 2) w/Device KIT Use to check blood sugar twice a day. Send strips and lancets, quantity 100 refills 3.  Dx: Type Two Diabetes Mellitus   dapagliflozin  propanediol (FARXIGA ) 10 MG TABS tablet Take 1 tablet (10 mg total) by mouth daily before breakfast.   glucose blood (ONE TOUCH ULTRA TEST) test strip Use as instructed    hydrochlorothiazide  (HYDRODIURIL ) 25 MG tablet TAKE 1 TABLET BY MOUTH DAILY   Insulin  Pen Needle (PEN NEEDLES) 31G X 8 MM MISC Use the pen needles for both the insulin  and victoza  pens daily   Lancet Device MISC Check blood sugar once or twice daily as needed.   lisinopril  (ZESTRIL ) 20 MG tablet TAKE 1 TABLET BY MOUTH EVERY MORNING   MOUNJARO  15 MG/0.5ML Pen INJECT 15 MG UNDER THE SKIN ONCE WEEKLY   pioglitazone  (ACTOS ) 45 MG tablet Take 1 tablet (45 mg total) by mouth daily.   rosuvastatin  (CRESTOR ) 20 MG tablet TAKE 1 TABLET BY MOUTH DAILY   sildenafil  (VIAGRA ) 100 MG tablet TAKE 1/2 TO 1 TABLET BY MOUTH DAILY AS NEEDED   No current facility-administered medications for this visit. (Other)   REVIEW OF SYSTEMS: ROS   Positive for: Endocrine, Cardiovascular, Eyes Negative for: Constitutional, Gastrointestinal, Neurological, Skin, Genitourinary, Musculoskeletal, HENT, Respiratory, Psychiatric, Allergic/Imm, Heme/Lymph Last edited by Elnor Avelina RAMAN, COT on 08/01/2024  7:52 AM.     ALLERGIES No Known Allergies  PAST MEDICAL HISTORY Past Medical History:  Diagnosis Date   Diabetes mellitus without complication (HCC)    Hyperlipidemia    Hypertension    Obese    Retinal hemorrhage, both eyes    Sessile colonic polyp 02/04/2019   Colonoscopy February 2020.   History reviewed. No pertinent surgical history.  FAMILY HISTORY Family History  Problem Relation Age of Onset   Arthritis Mother    Hyperlipidemia Mother    Hypertension Mother    Cancer Father 68       Prostate (Metastatic) Lung, Esophagus, Stomach   Alcohol abuse Father    Kidney disease Brother    Hypertension Brother    Cancer Paternal Aunt        Breast Cancer   Alzheimer's disease Maternal Grandmother    Heart disease Paternal Grandmother    Diabetes Brother    Alcohol abuse Maternal Uncle    SOCIAL HISTORY Social History   Tobacco Use   Smoking status: Never   Smokeless tobacco: Never  Vaping Use    Vaping status: Never Used  Substance Use Topics   Alcohol use: Yes    Comment: Rarely   Drug use: No       OPHTHALMIC EXAM: Base Eye Exam     Visual Acuity (Snellen - Linear)       Right Left   Dist cc 20/25 20/25 -2   Dist ph cc NI 20/20 -2    Correction: Glasses         Tonometry (Tonopen, 7:58 AM)       Right Left   Pressure 14 16         Pupils       Pupils Dark Light Shape React APD   Right PERRL 3 2 Round Brisk None   Left PERRL 3 2 Round Brisk None         Visual Fields       Left Right    Full Full         Extraocular Movement       Right Left    Full, Ortho Full, Ortho         Neuro/Psych     Oriented x3: Yes   Mood/Affect: Normal         Dilation     Both eyes: 1.0% Mydriacyl, 2.5% Phenylephrine @ 7:59 AM           Slit Lamp and Fundus Exam     Slit Lamp Exam       Right Left   Lids/Lashes Dermatochalasis - upper lid Dermatochalasis - upper lid   Conjunctiva/Sclera nasal pingeucula nasal and temporal pinguecula   Cornea trace PEE trace PEE   Anterior Chamber Deep and quiet Deep and quiet   Iris Round and dilated, No NVI Round and dilated, No NVI   Lens 2+ Nuclear sclerosis, 2+ Cortical cataract 2+ Nuclear sclerosis, 2+ Cortical cataract   Anterior Vitreous Mild Vitreous syneresis Mild Vitreous syneresis         Fundus Exam       Right Left   Disc Pink and Sharp, PPA, Compact mild tilt, Pink and Sharp, PPA   C/D Ratio 0.2 0.2   Macula good foveal reflex, trace MA, no exudates, trace perifoveal cystic changes -- slightly increased Good foveal reflex, Microaneurysms -- slightly improved, trace cystic changes temporal macula   Vessels +fibrosis / ?NV temporal periphery-- regressing, mild attenuation, mild tortuosity trace focal fibrosis along distal IT arcades, attenuated, mild tortuosity   Periphery Attached, 360 PRP with good laser fill in, rare DBH temporal periphery -- improved Attached, 360 PRP with good fill  in, rare IRH/DBH           Refraction     Wearing Rx       Sphere Cylinder Axis   Right -  1.75 +0.75 146   Left -1.50 +0.25 162           IMAGING AND PROCEDURES  Imaging and Procedures for 08/01/2024  OCT, Retina - OU - Both Eyes       Right Eye Quality was good. Central Foveal Thickness: 369. Progression has worsened. Findings include normal foveal contour, no SRF, intraretinal fluid, vitreomacular adhesion (Trace cystic changes nasal macula and IN fovea -- slightly increase, partial PVD).   Left Eye Quality was good. Central Foveal Thickness: 351. Progression has worsened. Findings include normal foveal contour, no SRF, vitreomacular adhesion (Trace cystic changes temporal mac -- slightly increased).   Notes *Images captured and stored on drive  Diagnosis / Impression:  +DME OU OD: Trace cystic changes nasal macula and IN fovea -- slightly increase, partial PVD OS: Trace cystic changes temporal mac -- slightly increased  Clinical management:  See below  Abbreviations: NFP - Normal foveal profile. CME - cystoid macular edema. PED - pigment epithelial detachment. IRF - intraretinal fluid. SRF - subretinal fluid. EZ - ellipsoid zone. ERM - epiretinal membrane. ORA - outer retinal atrophy. ORT - outer retinal tubulation. SRHM - subretinal hyper-reflective material. IRHM - intraretinal hyper-reflective material      Intravitreal Injection, Pharmacologic Agent - OD - Right Eye       Time Out 08/01/2024. 8:11 AM. Confirmed correct patient, procedure, site, and patient consented.   Anesthesia Topical anesthesia was used. Anesthetic medications included Lidocaine 2%, Proparacaine 0.5%.   Procedure Preparation included 5% betadine to ocular surface, eyelid speculum. A supplied (32g) needle was used.   Injection: 1.25 mg Bevacizumab  1.25mg /0.69ml   Route: Intravitreal, Site: Right Eye   NDC: H525437, Lot: [id number], Expiration date: 08/30/2024   Post-op Post  injection exam found visual acuity of at least counting fingers. The patient tolerated the procedure well. There were no complications. The patient received written and verbal post procedure care education. Post injection medications were not given.      Intravitreal Injection, Pharmacologic Agent - OS - Left Eye       Time Out 08/01/2024. 8:12 AM. Confirmed correct patient, procedure, site, and patient consented.   Anesthesia Topical anesthesia was used. Anesthetic medications included Lidocaine 2%, Proparacaine 0.5%.   Procedure Preparation included 5% betadine to ocular surface, eyelid speculum. A (33g) needle was used.   Injection: 1.25 mg Bevacizumab  1.25mg /0.109ml   Route: Intravitreal, Site: Left Eye   NDC: H525437, Lot: [id number], Expiration date: 10/23/2024   Post-op Post injection exam found visual acuity of at least counting fingers. The patient tolerated the procedure well. There were no complications. The patient received written and verbal post procedure care education. Post injection medications were not given.            ASSESSMENT/PLAN:   ICD-10-CM   1. Proliferative diabetic retinopathy of both eyes with macular edema associated with type 2 diabetes mellitus (HCC)  E11.3513 OCT, Retina - OU - Both Eyes    Intravitreal Injection, Pharmacologic Agent - OD - Right Eye    Intravitreal Injection, Pharmacologic Agent - OS - Left Eye    Bevacizumab  (AVASTIN ) SOLN 1.25 mg    Bevacizumab  (AVASTIN ) SOLN 1.25 mg    2. Long term (current) use of oral hypoglycemic drugs  Z79.84     3. Long-term (current) use of injectable non-insulin  antidiabetic drugs  Z79.85     4. Essential hypertension  I10     5. Hypertensive retinopathy of both eyes  H35.033  6. Combined forms of age-related cataract of both eyes  H25.813      1-3. 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, OU (OD > OS)  - delayed f/u - 16 wks instead of 14 due to work travel  - delayed f/u - 14 wks  instead of 12 due to weather on 02.07.25 - delayed f/u - 15.5 wks instead of 12 due to work schedule on 08.07.24  - last A1c 6.4 on 10.25.24 - pt with history of PRP laser OU in [place] ~10+ yrs ago -- pt can't remember provider - exam shows scattered MA/IRH and 360 PRP, room for fill in OU - FA (9.19.22) shows late-leaking MA, vascular nonperfusion, +NV OU -- will need PRP fill in OU - s/p PRP OD (09.29.22) - s/p PRP OS (11.14.22) - s/p IVA OD #1 (09.19.22) - s/p IVA OS #1 (09.23.22) - s/p IVA OU #2 (10.21.22), #3 (12.23.22), #4 (01.06.23), #5 (02.03.23), #6 (03.03.23), #7 (04.07.23), #8 (05.19.23), #9 (07.14.23), #10 (09.01.23), #11 (10.20.23) #12 (12.08.23), #13 (02.09.24), 14 (04.19.24), #15 (08.07.24), #16 (11.01.24), #17 (02.07.25) #18 (05.16.25) **interval worsening of IRF at 8 weeks noted on 07.14.23 exam** - BCVA OD: 20/25 -- stable, OS: 20/20 -- stable - OCT shows OD: OD: Trace cystic changes nasal macula and IN fovea -- slightly increase, partial PVD, OS: Trace cystic changes temporal mac -- slightly increased at 16 weeks - recommend IVA OU #19 today, 09.05.25 for DME with f/u in 14-16 weeks - pt wishes to proceed - RBA of procedure discussed, questions answered - informed consent obtained and signed  - see procedure notes - f/u 14-16 weeks DFE/OCT, possible injection(s)  4,5. Hypertensive retinopathy OU - discussed importance of tight BP control -  monitor for now  6. Mixed Cataract OU - The symptoms of cataract, surgical options, and treatments and risks were discussed with patient. - discussed diagnosis and progression - monitor  Ophthalmic Meds Ordered this visit:  Meds ordered this encounter  Medications   Bevacizumab  (AVASTIN ) SOLN 1.25 mg   Bevacizumab  (AVASTIN ) SOLN 1.25 mg     Return in about 16 weeks (around 11/21/2024) for f/u PDR OU , DFE, OCT, Possible, IVA, OU.  There are no Patient Instructions on file for this visit.   This document serves as  a record of services personally performed by Redell JUDITHANN Hans, MD, PhD. It was created on their behalf by Delon Newness COT, an ophthalmic technician. The creation of this record is the provider's dictation and/or activities during the visit.    Electronically signed by: Delon Newness COT 09.03.2025 6:28 PM  This document serves as a record of services personally performed by Redell JUDITHANN Hans, MD, PhD. It was created on their behalf by Wanda GEANNIE Keens, COT an ophthalmic technician. The creation of this record is the provider's dictation and/or activities during the visit.    Electronically signed by:  Wanda GEANNIE Keens, COT  08/04/24 6:28 PM  Redell JUDITHANN Hans, M.D., Ph.D. Diseases & Surgery of the Retina and Vitreous [facility] 08/01/2024   I have reviewed the above documentation for accuracy and completeness, and I agree with the above. Redell JUDITHANN Hans, M.D., Ph.D. 08/04/24 6:33 PM   Abbreviations: M myopia (nearsighted); A astigmatism; H hyperopia (farsighted); P presbyopia; Mrx spectacle prescription;  CTL contact lenses; OD right eye; OS left eye; OU both eyes  XT exotropia; ET esotropia; PEK punctate epithelial keratitis; PEE punctate epithelial erosions; DES dry eye syndrome; MGD meibomian gland dysfunction; ATs  artificial tears; PFAT's preservative free artificial tears; NSC nuclear sclerotic cataract; PSC posterior subcapsular cataract; ERM epi-retinal membrane; PVD posterior vitreous detachment; RD retinal detachment; DM diabetes mellitus; DR diabetic retinopathy; NPDR non-proliferative diabetic retinopathy; PDR proliferative diabetic retinopathy; CSME clinically significant macular edema; DME diabetic macular edema; dbh dot blot hemorrhages; CWS cotton wool spot; POAG primary open angle glaucoma; C/D cup-to-disc ratio; HVF humphrey visual field; GVF goldmann visual field; OCT optical coherence tomography; IOP intraocular pressure; BRVO Branch retinal  vein occlusion; CRVO central retinal vein occlusion; CRAO central retinal artery occlusion; BRAO branch retinal artery occlusion; RT retinal tear; SB scleral buckle; PPV pars plana vitrectomy; VH Vitreous hemorrhage; PRP panretinal laser photocoagulation; IVK intravitreal kenalog ; VMT vitreomacular traction; MH Macular hole;  NVD neovascularization of the disc; NVE neovascularization elsewhere; AREDS age related eye disease study; ARMD age related macular degeneration; POAG primary open angle glaucoma; EBMD epithelial/anterior basement membrane dystrophy; ACIOL anterior chamber intraocular lens; IOL intraocular lens; PCIOL posterior chamber intraocular lens; Phaco/IOL phacoemulsification with intraocular lens placement; PRK photorefractive keratectomy; LASIK laser assisted in situ keratomileusis; HTN hypertension; DM diabetes mellitus; COPD chronic obstructive pulmonary disease

## 2024-08-01 ENCOUNTER — Encounter (INDEPENDENT_AMBULATORY_CARE_PROVIDER_SITE_OTHER): Payer: Self-pay | Admitting: Ophthalmology

## 2024-08-01 ENCOUNTER — Ambulatory Visit (INDEPENDENT_AMBULATORY_CARE_PROVIDER_SITE_OTHER): Admitting: Ophthalmology

## 2024-08-01 DIAGNOSIS — E113513 Type 2 diabetes mellitus with proliferative diabetic retinopathy with macular edema, bilateral: Secondary | ICD-10-CM

## 2024-08-01 DIAGNOSIS — H25813 Combined forms of age-related cataract, bilateral: Secondary | ICD-10-CM

## 2024-08-01 DIAGNOSIS — Z7984 Long term (current) use of oral hypoglycemic drugs: Secondary | ICD-10-CM | POA: Diagnosis not present

## 2024-08-01 DIAGNOSIS — Z7985 Long-term (current) use of injectable non-insulin antidiabetic drugs: Secondary | ICD-10-CM | POA: Diagnosis not present

## 2024-08-01 DIAGNOSIS — I10 Essential (primary) hypertension: Secondary | ICD-10-CM | POA: Diagnosis not present

## 2024-08-01 DIAGNOSIS — H35033 Hypertensive retinopathy, bilateral: Secondary | ICD-10-CM

## 2024-08-01 MED ORDER — BEVACIZUMAB CHEMO INJECTION 1.25MG/0.05ML SYRINGE FOR KALEIDOSCOPE
1.2500 mg | INTRAVITREAL | Status: AC | PRN
  Administered 2024-08-01: 1.25 mg via INTRAVITREAL

## 2024-08-11 ENCOUNTER — Other Ambulatory Visit: Payer: Self-pay | Admitting: Family Medicine

## 2024-09-19 ENCOUNTER — Ambulatory Visit: Admitting: Family Medicine

## 2024-09-19 ENCOUNTER — Encounter: Payer: Self-pay | Admitting: Family Medicine

## 2024-09-19 VITALS — BP 141/68 | HR 83 | Ht 68.0 in | Wt 229.0 lb

## 2024-09-19 DIAGNOSIS — I10 Essential (primary) hypertension: Secondary | ICD-10-CM

## 2024-09-19 DIAGNOSIS — E119 Type 2 diabetes mellitus without complications: Secondary | ICD-10-CM | POA: Diagnosis not present

## 2024-09-19 DIAGNOSIS — E1121 Type 2 diabetes mellitus with diabetic nephropathy: Secondary | ICD-10-CM

## 2024-09-19 DIAGNOSIS — Z23 Encounter for immunization: Secondary | ICD-10-CM

## 2024-09-19 DIAGNOSIS — E1159 Type 2 diabetes mellitus with other circulatory complications: Secondary | ICD-10-CM

## 2024-09-19 DIAGNOSIS — E785 Hyperlipidemia, unspecified: Secondary | ICD-10-CM

## 2024-09-19 DIAGNOSIS — Z7985 Long-term (current) use of injectable non-insulin antidiabetic drugs: Secondary | ICD-10-CM

## 2024-09-19 DIAGNOSIS — Z125 Encounter for screening for malignant neoplasm of prostate: Secondary | ICD-10-CM

## 2024-09-19 DIAGNOSIS — I152 Hypertension secondary to endocrine disorders: Secondary | ICD-10-CM

## 2024-09-19 DIAGNOSIS — E1169 Type 2 diabetes mellitus with other specified complication: Secondary | ICD-10-CM

## 2024-09-19 DIAGNOSIS — E559 Vitamin D deficiency, unspecified: Secondary | ICD-10-CM

## 2024-09-19 LAB — POCT GLYCOSYLATED HEMOGLOBIN (HGB A1C): HbA1c, POC (controlled diabetic range): 6.3 % (ref 0.0–7.0)

## 2024-09-19 MED ORDER — LISINOPRIL 20 MG PO TABS: 20.0000 mg | ORAL_TABLET | Freq: Every morning | ORAL | 1 refills | Status: AC

## 2024-09-19 MED ORDER — PIOGLITAZONE HCL 45 MG PO TABS: 45.0000 mg | ORAL_TABLET | Freq: Every day | ORAL | 1 refills | Status: AC

## 2024-09-19 MED ORDER — HYDROCHLOROTHIAZIDE 25 MG PO TABS: ORAL_TABLET | ORAL | 3 refills | Status: AC

## 2024-09-19 MED ORDER — ROSUVASTATIN CALCIUM 20 MG PO TABS: 20.0000 mg | ORAL_TABLET | Freq: Every day | ORAL | 3 refills | Status: AC

## 2024-09-19 MED ORDER — AMLODIPINE BESYLATE 10 MG PO TABS: 10.0000 mg | ORAL_TABLET | Freq: Every day | ORAL | 1 refills | Status: AC

## 2024-09-19 NOTE — Assessment & Plan Note (Signed)
Doing well with rosuvastatin.  Recommend continuation at current strength.   ?

## 2024-09-19 NOTE — Assessment & Plan Note (Signed)
 BP is well controlled Continue current medications

## 2024-09-19 NOTE — Progress Notes (Signed)
 Edward Riley - 58 y.o. male MRN [id number]  Date of birth: 02-Feb-1966  Subjective Chief Complaint  Patient presents with   Medical Management of Chronic Issues    Type 2 diabetes mellitus with diabetic nephropathy, without long-term current use of insulin  [facility])     HPI Edward Riley is a 58 y.o. male here today for follow up visit   Edward Riley reports that Edward Riley is doing pretty well.    Edward Riley remains on actos , farxiga  and mounjaro  for management of T2DM.  Edward Riley is doing pretty well with these.  Denies side effects at current strength.  Doing well with crestor  for associated HLD.   Edward Riley continues to see retina specialist for associated retinopathy as well.   BP is well controlled with amlodipine  and lisinopril .  Edward Riley is doing well with current medications and denies side effects. Denies chest pain, shortness of breath, palpitations, headache or vision changes.   ROS:  A comprehensive ROS was completed and negative except as noted per HPI    No Known Allergies  Past Medical History:  Diagnosis Date   Diabetes mellitus without complication (HCC)    Hyperlipidemia    Hypertension    Obese    Retinal hemorrhage, both eyes    Sessile colonic polyp 02/04/2019   Colonoscopy February 2020.    History reviewed. No pertinent surgical history.  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    Comment: [employer]  Tobacco Use   Smoking status: Never   Smokeless tobacco: Never  Vaping Use   Vaping status: Never Used  Substance and Sexual Activity   Alcohol use: Yes    Comment: Rarely   Drug use: No   Sexual activity: Yes  Other Topics Concern   Not on file  Social History Narrative   Marital Status: Single    Children: None   Pets: None    Living  Situation: Lives alone   Occupation: Naval architect Research officer, trade union)     Education:  12 th Grade    Tobacco Use/Exposure:  None    Alcohol Use:  Rarely   Drug Use:  None   Diet:  Regular   Exercise:  Very Active at work    Hobbies:  Teacher, early years/pre                Social Drivers of Corporate investment banker Strain: Low Risk  (09/21/2023)   Overall Financial Resource Strain (CARDIA)    Difficulty of Paying Living Expenses: Not hard at all  Food Insecurity: No Food Insecurity (09/21/2023)   Hunger Vital Sign    Worried About Running Out of Food in the Last Year: Never true    Ran Out of Food in the Last Year: Never true  Transportation Needs: No Transportation Needs (09/21/2023)   PRAPARE - Administrator, Civil Service (Medical): No    Lack of Transportation (Non-Medical): No  Physical Activity: Insufficiently Active (09/21/2023)   Exercise Vital Sign    Days of Exercise per Week: 4 days    Minutes of Exercise per Session: 30 min  Stress: No Stress Concern Present (09/21/2023)   [employer] of Occupational Health - Occupational Stress Questionnaire  Feeling of Stress : Not at all  Social Connections: Socially Isolated (09/21/2023)   Social Connection and Isolation Panel    Frequency of Communication with Friends and Family: More than three times a week    Frequency of Social Gatherings with Friends and Family: More than three times a week    Attends Religious Services: Never    Database administrator or Organizations: No    Attends Engineer, structural: Not on file    Marital Status: Never married    Family History  Problem Relation Age of Onset   Arthritis Mother    Hyperlipidemia Mother    Hypertension Mother    Cancer Father 37       Prostate (Metastatic) Lung, Esophagus, Stomach   Alcohol abuse Father    Kidney disease Brother    Hypertension Brother    Cancer Paternal Aunt        Breast Cancer   Alzheimer's disease Maternal  Grandmother    Heart disease Paternal Grandmother    Diabetes Brother    Alcohol abuse Maternal Uncle     Health Maintenance  Topic Date Due   Hepatitis B Vaccines 19-59 Average Risk (1 of 3 - 19+ 3-dose series) Never done   COVID-19 Vaccine (2 - 2025-26 season) 07/28/2024   Diabetic kidney evaluation - eGFR measurement  09/20/2024   Diabetic kidney evaluation - Urine ACR  09/20/2024   Hepatitis C Screening  03/21/2025 (Originally 04/04/1986)   HIV Screening  03/21/2025 (Originally 04/05/1983)   OPHTHALMOLOGY EXAM  01/03/2025   HEMOGLOBIN A1C  03/20/2025   FOOT EXAM  03/21/2025   DTaP/Tdap/Td (2 - Td or Tdap) 11/17/2026   Colonoscopy  01/24/2029   Pneumococcal Vaccine: 50+ Years  Completed   Influenza Vaccine  Completed   Zoster Vaccines- Shingrix   Completed   HPV VACCINES  Aged Out   Meningococcal B Vaccine  Aged Out     ----------------------------------------------------------------------------------------------------------------------------------------------------------------------------------------------------------------- Physical Exam BP (!) 141/68 (BP Location: Left Arm, Patient Position: Sitting, Cuff Size: Normal)   Pulse 83   Ht 5' 8 (1.727 m)   Wt 229 lb (103.9 kg)   SpO2 100%   BMI 34.82 kg/m   Physical Exam Constitutional:      Appearance: Normal appearance.  Eyes:     General: No scleral icterus. Cardiovascular:     Rate and Rhythm: Normal rate and regular rhythm.  Pulmonary:     Effort: Pulmonary effort is normal.     Breath sounds: Normal breath sounds.  Neurological:     Mental Status: Edward Riley is alert.  Psychiatric:        Mood and Affect: Mood normal.        Behavior: Behavior normal.     ------------------------------------------------------------------------------------------------------------------------------------------------------------------------------------------------------------------- Assessment and Plan  Type 2 diabetes mellitus with  diabetic nephropathy (HCC) Blood sugars are well controlled.  Continue mounjaro  at 15mg /week.  Continue with just farxiga .    Vitamin D  deficiency Recheck vitamin d  levels.  Hypertension associated with diabetes (HCC) BP is well controlled.  Continue current medications.   Hyperlipidemia associated with type 2 diabetes mellitus (HCC) Doing well with rosuvastatin .  Recommend continuation at current strength.     No orders of the defined types were placed in this encounter.   Return in about 6 months (around 03/20/2025) for Hypertension, Type 2 Diabetes.

## 2024-09-19 NOTE — Addendum Note (Signed)
 Addended by: Hally Colella E on: 09/19/2024 09:36 AM   Modules accepted: Orders

## 2024-09-19 NOTE — Assessment & Plan Note (Signed)
 Blood sugars are well controlled.  Continue mounjaro  at 15mg /week.  Continue with just farxiga .

## 2024-09-19 NOTE — Assessment & Plan Note (Signed)
Recheck vitamin d levels.  ?

## 2024-09-20 LAB — CMP14+EGFR
ALT: 16 IU/L (ref 0–44)
AST: 22 IU/L (ref 0–40)
Albumin: 4.9 g/dL (ref 3.8–4.9)
Alkaline Phosphatase: 65 IU/L (ref 47–123)
BUN/Creatinine Ratio: 18 (ref 9–20)
BUN: 23 mg/dL (ref 6–24)
Bilirubin Total: 0.5 mg/dL (ref 0.0–1.2)
CO2: 23 mmol/L (ref 20–29)
Calcium: 10.2 mg/dL (ref 8.7–10.2)
Chloride: 98 mmol/L (ref 96–106)
Creatinine, Ser: 1.26 mg/dL (ref 0.76–1.27)
Globulin, Total: 2.3 g/dL (ref 1.5–4.5)
Glucose: 126 mg/dL — ABNORMAL HIGH (ref 70–99)
Potassium: 4.4 mmol/L (ref 3.5–5.2)
Sodium: 136 mmol/L (ref 134–144)
Total Protein: 7.2 g/dL (ref 6.0–8.5)
eGFR: 67 mL/min/1.73 (ref >59)

## 2024-09-20 LAB — CBC WITH DIFFERENTIAL/PLATELET
Basophils Absolute: 0 x10E3/uL (ref 0.0–0.2)
Basos: 1 %
EOS (ABSOLUTE): 0.1 x10E3/uL (ref 0.0–0.4)
Eos: 2 %
Hematocrit: 46.3 % (ref 37.5–51.0)
Hemoglobin: 15.5 g/dL (ref 13.0–17.7)
Immature Grans (Abs): 0 x10E3/uL (ref 0.0–0.1)
Immature Granulocytes: 0 %
Lymphocytes Absolute: 1.3 x10E3/uL (ref 0.7–3.1)
Lymphs: 20 %
MCH: 31.4 pg (ref 26.6–33.0)
MCHC: 33.5 g/dL (ref 31.5–35.7)
MCV: 94 fL (ref 79–97)
Monocytes Absolute: 0.6 x10E3/uL (ref 0.1–0.9)
Monocytes: 10 %
Neutrophils Absolute: 4.3 x10E3/uL (ref 1.4–7.0)
Neutrophils: 67 %
Platelets: 241 x10E3/uL (ref 150–450)
RBC: 4.93 x10E6/uL (ref 4.14–5.80)
RDW: 12.7 % (ref 11.6–15.4)
WBC: 6.4 x10E3/uL (ref 3.4–10.8)

## 2024-09-20 LAB — LIPID PANEL WITH LDL/HDL RATIO
Cholesterol, Total: 228 mg/dL — ABNORMAL HIGH (ref 100–199)
HDL: 43 mg/dL (ref >39)
LDL Chol Calc (NIH): 158 mg/dL — ABNORMAL HIGH (ref 0–99)
LDL/HDL Ratio: 3.7 ratio — ABNORMAL HIGH (ref 0.0–3.6)
Triglycerides: 147 mg/dL (ref 0–149)
VLDL Cholesterol Cal: 27 mg/dL (ref 5–40)

## 2024-09-20 LAB — MICROALBUMIN / CREATININE URINE RATIO
Creatinine, Urine: 77.8 mg/dL
Microalb/Creat Ratio: 75 mg/g{creat} — ABNORMAL HIGH (ref 0–29)
Microalbumin, Urine: 58.7 ug/mL

## 2024-09-20 LAB — PSA: Prostate Specific Ag, Serum: 0.5 ng/mL (ref 0.0–4.0)

## 2024-09-20 LAB — VITAMIN D 25 HYDROXY (VIT D DEFICIENCY, FRACTURES): Vit D, 25-Hydroxy: 27.3 ng/mL — ABNORMAL LOW (ref 30.0–100.0)

## 2024-10-03 ENCOUNTER — Ambulatory Visit: Payer: Self-pay | Admitting: Family Medicine

## 2024-10-08 ENCOUNTER — Other Ambulatory Visit: Payer: Self-pay | Admitting: Family Medicine

## 2024-11-28 NOTE — Progress Notes (Shared)
 " [facility] - Clinic Note  12/05/2024     CHIEF COMPLAINT Patient presents for No chief complaint on file.   HISTORY OF PRESENT ILLNESS: Edward Riley is a 59 y.o. male who presents to the clinic today for:      Patient feels the vision is the same.   Referring physician: Alvia Bring, DO [address]  [address] [place],  [place] [address]  HISTORICAL INFORMATION:   Selected notes from the MEDICAL RECORD NUMBER Referred by Dr. Bring Alvia for diabetic eye exam LEE: [facility] Ocular Hx- PMH-    CURRENT MEDICATIONS: No current outpatient medications on file. (Ophthalmic Drugs)   No current facility-administered medications for this visit. (Ophthalmic Drugs)   Current Outpatient Medications (Other)  Medication Sig   AMBULATORY NON FORMULARY MEDICATION Bayer test strips and lancets. Use to check blood sugar twice a day. Dx: Type Two Diabetes Mellitus   amLODipine  (NORVASC ) 10 MG tablet Take 1 tablet (10 mg total) by mouth daily.   BD PEN NEEDLE NANO U/F 32G X 4 MM MISC USE AS DIRECTED   Blood Glucose Monitoring Suppl (ONE TOUCH ULTRA 2) w/Device KIT Use to check blood sugar twice a day. Send strips and lancets, quantity 100 refills 3.  Dx: Type Two Diabetes Mellitus   dapagliflozin  propanediol (FARXIGA ) 10 MG TABS tablet Take 1 tablet (10 mg total) by mouth daily before breakfast.   glucose blood (ONE TOUCH ULTRA TEST) test strip Use as instructed   hydrochlorothiazide  (HYDRODIURIL ) 25 MG tablet TAKE 1 TABLET BY MOUTH DAILY   Insulin  Pen Needle (PEN NEEDLES) 31G X 8 MM MISC Use the pen needles for both the insulin  and victoza  pens daily   Lancet Device MISC Check blood sugar once or twice daily as needed.   lisinopril  (ZESTRIL ) 20 MG tablet Take 1 tablet (20 mg total) by mouth every morning.   MOUNJARO  15 MG/0.5ML Pen INJECT 15 MG UNDER THE SKIN ONCE WEEKLY   pioglitazone  (ACTOS ) 45 MG tablet Take 1 tablet (45 mg total)  by mouth daily.   rosuvastatin  (CRESTOR ) 20 MG tablet Take 1 tablet (20 mg total) by mouth daily.   sildenafil  (VIAGRA ) 100 MG tablet TAKE 1/2 TO 1 TABLET BY MOUTH DAILY AS NEEDED   No current facility-administered medications for this visit. (Other)   REVIEW OF SYSTEMS:   ALLERGIES No Known Allergies  PAST MEDICAL HISTORY Past Medical History:  Diagnosis Date   Diabetes mellitus without complication (HCC)    Hyperlipidemia    Hypertension    Obese    Retinal hemorrhage, both eyes    Sessile colonic polyp 02/04/2019   Colonoscopy February 2020.   No past surgical history on file.  FAMILY HISTORY Family History  Problem Relation Age of Onset   Arthritis Mother    Hyperlipidemia Mother    Hypertension Mother    Cancer Father 96       Prostate (Metastatic) Lung, Esophagus, Stomach   Alcohol abuse Father    Kidney disease Brother    Hypertension Brother    Cancer Paternal Aunt        Breast Cancer   Alzheimer's disease Maternal Grandmother    Heart disease Paternal Grandmother    Diabetes Brother    Alcohol abuse Maternal Uncle    SOCIAL HISTORY Social History   Tobacco Use   Smoking status: Never   Smokeless tobacco: Never  Vaping Use   Vaping status: Never Used  Substance Use  Topics   Alcohol use: Yes    Comment: Rarely   Drug use: No       OPHTHALMIC EXAM: Not recorded    IMAGING AND PROCEDURES  Imaging and Procedures for 12/05/2024          ASSESSMENT/PLAN: No diagnosis found.  1-3. 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, OU (OD > OS)  - delayed f/u - 16 wks instead of 14 due to work travel  - delayed f/u - 14 wks instead of 12 due to weather on 02.07.25 - delayed f/u - 15.5 wks instead of 12 due to work schedule on 08.07.24  - last A1c 6.4 on 10.25.24 - pt with history of PRP laser OU in [place] ~10+ yrs ago -- pt can't remember provider - exam shows scattered MA/IRH and 360 PRP, room for fill in OU - FA (9.19.22) shows  late-leaking MA, vascular nonperfusion, +NV OU -- will need PRP fill in OU - s/p PRP OD (09.29.22) - s/p PRP OS (11.14.22) - s/p IVA OD #1 (09.19.22) - s/p IVA OS #1 (09.23.22) - s/p IVA OU #2 (10.21.22), #3 (12.23.22), #4 (01.06.23), #5 (02.03.23), #6 (03.03.23), #7 (04.07.23), #8 (05.19.23), #9 (07.14.23), #10 (09.01.23), #11 (10.20.23) #12 (12.08.23), #13 (02.09.24), 14 (04.19.24), #15 (08.07.24), #16 (11.01.24), #17 (02.07.25) #18 (05.16.25) #19(09.05.25) **interval worsening of IRF at 8 weeks noted on 07.14.23 exam** - BCVA OD: 20/25 -- stable, OS: 20/20 -- stable - OCT shows OD: OD: Trace cystic changes nasal macula and IN fovea -- slightly increase, partial PVD, OS: Trace cystic changes temporal mac -- slightly increased at 16 weeks - recommend IVA OU #20 today, 01.09.26 for DME with f/u in 14-16 weeks - pt wishes to proceed - RBA of procedure discussed, questions answered - informed consent obtained and signed  - see procedure notes - f/u 14-16 weeks DFE/OCT, possible injection(s)  4,5. Hypertensive retinopathy OU - discussed importance of tight BP control -  monitor for now  6. Mixed Cataract OU - The symptoms of cataract, surgical options, and treatments and risks were discussed with patient. - discussed diagnosis and progression - monitor  Ophthalmic Meds Ordered this visit:  No orders of the defined types were placed in this encounter.    No follow-ups on file.  There are no Patient Instructions on file for this visit.   This document serves as a record of services personally performed by Redell JUDITHANN Hans, MD, PhD. It was created on their behalf by Delon Newness COT, an ophthalmic technician. The creation of this record is the provider's dictation and/or activities during the visit.    Electronically signed by: Delon Newness COT 01.02.25 10:38 AM   Abbreviations: M myopia (nearsighted); A astigmatism; H hyperopia (farsighted); P presbyopia; Mrx spectacle  prescription;  CTL contact lenses; OD right eye; OS left eye; OU both eyes  XT exotropia; ET esotropia; PEK punctate epithelial keratitis; PEE punctate epithelial erosions; DES dry eye syndrome; MGD meibomian gland dysfunction; ATs artificial tears; PFAT's preservative free artificial tears; NSC nuclear sclerotic cataract; PSC posterior subcapsular cataract; ERM epi-retinal membrane; PVD posterior vitreous detachment; RD retinal detachment; DM diabetes mellitus; DR diabetic retinopathy; NPDR non-proliferative diabetic retinopathy; PDR proliferative diabetic retinopathy; CSME clinically significant macular edema; DME diabetic macular edema; dbh dot blot hemorrhages; CWS cotton wool spot; POAG primary open angle glaucoma; C/D cup-to-disc ratio; HVF humphrey visual field; GVF goldmann visual field; OCT optical coherence tomography; IOP intraocular pressure; BRVO Branch retinal vein occlusion; CRVO central retinal vein occlusion; CRAO central retinal artery  occlusion; BRAO branch retinal artery occlusion; RT retinal tear; SB scleral buckle; PPV pars plana vitrectomy; VH Vitreous hemorrhage; PRP panretinal laser photocoagulation; IVK intravitreal kenalog ; VMT vitreomacular traction; MH Macular hole;  NVD neovascularization of the disc; NVE neovascularization elsewhere; AREDS age related eye disease study; ARMD age related macular degeneration; POAG primary open angle glaucoma; EBMD epithelial/anterior basement membrane dystrophy; ACIOL anterior chamber intraocular lens; IOL intraocular lens; PCIOL posterior chamber intraocular lens; Phaco/IOL phacoemulsification with intraocular lens placement; PRK photorefractive keratectomy; LASIK laser assisted in situ keratomileusis; HTN hypertension; DM diabetes mellitus; COPD chronic obstructive pulmonary disease "

## 2024-12-05 ENCOUNTER — Encounter (INDEPENDENT_AMBULATORY_CARE_PROVIDER_SITE_OTHER): Admitting: Ophthalmology

## 2024-12-05 DIAGNOSIS — H25813 Combined forms of age-related cataract, bilateral: Secondary | ICD-10-CM

## 2024-12-05 DIAGNOSIS — Z7984 Long term (current) use of oral hypoglycemic drugs: Secondary | ICD-10-CM

## 2024-12-05 DIAGNOSIS — I10 Essential (primary) hypertension: Secondary | ICD-10-CM

## 2024-12-05 DIAGNOSIS — Z7985 Long-term (current) use of injectable non-insulin antidiabetic drugs: Secondary | ICD-10-CM

## 2024-12-05 DIAGNOSIS — E113513 Type 2 diabetes mellitus with proliferative diabetic retinopathy with macular edema, bilateral: Secondary | ICD-10-CM

## 2024-12-05 DIAGNOSIS — H35033 Hypertensive retinopathy, bilateral: Secondary | ICD-10-CM

## 2024-12-11 ENCOUNTER — Other Ambulatory Visit: Payer: Self-pay | Admitting: Family Medicine

## 2024-12-11 DIAGNOSIS — E1121 Type 2 diabetes mellitus with diabetic nephropathy: Secondary | ICD-10-CM

## 2024-12-17 NOTE — Progress Notes (Signed)
 " [facility] - Clinic Note  12/19/2024     CHIEF COMPLAINT Patient presents for Retina Follow Up   HISTORY OF PRESENT ILLNESS: Edward Riley is a 59 y.o. male who presents to the clinic today for:   HPI     Retina Follow Up   Patient presents with  Diabetic Retinopathy.  This started 16 weeks ago.  Duration of 16 weeks.  Since onset it is stable.  I, the attending physician,  performed the HPI with the patient and updated documentation appropriately.        Comments   16 week retina follow up PDR OU and IVA OU pt is reporting no vision changes noticed he denies any flashes or floaters pt last A1C 6.4 pt is not checking his blood sugar       Last edited by Valdemar Rogue, MD on 12/19/2024 12:49 PM.     Patient feels the vision is about the same.   Referring physician: Alvia Bring, DO [address]  [address] [place],  [place] [address]  HISTORICAL INFORMATION:   Selected notes from the MEDICAL RECORD NUMBER Referred by Dr. Bring Alvia for diabetic eye exam LEE: [facility] Ocular Hx- PMH-    CURRENT MEDICATIONS: No current outpatient medications on file. (Ophthalmic Drugs)   No current facility-administered medications for this visit. (Ophthalmic Drugs)   Current Outpatient Medications (Other)  Medication Sig   AMBULATORY NON FORMULARY MEDICATION Bayer test strips and lancets. Use to check blood sugar twice a day. Dx: Type Two Diabetes Mellitus   amLODipine  (NORVASC ) 10 MG tablet Take 1 tablet (10 mg total) by mouth daily.   BD PEN NEEDLE NANO U/F 32G X 4 MM MISC USE AS DIRECTED   Blood Glucose Monitoring Suppl (ONE TOUCH ULTRA 2) w/Device KIT Use to check blood sugar twice a day. Send strips and lancets, quantity 100 refills 3.  Dx: Type Two Diabetes Mellitus   dapagliflozin  propanediol (FARXIGA ) 10 MG TABS tablet Take 1 tablet (10 mg total) by mouth daily before breakfast.   glucose blood (ONE TOUCH ULTRA TEST)  test strip Use as instructed   hydrochlorothiazide  (HYDRODIURIL ) 25 MG tablet TAKE 1 TABLET BY MOUTH DAILY   Insulin  Pen Needle (PEN NEEDLES) 31G X 8 MM MISC Use the pen needles for both the insulin  and victoza  pens daily   Lancet Device MISC Check blood sugar once or twice daily as needed.   lisinopril  (ZESTRIL ) 20 MG tablet Take 1 tablet (20 mg total) by mouth every morning.   pioglitazone  (ACTOS ) 45 MG tablet Take 1 tablet (45 mg total) by mouth daily.   rosuvastatin  (CRESTOR ) 20 MG tablet Take 1 tablet (20 mg total) by mouth daily.   sildenafil  (VIAGRA ) 100 MG tablet TAKE 1/2 TO 1 TABLET BY MOUTH DAILY AS NEEDED   tirzepatide  (MOUNJARO ) 15 MG/0.5ML Pen INJECT 15 MG UNDER THE SKIN ONCE WEEKLY   No current facility-administered medications for this visit. (Other)   REVIEW OF SYSTEMS: ROS   Positive for: Endocrine, Cardiovascular, Eyes Negative for: Constitutional, Gastrointestinal, Neurological, Skin, Genitourinary, Musculoskeletal, HENT, Respiratory, Psychiatric, Allergic/Imm, Heme/Lymph Last edited by Resa Delon ORN, COT on 12/19/2024  9:34 AM.      ALLERGIES No Known Allergies  PAST MEDICAL HISTORY Past Medical History:  Diagnosis Date   Diabetes mellitus without complication (HCC)    Hyperlipidemia    Hypertension    Obese    Retinal hemorrhage, both eyes    Sessile colonic  polyp 02/04/2019   Colonoscopy February 2020.   History reviewed. No pertinent surgical history.  FAMILY HISTORY Family History  Problem Relation Age of Onset   Arthritis Mother    Hyperlipidemia Mother    Hypertension Mother    Cancer Father 62       Prostate (Metastatic) Lung, Esophagus, Stomach   Alcohol abuse Father    Kidney disease Brother    Hypertension Brother    Cancer Paternal Aunt        Breast Cancer   Alzheimer's disease Maternal Grandmother    Heart disease Paternal Grandmother    Diabetes Brother    Alcohol abuse Maternal Uncle    SOCIAL HISTORY Social History    Tobacco Use   Smoking status: Never   Smokeless tobacco: Never  Vaping Use   Vaping status: Never Used  Substance Use Topics   Alcohol use: Yes    Comment: Rarely   Drug use: No       OPHTHALMIC EXAM: Base Eye Exam     Visual Acuity (Snellen - Linear)       Right Left   Dist cc 20/25 -2 20/30 -2   Dist ph cc NI 20/25 -3         Tonometry (Tonopen, 9:40 AM)       Right Left   Pressure 15 16         Pupils       Pupils Dark Light Shape React APD   Right PERRL 3 2 Round Brisk None   Left PERRL 3 2 Round Brisk None         Visual Fields       Left Right    Full Full         Extraocular Movement       Right Left    Full, Ortho Full, Ortho         Neuro/Psych     Oriented x3: Yes   Mood/Affect: Normal         Dilation     Both eyes: 2.5% Phenylephrine @ 9:40 AM           Slit Lamp and Fundus Exam     Slit Lamp Exam       Right Left   Lids/Lashes Dermatochalasis - upper lid Dermatochalasis - upper lid   Conjunctiva/Sclera nasal pingeucula nasal and temporal pinguecula   Cornea trace PEE trace PEE   Anterior Chamber Deep and quiet Deep and quiet   Iris Round and dilated, No NVI Round and dilated, No NVI   Lens 2+ Nuclear sclerosis, 2+ Cortical cataract 2+ Nuclear sclerosis, 2+ Cortical cataract   Anterior Vitreous Mild Vitreous syneresis Mild Vitreous syneresis         Fundus Exam       Right Left   Disc Pink and Sharp, PPA, Compact mild tilt, Pink and Sharp, PPA   C/D Ratio 0.2 0.2   Macula good foveal reflex, trace MA, no exudates, trace perifoveal cystic changes -- improved Good foveal reflex, Microaneurysms -- stably improved, trace cystic changes temporal macula   Vessels +fibrosis / ?NV temporal periphery-- regressing, mild attenuation, mild tortuosity trace focal fibrosis along distal IT arcades, attenuated, mild tortuosity   Periphery Attached, 360 PRP with good laser fill in, rare DBH temporal periphery -- improved  Attached, 360 PRP with good fill in, rare IRH/DBH           Refraction     Wearing Rx  Sphere Cylinder Axis   Right -1.75 +0.75 146   Left -1.50 +0.25 162           IMAGING AND PROCEDURES  Imaging and Procedures for 12/19/2024  OCT, Retina - OU - Both Eyes       Right Eye Quality was good. Central Foveal Thickness: 346. Progression has improved. Findings include normal foveal contour, no SRF, intraretinal fluid, vitreomacular adhesion (Trace cystic changes nasal macula and IN fovea -- improved, partial PVD).   Left Eye Quality was good. Central Foveal Thickness: 349. Progression has been stable. Findings include normal foveal contour, no SRF, vitreomacular adhesion (Trace cystic changes temporal mac ).   Notes *Images captured and stored on drive  Diagnosis / Impression:  +DME OU OD: Trace cystic changes nasal macula and IN fovea -- improved, partial PVD OS: Trace cystic changes temporal mac  Clinical management:  See below  Abbreviations: NFP - Normal foveal profile. CME - cystoid macular edema. PED - pigment epithelial detachment. IRF - intraretinal fluid. SRF - subretinal fluid. EZ - ellipsoid zone. ERM - epiretinal membrane. ORA - outer retinal atrophy. ORT - outer retinal tubulation. SRHM - subretinal hyper-reflective material. IRHM - intraretinal hyper-reflective material      Intravitreal Injection, Pharmacologic Agent - OD - Right Eye       Time Out 12/19/2024. 10:47 AM. Confirmed correct patient, procedure, site, and patient consented.   Anesthesia Topical anesthesia was used. Anesthetic medications included Lidocaine 2%, Proparacaine 0.5%.   Procedure Preparation included 5% betadine to ocular surface, eyelid speculum. A supplied (32g) needle was used.   Injection: 1.25 mg Bevacizumab  1.25mg /0.44ml   Route: Intravitreal, Site: Right Eye   NDC: 49757-939-98, Lot: [id number]$ , Expiration date: 01/19/2025   Post-op Post injection exam found  visual acuity of at least counting fingers. The patient tolerated the procedure well. There were no complications. The patient received written and verbal post procedure care education. Post injection medications were not given.      Intravitreal Injection, Pharmacologic Agent - OS - Left Eye       Time Out 12/19/2024. 10:47 AM. Confirmed correct patient, procedure, site, and patient consented.   Anesthesia Topical anesthesia was used. Anesthetic medications included Lidocaine 2%, Proparacaine 0.5%.   Procedure Preparation included 5% betadine to ocular surface, eyelid speculum. A supplied (33g) needle was used.   Injection: 1.25 mg Bevacizumab  1.25mg /0.59ml   Route: Intravitreal, Site: Left Eye   NDC: [id number], Lot: [id number], Expiration date: 01/15/2025   Post-op Post injection exam found visual acuity of at least counting fingers. The patient tolerated the procedure well. There were no complications. The patient received written and verbal post procedure care education. Post injection medications were not given.             ASSESSMENT/PLAN:   ICD-10-CM   1. Proliferative diabetic retinopathy of both eyes with macular edema associated with type 2 diabetes mellitus (HCC)  E11.3513 OCT, Retina - OU - Both Eyes    Intravitreal Injection, Pharmacologic Agent - OD - Right Eye    Intravitreal Injection, Pharmacologic Agent - OS - Left Eye    Bevacizumab  (AVASTIN ) SOLN 1.25 mg    Bevacizumab  (AVASTIN ) SOLN 1.25 mg    2. Long term (current) use of oral hypoglycemic drugs  Z79.84     3. Long-term (current) use of injectable non-insulin  antidiabetic drugs  Z79.85     4. Essential hypertension  I10     5. Hypertensive retinopathy of both eyes  H35.033  6. Combined forms of age-related cataract of both eyes  H25.813      1-3. 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, OU (OD > OS)  - delayed f/u 4+ mos instead of 16 wks on 01.23.26  - delayed f/u - 16 wks instead of 14 due  to work travel  - delayed f/u - 14 wks instead of 12 due to weather on 02.07.25 - delayed f/u - 15.5 wks instead of 12 due to work schedule on 08.07.24 **interval worsening of IRF at 8 weeks noted on 07.14.23 exam** - BCVA OD: 20/25, OS: 20/25 from 20/20   - last A1c 6.4 on 10.25.24 - pt with history of PRP laser OU in [place] ~10+ yrs ago -- pt can't remember provider - exam shows scattered MA/IRH and 360 PRP, room for fill in OU - FA (9.19.22) shows late-leaking MA, vascular nonperfusion, +NV OU -- will need PRP fill in OU - s/p PRP OD (09.29.22) - s/p PRP OS (11.14.22) - s/p IVA OD #1 (09.19.22), s/p IVA OS #1 (09.23.22) - s/p IVA OU #2 (10.21.22), #3 (12.23.22), #4 (01.06.23), #5 (02.03.23), #6 (03.03.23), #7 (04.07.23), #8 (05.19.23), #9 (07.14.23), #10 (09.01.23), #11 (10.20.23), #12 (12.08.23), #13 (02.09.24), #14 (04.19.24), #15 (08.07.24), #16 (11.01.24), #17 (02.07.25), #18 (05.16.25), #19 (09.05.25) - BCVA OD 20/25 - stable; OS 20/25 from 20/20 - OCT shows OD: Trace cystic changes nasal macula and IN fovea -- improved, partial PVD, OS: Trace cystic changes temporal mac at 20 weeks - recommend IVA OU #20 today, 01.23.26 for DME with f/u in 14-16 weeks - pt wishes to proceed - RBA of procedure discussed, questions answered - informed consent obtained and signed 01.23.26 - see procedure notes - f/u 14-16 weeks DFE/OCT, possible injection(s)  4,5. Hypertensive retinopathy OU - discussed importance of tight BP control -  monitor for now  6. Mixed Cataract OU - The symptoms of cataract, surgical options, and treatments and risks were discussed with patient. - discussed diagnosis and progression - monitor  Ophthalmic Meds Ordered this visit:  Meds ordered this encounter  Medications   Bevacizumab  (AVASTIN ) SOLN 1.25 mg   Bevacizumab  (AVASTIN ) SOLN 1.25 mg     Return in about 16 weeks (around 04/10/2025) for f/u, PDR, DFE, OCT, Possible, IVA, OU.  There are no Patient  Instructions on file for this visit.   This document serves as a record of services personally performed by Redell JUDITHANN Hans, MD, PhD. It was created on their behalf by Delon Newness COT, an ophthalmic technician. The creation of this record is the provider's dictation and/or activities during the visit.    This document serves as a record of services personally performed by Redell JUDITHANN Hans, MD, PhD. It was created on their behalf by Delon Newness COT, an ophthalmic technician. The creation of this record is the provider's dictation and/or activities during the visit.    Electronically signed by: Delon Newness COT 01.21.26 11:54 PM  This document serves as a record of services personally performed by Redell JUDITHANN Hans, MD, PhD. It was created on their behalf by Wanda GEANNIE Keens, COT an ophthalmic technician. The creation of this record is the provider's dictation and/or activities during the visit.    Electronically signed by:  Wanda GEANNIE Keens, COT  12/25/24 11:54 PM  Redell JUDITHANN Hans, M.D., Ph.D. Diseases & Surgery of the Retina and Vitreous [facility] 12/19/2024   I have reviewed the above documentation for accuracy and completeness, and I agree with  the above. Redell JUDITHANN Hans, M.D., Ph.D. 12/25/24 11:54 PM   Abbreviations: M myopia (nearsighted); A astigmatism; H hyperopia (farsighted); P presbyopia; Mrx spectacle prescription;  CTL contact lenses; OD right eye; OS left eye; OU both eyes  XT exotropia; ET esotropia; PEK punctate epithelial keratitis; PEE punctate epithelial erosions; DES dry eye syndrome; MGD meibomian gland dysfunction; ATs artificial tears; PFAT's preservative free artificial tears; NSC nuclear sclerotic cataract; PSC posterior subcapsular cataract; ERM epi-retinal membrane; PVD posterior vitreous detachment; RD retinal detachment; DM diabetes mellitus; DR diabetic retinopathy; NPDR non-proliferative diabetic retinopathy; PDR  proliferative diabetic retinopathy; CSME clinically significant macular edema; DME diabetic macular edema; dbh dot blot hemorrhages; CWS cotton wool spot; POAG primary open angle glaucoma; C/D cup-to-disc ratio; HVF humphrey visual field; GVF goldmann visual field; OCT optical coherence tomography; IOP intraocular pressure; BRVO Branch retinal vein occlusion; CRVO central retinal vein occlusion; CRAO central retinal artery occlusion; BRAO branch retinal artery occlusion; RT retinal tear; SB scleral buckle; PPV pars plana vitrectomy; VH Vitreous hemorrhage; PRP panretinal laser photocoagulation; IVK intravitreal kenalog ; VMT vitreomacular traction; MH Macular hole;  NVD neovascularization of the disc; NVE neovascularization elsewhere; AREDS age related eye disease study; ARMD age related macular degeneration; POAG primary open angle glaucoma; EBMD epithelial/anterior basement membrane dystrophy; ACIOL anterior chamber intraocular lens; IOL intraocular lens; PCIOL posterior chamber intraocular lens; Phaco/IOL phacoemulsification with intraocular lens placement; PRK photorefractive keratectomy; LASIK laser assisted in situ keratomileusis; HTN hypertension; DM diabetes mellitus; COPD chronic obstructive pulmonary disease "

## 2024-12-19 ENCOUNTER — Ambulatory Visit (INDEPENDENT_AMBULATORY_CARE_PROVIDER_SITE_OTHER): Admitting: Ophthalmology

## 2024-12-19 ENCOUNTER — Encounter (INDEPENDENT_AMBULATORY_CARE_PROVIDER_SITE_OTHER): Payer: Self-pay | Admitting: Ophthalmology

## 2024-12-19 DIAGNOSIS — I10 Essential (primary) hypertension: Secondary | ICD-10-CM | POA: Diagnosis not present

## 2024-12-19 DIAGNOSIS — H35033 Hypertensive retinopathy, bilateral: Secondary | ICD-10-CM | POA: Diagnosis not present

## 2024-12-19 DIAGNOSIS — Z7984 Long term (current) use of oral hypoglycemic drugs: Secondary | ICD-10-CM

## 2024-12-19 DIAGNOSIS — H25813 Combined forms of age-related cataract, bilateral: Secondary | ICD-10-CM

## 2024-12-19 DIAGNOSIS — E113513 Type 2 diabetes mellitus with proliferative diabetic retinopathy with macular edema, bilateral: Secondary | ICD-10-CM | POA: Diagnosis not present

## 2024-12-19 DIAGNOSIS — Z7985 Long-term (current) use of injectable non-insulin antidiabetic drugs: Secondary | ICD-10-CM

## 2024-12-19 MED ORDER — BEVACIZUMAB CHEMO INJECTION 1.25MG/0.05ML SYRINGE FOR KALEIDOSCOPE
1.2500 mg | INTRAVITREAL | Status: AC | PRN
  Administered 2024-12-19: 1.25 mg via INTRAVITREAL

## 2025-03-20 ENCOUNTER — Ambulatory Visit: Admitting: Family Medicine

## 2025-04-10 ENCOUNTER — Encounter (INDEPENDENT_AMBULATORY_CARE_PROVIDER_SITE_OTHER): Admitting: Ophthalmology
